# Patient Record
Sex: Female | Born: 1960 | Race: Black or African American | Hispanic: No | Marital: Married | Smoking: Former smoker
Health system: Southern US, Community
[De-identification: ages and names within clinical notes are randomized; demographics above are authoritative.]

## PROBLEM LIST (undated history)

## (undated) DIAGNOSIS — E8881 Metabolic syndrome: Secondary | ICD-10-CM

## (undated) DIAGNOSIS — Z923 Personal history of irradiation: Secondary | ICD-10-CM

## (undated) DIAGNOSIS — C50919 Malignant neoplasm of unspecified site of unspecified female breast: Secondary | ICD-10-CM

## (undated) DIAGNOSIS — I10 Essential (primary) hypertension: Secondary | ICD-10-CM

## (undated) DIAGNOSIS — Z9221 Personal history of antineoplastic chemotherapy: Secondary | ICD-10-CM

## (undated) DIAGNOSIS — F32A Depression, unspecified: Secondary | ICD-10-CM

## (undated) DIAGNOSIS — E669 Obesity, unspecified: Secondary | ICD-10-CM

## (undated) DIAGNOSIS — Z5189 Encounter for other specified aftercare: Secondary | ICD-10-CM

## (undated) DIAGNOSIS — F329 Major depressive disorder, single episode, unspecified: Secondary | ICD-10-CM

## (undated) DIAGNOSIS — E785 Hyperlipidemia, unspecified: Secondary | ICD-10-CM

## (undated) DIAGNOSIS — F419 Anxiety disorder, unspecified: Secondary | ICD-10-CM

## (undated) DIAGNOSIS — M1990 Unspecified osteoarthritis, unspecified site: Secondary | ICD-10-CM

## (undated) HISTORY — DX: Anxiety disorder, unspecified: F41.9

## (undated) HISTORY — DX: Major depressive disorder, single episode, unspecified: F32.9

## (undated) HISTORY — DX: Malignant neoplasm of unspecified site of unspecified female breast: C50.919

## (undated) HISTORY — DX: Depression, unspecified: F32.A

## (undated) HISTORY — PX: TONSILLECTOMY: SUR1361

## (undated) HISTORY — DX: Obesity, unspecified: E66.9

## (undated) HISTORY — DX: Metabolic syndrome: E88.810

## (undated) HISTORY — DX: Hyperlipidemia, unspecified: E78.5

## (undated) HISTORY — PX: CARPAL TUNNEL RELEASE: SHX101

## (undated) HISTORY — DX: Encounter for other specified aftercare: Z51.89

## (undated) HISTORY — DX: Metabolic syndrome: E88.81

## (undated) HISTORY — DX: Essential (primary) hypertension: I10

## (undated) HISTORY — DX: Unspecified osteoarthritis, unspecified site: M19.90

## (undated) HISTORY — PX: OTHER SURGICAL HISTORY: SHX169

---

## 1998-10-31 ENCOUNTER — Other Ambulatory Visit: Admission: RE | Admit: 1998-10-31 | Discharge: 1998-10-31 | Payer: Self-pay | Admitting: Obstetrics & Gynecology

## 2000-07-26 ENCOUNTER — Other Ambulatory Visit: Admission: RE | Admit: 2000-07-26 | Discharge: 2000-07-26 | Payer: Self-pay | Admitting: Obstetrics and Gynecology

## 2000-10-08 ENCOUNTER — Encounter (INDEPENDENT_AMBULATORY_CARE_PROVIDER_SITE_OTHER): Payer: Self-pay

## 2000-10-08 ENCOUNTER — Ambulatory Visit (HOSPITAL_COMMUNITY): Admission: RE | Admit: 2000-10-08 | Discharge: 2000-10-08 | Payer: Self-pay | Admitting: Obstetrics and Gynecology

## 2001-07-20 ENCOUNTER — Other Ambulatory Visit: Admission: RE | Admit: 2001-07-20 | Discharge: 2001-07-20 | Payer: Self-pay | Admitting: Obstetrics and Gynecology

## 2001-07-25 ENCOUNTER — Encounter: Payer: Self-pay | Admitting: Gastroenterology

## 2001-07-25 ENCOUNTER — Ambulatory Visit: Admission: RE | Admit: 2001-07-25 | Discharge: 2001-07-25 | Payer: Self-pay | Admitting: Gastroenterology

## 2001-09-08 ENCOUNTER — Encounter: Payer: Self-pay | Admitting: Obstetrics and Gynecology

## 2001-09-08 ENCOUNTER — Ambulatory Visit (HOSPITAL_COMMUNITY): Admission: RE | Admit: 2001-09-08 | Discharge: 2001-09-08 | Payer: Self-pay | Admitting: Obstetrics and Gynecology

## 2002-10-26 ENCOUNTER — Other Ambulatory Visit: Admission: RE | Admit: 2002-10-26 | Discharge: 2002-10-26 | Payer: Self-pay | Admitting: Obstetrics and Gynecology

## 2002-11-07 ENCOUNTER — Encounter (INDEPENDENT_AMBULATORY_CARE_PROVIDER_SITE_OTHER): Payer: Self-pay | Admitting: Specialist

## 2002-11-07 ENCOUNTER — Ambulatory Visit (HOSPITAL_COMMUNITY): Admission: RE | Admit: 2002-11-07 | Discharge: 2002-11-07 | Payer: Self-pay | Admitting: Gastroenterology

## 2002-12-28 DIAGNOSIS — IMO0001 Reserved for inherently not codable concepts without codable children: Secondary | ICD-10-CM

## 2002-12-28 DIAGNOSIS — Z5189 Encounter for other specified aftercare: Secondary | ICD-10-CM

## 2002-12-28 HISTORY — DX: Encounter for other specified aftercare: Z51.89

## 2002-12-28 HISTORY — DX: Reserved for inherently not codable concepts without codable children: IMO0001

## 2003-12-06 ENCOUNTER — Ambulatory Visit (HOSPITAL_COMMUNITY): Admission: RE | Admit: 2003-12-06 | Discharge: 2003-12-06 | Payer: Self-pay | Admitting: Obstetrics and Gynecology

## 2003-12-25 ENCOUNTER — Ambulatory Visit (HOSPITAL_COMMUNITY): Admission: RE | Admit: 2003-12-25 | Discharge: 2003-12-25 | Payer: Self-pay | Admitting: Obstetrics and Gynecology

## 2003-12-29 HISTORY — PX: TOTAL ABDOMINAL HYSTERECTOMY: SHX209

## 2004-01-14 ENCOUNTER — Observation Stay (HOSPITAL_COMMUNITY): Admission: AD | Admit: 2004-01-14 | Discharge: 2004-01-14 | Payer: Self-pay | Admitting: Obstetrics and Gynecology

## 2004-01-17 ENCOUNTER — Encounter (INDEPENDENT_AMBULATORY_CARE_PROVIDER_SITE_OTHER): Payer: Self-pay | Admitting: Specialist

## 2004-01-17 ENCOUNTER — Inpatient Hospital Stay (HOSPITAL_COMMUNITY): Admission: RE | Admit: 2004-01-17 | Discharge: 2004-01-20 | Payer: Self-pay | Admitting: Obstetrics and Gynecology

## 2004-03-13 ENCOUNTER — Ambulatory Visit (HOSPITAL_COMMUNITY): Admission: RE | Admit: 2004-03-13 | Discharge: 2004-03-13 | Payer: Self-pay | Admitting: Obstetrics and Gynecology

## 2004-03-28 ENCOUNTER — Ambulatory Visit (HOSPITAL_COMMUNITY): Admission: RE | Admit: 2004-03-28 | Discharge: 2004-03-28 | Payer: Self-pay | Admitting: Obstetrics and Gynecology

## 2004-05-13 ENCOUNTER — Ambulatory Visit (HOSPITAL_COMMUNITY): Admission: RE | Admit: 2004-05-13 | Discharge: 2004-05-13 | Payer: Self-pay | Admitting: Obstetrics and Gynecology

## 2005-02-20 ENCOUNTER — Ambulatory Visit (HOSPITAL_COMMUNITY): Admission: RE | Admit: 2005-02-20 | Discharge: 2005-02-20 | Payer: Self-pay | Admitting: Obstetrics and Gynecology

## 2005-02-20 ENCOUNTER — Ambulatory Visit: Payer: Self-pay | Admitting: Family Medicine

## 2005-08-05 ENCOUNTER — Ambulatory Visit: Payer: Self-pay | Admitting: Family Medicine

## 2006-01-29 ENCOUNTER — Ambulatory Visit: Payer: Self-pay | Admitting: Family Medicine

## 2006-03-05 ENCOUNTER — Ambulatory Visit (HOSPITAL_COMMUNITY): Admission: RE | Admit: 2006-03-05 | Discharge: 2006-03-05 | Payer: Self-pay | Admitting: Obstetrics and Gynecology

## 2006-04-21 ENCOUNTER — Ambulatory Visit: Payer: Self-pay | Admitting: Family Medicine

## 2006-04-27 ENCOUNTER — Ambulatory Visit: Payer: Self-pay | Admitting: Family Medicine

## 2006-05-25 ENCOUNTER — Ambulatory Visit: Payer: Self-pay | Admitting: Family Medicine

## 2006-12-15 ENCOUNTER — Ambulatory Visit: Payer: Self-pay | Admitting: Family Medicine

## 2007-03-21 ENCOUNTER — Ambulatory Visit (HOSPITAL_COMMUNITY): Admission: RE | Admit: 2007-03-21 | Discharge: 2007-03-21 | Payer: Self-pay | Admitting: Obstetrics and Gynecology

## 2007-05-18 DIAGNOSIS — R05 Cough: Secondary | ICD-10-CM

## 2007-05-18 DIAGNOSIS — R059 Cough, unspecified: Secondary | ICD-10-CM | POA: Insufficient documentation

## 2007-07-22 ENCOUNTER — Ambulatory Visit: Payer: Self-pay | Admitting: Family Medicine

## 2007-07-22 DIAGNOSIS — E8881 Metabolic syndrome: Secondary | ICD-10-CM | POA: Insufficient documentation

## 2007-07-22 DIAGNOSIS — I10 Essential (primary) hypertension: Secondary | ICD-10-CM | POA: Insufficient documentation

## 2007-11-23 ENCOUNTER — Ambulatory Visit: Payer: Self-pay | Admitting: Family Medicine

## 2007-11-23 ENCOUNTER — Other Ambulatory Visit: Admission: RE | Admit: 2007-11-23 | Discharge: 2007-11-23 | Payer: Self-pay | Admitting: Obstetrics and Gynecology

## 2007-11-23 LAB — CONVERTED CEMR LAB
ALT: 19 units/L (ref 0–35)
Alkaline Phosphatase: 69 units/L (ref 39–117)
BUN: 10 mg/dL (ref 6–23)
Basophils Relative: 0.7 % (ref 0.0–1.0)
CO2: 33 meq/L — ABNORMAL HIGH (ref 19–32)
Calcium: 10 mg/dL (ref 8.4–10.5)
Chloride: 102 meq/L (ref 96–112)
Cholesterol: 224 mg/dL (ref 0–200)
Creatinine, Ser: 0.8 mg/dL (ref 0.4–1.2)
HDL: 40 mg/dL (ref >39.0)
Hemoglobin: 13.2 g/dL (ref 12.0–15.0)
Monocytes Relative: 8.6 % (ref 3.0–11.0)
Platelets: 309 10*3/uL (ref 150–400)
RBC: 4.02 M/uL (ref 3.87–5.11)
RDW: 12.1 % (ref 11.5–14.6)
Total Bilirubin: 1.1 mg/dL (ref 0.3–1.2)
Total CHOL/HDL Ratio: 5.6
Total Protein: 7.1 g/dL (ref 6.0–8.3)
Urobilinogen, UA: 2
VLDL: 13 mg/dL (ref 0–40)

## 2007-12-02 ENCOUNTER — Ambulatory Visit: Payer: Self-pay | Admitting: Family Medicine

## 2008-05-08 ENCOUNTER — Ambulatory Visit (HOSPITAL_COMMUNITY): Admission: RE | Admit: 2008-05-08 | Discharge: 2008-05-08 | Payer: Self-pay | Admitting: Obstetrics and Gynecology

## 2008-07-24 ENCOUNTER — Ambulatory Visit: Payer: Self-pay | Admitting: Internal Medicine

## 2008-07-24 DIAGNOSIS — G4700 Insomnia, unspecified: Secondary | ICD-10-CM | POA: Insufficient documentation

## 2008-08-07 ENCOUNTER — Telehealth: Payer: Self-pay | Admitting: *Deleted

## 2008-08-23 ENCOUNTER — Ambulatory Visit: Payer: Self-pay | Admitting: Internal Medicine

## 2008-09-13 ENCOUNTER — Ambulatory Visit: Payer: Self-pay | Admitting: Family Medicine

## 2009-06-29 ENCOUNTER — Ambulatory Visit: Payer: Self-pay | Admitting: Family Medicine

## 2009-06-29 DIAGNOSIS — M25569 Pain in unspecified knee: Secondary | ICD-10-CM | POA: Insufficient documentation

## 2009-06-29 DIAGNOSIS — M25559 Pain in unspecified hip: Secondary | ICD-10-CM | POA: Insufficient documentation

## 2009-08-13 ENCOUNTER — Ambulatory Visit: Payer: Self-pay | Admitting: Family Medicine

## 2009-08-13 DIAGNOSIS — F419 Anxiety disorder, unspecified: Secondary | ICD-10-CM

## 2009-08-13 DIAGNOSIS — E785 Hyperlipidemia, unspecified: Secondary | ICD-10-CM | POA: Insufficient documentation

## 2009-08-13 DIAGNOSIS — F32A Depression, unspecified: Secondary | ICD-10-CM | POA: Insufficient documentation

## 2009-08-13 DIAGNOSIS — F329 Major depressive disorder, single episode, unspecified: Secondary | ICD-10-CM | POA: Insufficient documentation

## 2009-08-14 LAB — CONVERTED CEMR LAB
Albumin: 4 g/dL (ref 3.5–5.2)
Alkaline Phosphatase: 72 units/L (ref 39–117)
BUN: 9 mg/dL (ref 6–23)
Basophils Relative: 0.6 % (ref 0.0–3.0)
Bilirubin, Direct: 0.1 mg/dL (ref 0.0–0.3)
CO2: 33 meq/L — ABNORMAL HIGH (ref 19–32)
Chloride: 103 meq/L (ref 96–112)
Creatinine, Ser: 0.7 mg/dL (ref 0.4–1.2)
Direct LDL: 175 mg/dL
Eosinophils Absolute: 0.1 10*3/uL (ref 0.0–0.7)
Eosinophils Relative: 1.9 % (ref 0.0–5.0)
HCT: 39.1 % (ref 36.0–46.0)
HDL: 42.9 mg/dL (ref >39.00)
Lymphs Abs: 2.4 10*3/uL (ref 0.7–4.0)
MCHC: 34.4 g/dL (ref 30.0–36.0)
MCV: 92.4 fL (ref 78.0–100.0)
Monocytes Absolute: 0.4 10*3/uL (ref 0.1–1.0)
Neutro Abs: 2.9 10*3/uL (ref 1.4–7.7)
Neutrophils Relative %: 47.9 % (ref 43.0–77.0)
Potassium: 4.6 meq/L (ref 3.5–5.1)
RBC: 4.23 M/uL (ref 3.87–5.11)
Total CHOL/HDL Ratio: 5
VLDL: 22.2 mg/dL (ref 0.0–40.0)

## 2009-08-15 ENCOUNTER — Telehealth (INDEPENDENT_AMBULATORY_CARE_PROVIDER_SITE_OTHER): Payer: Self-pay | Admitting: *Deleted

## 2009-09-05 ENCOUNTER — Telehealth (INDEPENDENT_AMBULATORY_CARE_PROVIDER_SITE_OTHER): Payer: Self-pay | Admitting: *Deleted

## 2009-11-07 ENCOUNTER — Ambulatory Visit: Payer: Self-pay | Admitting: Family Medicine

## 2009-11-08 ENCOUNTER — Telehealth (INDEPENDENT_AMBULATORY_CARE_PROVIDER_SITE_OTHER): Payer: Self-pay | Admitting: *Deleted

## 2009-12-06 ENCOUNTER — Ambulatory Visit: Payer: Self-pay | Admitting: Family Medicine

## 2010-01-10 ENCOUNTER — Encounter: Payer: Self-pay | Admitting: Family Medicine

## 2010-01-11 ENCOUNTER — Ambulatory Visit: Payer: Self-pay | Admitting: Internal Medicine

## 2010-06-24 ENCOUNTER — Ambulatory Visit: Payer: Self-pay | Admitting: Family Medicine

## 2010-07-24 ENCOUNTER — Ambulatory Visit: Payer: Self-pay | Admitting: Family Medicine

## 2010-07-29 ENCOUNTER — Telehealth (INDEPENDENT_AMBULATORY_CARE_PROVIDER_SITE_OTHER): Payer: Self-pay | Admitting: *Deleted

## 2010-08-14 ENCOUNTER — Telehealth (INDEPENDENT_AMBULATORY_CARE_PROVIDER_SITE_OTHER): Payer: Self-pay | Admitting: *Deleted

## 2010-12-28 DIAGNOSIS — C50919 Malignant neoplasm of unspecified site of unspecified female breast: Secondary | ICD-10-CM

## 2010-12-28 HISTORY — DX: Malignant neoplasm of unspecified site of unspecified female breast: C50.919

## 2010-12-28 HISTORY — PX: BREAST LUMPECTOMY: SHX2

## 2010-12-30 ENCOUNTER — Ambulatory Visit
Admission: RE | Admit: 2010-12-30 | Discharge: 2010-12-30 | Payer: Self-pay | Source: Home / Self Care | Attending: Family Medicine | Admitting: Family Medicine

## 2010-12-30 ENCOUNTER — Ambulatory Visit (HOSPITAL_COMMUNITY)
Admission: RE | Admit: 2010-12-30 | Discharge: 2010-12-30 | Payer: Self-pay | Source: Home / Self Care | Attending: Obstetrics and Gynecology | Admitting: Obstetrics and Gynecology

## 2010-12-30 DIAGNOSIS — E559 Vitamin D deficiency, unspecified: Secondary | ICD-10-CM | POA: Insufficient documentation

## 2011-01-01 ENCOUNTER — Ambulatory Visit
Admission: RE | Admit: 2011-01-01 | Discharge: 2011-01-01 | Payer: Self-pay | Source: Home / Self Care | Attending: Family Medicine | Admitting: Family Medicine

## 2011-01-02 ENCOUNTER — Encounter (INDEPENDENT_AMBULATORY_CARE_PROVIDER_SITE_OTHER): Payer: Self-pay | Admitting: *Deleted

## 2011-01-02 ENCOUNTER — Encounter: Payer: Self-pay | Admitting: Family Medicine

## 2011-01-02 LAB — CONVERTED CEMR LAB
ALT: 12 units/L
AST: 12 units/L
Alkaline Phosphatase: 69 units/L
CO2, serum: 26 mmol/L
Chloride, Serum: 101 mmol/L
Globulin: 3.3 g/dL
Potassium, serum: 4.1 mmol/L
Total Bilirubin: 0.5 mg/dL
Total Protein: 7.6 g/dL

## 2011-01-06 ENCOUNTER — Encounter: Payer: Self-pay | Admitting: Family Medicine

## 2011-01-06 ENCOUNTER — Encounter
Admission: RE | Admit: 2011-01-06 | Discharge: 2011-01-06 | Payer: Self-pay | Source: Home / Self Care | Attending: Obstetrics and Gynecology | Admitting: Obstetrics and Gynecology

## 2011-01-06 ENCOUNTER — Encounter (INDEPENDENT_AMBULATORY_CARE_PROVIDER_SITE_OTHER): Payer: Self-pay | Admitting: *Deleted

## 2011-01-06 LAB — HM MAMMOGRAPHY: HM Mammogram: ABNORMAL

## 2011-01-09 ENCOUNTER — Encounter: Payer: Self-pay | Admitting: Family Medicine

## 2011-01-12 ENCOUNTER — Encounter
Admission: RE | Admit: 2011-01-12 | Discharge: 2011-01-12 | Payer: Self-pay | Source: Home / Self Care | Attending: Obstetrics and Gynecology | Admitting: Obstetrics and Gynecology

## 2011-01-14 ENCOUNTER — Ambulatory Visit
Admission: RE | Admit: 2011-01-14 | Discharge: 2011-01-14 | Payer: Self-pay | Source: Home / Self Care | Attending: Pulmonary Disease | Admitting: Pulmonary Disease

## 2011-01-14 DIAGNOSIS — C50512 Malignant neoplasm of lower-outer quadrant of left female breast: Secondary | ICD-10-CM | POA: Insufficient documentation

## 2011-01-16 ENCOUNTER — Ambulatory Visit: Payer: Self-pay | Admitting: Genetic Counselor

## 2011-01-18 ENCOUNTER — Encounter: Payer: Self-pay | Admitting: Obstetrics and Gynecology

## 2011-01-19 ENCOUNTER — Encounter: Payer: Self-pay | Admitting: Pulmonary Disease

## 2011-01-19 ENCOUNTER — Ambulatory Visit (HOSPITAL_BASED_OUTPATIENT_CLINIC_OR_DEPARTMENT_OTHER)
Admission: RE | Admit: 2011-01-19 | Discharge: 2011-01-19 | Payer: Self-pay | Source: Home / Self Care | Attending: Pulmonary Disease | Admitting: Pulmonary Disease

## 2011-01-19 LAB — POCT I-STAT, CHEM 8
BUN: 10 mg/dL (ref 6–23)
Calcium, Ion: 1.21 mmol/L (ref 1.12–1.32)
Creatinine, Ser: 0.8 mg/dL (ref 0.4–1.2)
Sodium: 140 mEq/L (ref 135–145)
TCO2: 32 mmol/L (ref 0–100)

## 2011-01-20 ENCOUNTER — Ambulatory Visit
Admission: RE | Admit: 2011-01-20 | Discharge: 2011-01-20 | Payer: Self-pay | Source: Home / Self Care | Attending: General Surgery | Admitting: General Surgery

## 2011-01-20 ENCOUNTER — Encounter: Payer: Self-pay | Admitting: Family Medicine

## 2011-01-20 ENCOUNTER — Encounter
Admission: RE | Admit: 2011-01-20 | Discharge: 2011-01-20 | Payer: Self-pay | Source: Home / Self Care | Attending: General Surgery | Admitting: General Surgery

## 2011-01-25 LAB — CONVERTED CEMR LAB
ALT: 14 units/L (ref 0–35)
AST: 14 units/L (ref 0–37)
Albumin: 3.8 g/dL (ref 3.5–5.2)
Alkaline Phosphatase: 63 units/L (ref 39–117)
BUN: 11 mg/dL (ref 6–23)
Basophils Absolute: 0 10*3/uL (ref 0.0–0.1)
Basophils Relative: 0.7 % (ref 0.0–3.0)
Bilirubin, Direct: 0.2 mg/dL (ref 0.0–0.3)
CO2: 28 meq/L (ref 19–32)
Calcium: 8.6 mg/dL (ref 8.4–10.5)
Chloride: 97 meq/L (ref 96–112)
Cholesterol: 193 mg/dL (ref 0–200)
Creatinine, Ser: 0.7 mg/dL (ref 0.4–1.2)
Eosinophils Absolute: 0.1 10*3/uL (ref 0.0–0.7)
Eosinophils Relative: 2.2 % (ref 0.0–5.0)
GFR calc non Af Amer: 118.71 mL/min (ref >60)
Glucose, Bld: 88 mg/dL (ref 70–99)
HCT: 36.6 % (ref 36.0–46.0)
HDL: 40.6 mg/dL (ref >39.00)
Hemoglobin: 12.7 g/dL (ref 12.0–15.0)
LDL Cholesterol: 139 mg/dL — ABNORMAL HIGH (ref 0–99)
Lymphocytes Relative: 40.4 % (ref 12.0–46.0)
Lymphs Abs: 2 10*3/uL (ref 0.7–4.0)
MCHC: 34.7 g/dL (ref 30.0–36.0)
MCV: 93.6 fL (ref 78.0–100.0)
Monocytes Absolute: 0.4 10*3/uL (ref 0.1–1.0)
Monocytes Relative: 7.5 % (ref 3.0–12.0)
Neutro Abs: 2.4 10*3/uL (ref 1.4–7.7)
Neutrophils Relative %: 49.2 % (ref 43.0–77.0)
Platelets: 276 10*3/uL (ref 150.0–400.0)
Potassium: 4 meq/L (ref 3.5–5.1)
RBC: 3.91 M/uL (ref 3.87–5.11)
RDW: 13.1 % (ref 11.5–14.6)
Sodium: 137 meq/L (ref 135–145)
TSH: 0.82 microintl units/mL (ref 0.35–5.50)
Total Bilirubin: 0.8 mg/dL (ref 0.3–1.2)
Total CHOL/HDL Ratio: 5
Total Protein: 7.3 g/dL (ref 6.0–8.3)
Triglycerides: 69 mg/dL (ref 0.0–149.0)
VLDL: 13.8 mg/dL (ref 0.0–40.0)
Vit D, 25-Hydroxy: 26 ng/mL — ABNORMAL LOW (ref 30–89)
WBC: 4.9 10*3/uL (ref 4.5–10.5)

## 2011-01-26 NOTE — Op Note (Signed)
[name], [name]                 ACCOUNT NO.:  [id number]  MEDICAL RECORD NO.:  [id number]          PATIENT TYPE:  AMB  LOCATION:  DSC                          FACILITY:  [facility]  PHYSICIAN:  Sharlet Salina T. Mccartney Chuba, M.D.DATE OF BIRTH:  05-Aug-1961  DATE OF PROCEDURE:  01/20/2011 DATE OF DISCHARGE:                              OPERATIVE REPORT   PREOPERATIVE DIAGNOSIS:  Cancer of the left breast.  POSTOPERATIVE DIAGNOSIS:  Cancer of the left breast.  SURGICAL PROCEDURES: 1. Blue dye injection, left breast. 2. Left axillary sentinel lymph node biopsy. 3. Needle-localized left breast lumpectomy.  SURGEON:  Lorne Skeens. Adrinne Sze, MD  ANESTHESIA:  General.  BRIEF HISTORY:  Ms. [name] is a 50 year old female who on a recent screening mammogram was found to have a new approximately 1-cm mass in the upper left breast.  Subsequent ultrasound and core-guided biopsy showed an approximately 1-cm well-differentiated invasive lobular carcinoma.  MRI has confirmed these findings with no other abnormalities.  After extensive discussion of initial surgical treatment options in the office, we have elected proceed with breast conservation with needle-localized lumpectomy and left axillary sentinel lymph node biopsy.  Ashby Dawes of the procedures, indications, risks of bleeding, infection, anesthetic complications, possible need for further surgery based on final pathology, findings have been discussed and understood. She is now brought to the operating room for these procedures.  DESCRIPTION OF OPERATION:  Preoperatively, the patient underwent needle localization, which included one wire placed through the lesion and another wire placed at the site of the marking clip, which had migrated and was about 2 cm anterior to the lesion.  About half an hour preoperatively, she underwent injection of 1 mCi of technetium sulfur colloid intradermally around the left nipple.  The patient was then taken to the  operating room and laryngeal mask general anesthesia was induced.  The left breast was sterilely prepped and after procedure time- out, 5 mL of dilute methylene blue was injected subcutaneously beneath the left nipple massaged.  Following this, the entire left chest, axilla, upper arm were widely sterilely prepped and draped.  The patient received preoperative IV antibiotics and PAS were in place.  The lumpectomy was approached initially.  I made a curvilinear incision just beneath the wire entrance site and dissection was carried down into the deep subcutaneous tissue.  The wire for the mass was then brought into the lumpectomy incision, and a generous core of tissue was excised around the shaft of this wire.  As I worked somewhat more inferiorly, the wire and marking clip that have placed more anterior and inferior was included in the very anterior portion of the specimen as well.  I then came across the tip of the wire posteriorly.  Grossly, the lesion was completely excised, although there was a palpable firm mass that was relatively close to the junction of the superior and posterior margin of the specimen.  The margin was inked and oriented, and specimen of mammogram showed both wires, the marking clip, and the lesion within the specimen.  I then excised a further margin of at least 5 mm completely re-excising the superior and  posterior margins as one intact piece.  The new margin was oriented, and this was sent for permanent section as well.  Hemostasis obtained of this wound with cautery, and the soft tissue was injected with Marcaine.  Gloves were changed.  The NeoProbe was used to localize a hot area in the left axilla, and a small transverse incision made.  Dissection was carried down through subcutaneous tissue and the clavipectoral fascia was incised.  Using NeoProbe for guidance, I dissected down onto a bright blue lymph node pretty slightly enlarged otherwise appearing  normal.  It was completely excised with cautery and ex vivo had counts of the order of 7000 with background in the axilla of around 50.  There were no other palpable nodes or abnormalities.  This was sent for permanent section as hot blue left axillary sentinel lymph node.  The soft tissue was also infiltrated with Marcaine and hemostasis was assured.  The breast tissue was then reapproximated with interrupted Vicryls, the subcu with interrupted 3-0 Vicryls in both incisions, and both incisions closed with subcuticular Monocryl and Dermabond.  Sponge and needle counts were correct.  The patient was taken to recovery in good condition.     Lorne Skeens. Kaelee Pfeffer, M.D.     Tory Emerald  D:  01/20/2011  T:  01/21/2011  Job:  [id number]  Electronically Signed by [name] M.D. on 01/26/2011 02:03:19 PM

## 2011-01-27 NOTE — Assessment & Plan Note (Signed)
Summary: COUGH/HX BRONCHITIS...AS.   Vital Signs:  Patient profile:   50 year old female Weight:      245 pounds O2 Sat:      97 % on Room air Temp:     99.1 degrees F oral Pulse rate:   84 / minute Resp:     18 per minute BP sitting:   110 / 70  (left arm)  Vitals Entered By: Doristine Devoid (January 11, 2010 10:04 AM)  O2 Flow:  Room air CC: still w/ cough    History of Present Illness: Had bronchitis last month Rx'd by Dr Laury Axon Then needed to be seen at urgent care around Christmas had x-ray without pneumonia (had ear pain and fever, cough) diagnosed with bronchitis and OM at that time was given levaquin then  Did feel better after about 3 days cough better but didn't completely resolve  Hasn't been febrile some tightness in chest but no sig SOB  ears are better no sore throat  Allergies: No Known Drug Allergies  Past History:  Past medical, surgical, family and social histories (including risk factors) reviewed for relevance to current acute and chronic problems.  Past Medical History: Reviewed history from 08/13/2009 and no changes required. total abdominal hysterectomy in 2005-not for cancer. metabolic syndrome with overweight, hypertension, and hyperlipidemia. childbirth x 2 BTL Hyperlipidemia  Past Surgical History: Reviewed history from 08/13/2009 and no changes required. TAH no cancer BTL  Family History: Reviewed history from 12/02/2007 and no changes required. Family History of Alcoholism/Addiction Family History of Asthma Family History of CAD Female 1st degree relative <60 Family History High cholesterol Family History Hypertension  Social History: Reviewed history from 08/13/2009 and no changes required. 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  NAD Head:  no sig sinus tenderness Ears:  R ear normal and L ear normal.   Nose:  mild congestion Mouth:  mild injection without exudate Neck:  supple, no masses, and no cervical lymphadenopathy.   Lungs:  normal respiratory effort, no intercostal retractions, no accessory muscle use, normal breath sounds, no crackles, and no wheezes.     Impression & Recommendations:  Problem # 1:  BRONCHITIS- ACUTE (ICD-466.0) Assessment Comment Only persistence suggests atypical infection like Mycoplasma or Chlamydia may get some further symptom relief from antibiotic but not clear cut  will Rx tramadol for cough another z-pak if worsening  Complete Medication List: 1)  Tenoretic 50 50-25 Mg Tabs (Atenolol-chlorthalidone) .... 1/2 tab once daily 2)  Lasix 40 Mg Tabs (Furosemide) .... Take 1 tablet by mouth once a day in the morning 3)  Zocor 80 Mg Tabs (Simvastatin) .... One tab at bedtime 4)  Celexa 20 Mg Tabs (Citalopram hydrobromide) .... Take 2 tabs in the morning 5)  Klonopin 1 Mg Tabs (Clonazepam) .Marland Kitchen.. 1 tab at bedtime 6)  Tramadol Hcl 50 Mg Tabs (Tramadol hcl) .Marland Kitchen.. 1 tab by mouth three times a day as needed for cough 7)  Azithromycin 250 Mg Tabs (Azithromycin) .... 2 on the first day and 1 on each of next four days for persistent bronchitis  Patient Instructions: 1)  Please schedule a follow-up appointment as needed .  2)  Use the antibiotic if you are worsening again  instead of continued slow improvement Prescriptions: AZITHROMYCIN 250 MG TABS (AZITHROMYCIN) 2 on the first day and 1 on each of next four days for persistent bronchitis  #6 x 0   Entered and Authorized by:   Cindee Salt MD   Signed by:   Cindee Salt MD on 01/11/2010   Method used:   Print then Give to Patient   RxID:   [id number] TRAMADOL HCL 50 MG TABS (TRAMADOL HCL) 1 tab by mouth three times a day as needed for cough  #30 x 0    Entered and Authorized by:   Cindee Salt MD   Signed by:   Cindee Salt MD on 01/11/2010   Method used:   [facility]  [address] [phone][facility]ail[facility]       [address]       [place], [place]  [address]       Ph: [phone] or [phone]       Fax: [phone]   RxID:   [id number]

## 2011-01-27 NOTE — Letter (Signed)
Summary: Call a Nurse  Call a Nurse   Imported By: Lanelle Bal 01/22/2010 14:31:01  _____________________________________________________________________  External Attachment:    Type:   Image     Comment:   External Document

## 2011-01-27 NOTE — Assessment & Plan Note (Signed)
Summary: DISCUSS SLEEPING MED/KN   Vital Signs:  Patient profile:   50 year old female Weight:      246 pounds Pulse rate:   74 / minute BP sitting:   132 / 84  (left arm)  Vitals Entered By: Doristine Devoid (June 24, 2010 11:14 AM) CC: problems w/ sleep getting about 2-3 hours    History of Present Illness: 50 yo woman here today to again discuss insomnia.  'Ambien worked for a week and then didn't work anymore'.  has also been on Klonopin (thinks this worked well) and Trazodone- left her w/ med hangover.  taking Celexa 20mg  daily.  no trouble falling asleep- goes to bed at 11.  wakes after 2-3 hrs.  reports once she wakes up her mind is 'constantly racing'.  she has asked husband about apnic episodes and he denies this.  has never had sleep study.  Current Medications (verified): 1)  Tenoretic 50 50-25 Mg  Tabs (Atenolol-Chlorthalidone) .... 1/2 Tab Once Daily 2)  Lasix 40 Mg  Tabs (Furosemide) .... Take 1 Tablet By Mouth Once A Day in The Morning 3)  Simvastatin 40 Mg Tabs (Simvastatin) .Marland Kitchen.. 1 Tab By Mouth Nightly. 4)  Celexa 20 Mg  Tabs (Citalopram Hydrobromide) .... Take 1 Tab in The Morning 5)  Klonopin 1 Mg Tabs (Clonazepam) .Marland Kitchen.. 1 Tab At Bedtime 6)  Tramadol Hcl 50 Mg Tabs (Tramadol Hcl) .Marland Kitchen.. 1 Tab By Mouth Three Times A Day As Needed For Cough 7)  Azithromycin 250 Mg Tabs (Azithromycin) .... 2 On The First Day and 1 On Each of Next Four Days For Persistent Bronchitis  Allergies (verified): No Known Drug Allergies  Review of Systems      See HPI  Physical Exam  General:  alert.  NAD Psych:  Cognition and judgment appear intact. Alert and cooperative with normal attention span and concentration. No apparent delusions, illusions, hallucinations   Impression & Recommendations:  Problem # 1:  INSOMNIA (ICD-780.52) Assessment Unchanged pt again struggling.  will restart klonopin as pt feels this worked well for her in the past but can't remember why she stopped taking it.   offered silenor but pt prefers to re-try klonopin since she knows she did not have adverse rxn.  pt to call and report on sxs and if no improvement will switch to silenor.  Pt expresses understanding and is in agreement w/ this plan.  Complete Medication List: 1)  Tenoretic 50 50-25 Mg Tabs (Atenolol-chlorthalidone) .... 1/2 tab once daily 2)  Lasix 40 Mg Tabs (Furosemide) .... Take 1 tablet by mouth once a day in the morning 3)  Simvastatin 40 Mg Tabs (Simvastatin) .Marland Kitchen.. 1 tab by mouth nightly. 4)  Celexa 20 Mg Tabs (Citalopram hydrobromide) .... Take 1 tab in the morning 5)  Klonopin 1 Mg Tabs (Clonazepam) .Marland Kitchen.. 1 tab at bedtime 6)  Tramadol Hcl 50 Mg Tabs (Tramadol hcl) .Marland Kitchen.. 1 tab by mouth three times a day as needed for cough 7)  Azithromycin 250 Mg Tabs (Azithromycin) .... 2 on the first day and 1 on each of next four days for persistent bronchitis  Patient Instructions: 1)  Take the Klonopin nightly 2)  Call me in 1-2 weeks if no improvement 3)  Call with any questions or concerns 4)  Hang in there! Prescriptions: SIMVASTATIN 40 MG TABS (SIMVASTATIN) 1 tab by mouth nightly.  #30 x 3   Entered and Authorized by:   [name] MD   Signed by:   Natalia Leatherwood  Beverely Low MD on 06/24/2010   Method used:   [facility]        [facility]  [address] [phone][facility] (retail)       [address]       [place], [place]  [id number]       Ph: [phone] or [phone]       Fax: [phone]   RxID:   [id number] KLONOPIN 1 MG TABS (CLONAZEPAM) 1 tab at bedtime  #30 x 0   Entered and Authorized by:   [name] MD   Signed by:   [name] MD on 06/24/2010   Method used:   Print then Give to Patient   RxID:   [id number]

## 2011-01-27 NOTE — Assessment & Plan Note (Signed)
Summary: CPX/FASTING//KN   Vital Signs:  Patient profile:   50 year old female Height:      62.75 inches Weight:      241 pounds BMI:     43.19 Pulse rate:   66 / minute BP sitting:   110 / 68  (left arm)  Vitals Entered By: Doristine Devoid CMA (July 24, 2010 8:45 AM) CC: CPX AND LABS   History of Present Illness: 50 yo woman here today for CPE.  GYN- Cousins.  Overdue for Mammogram- goes to [facility].  no concerns about health.  HTN- BP well controlled on current meds.  asymptomatic.  Hyperlipidemia- tolerating statin w/out difficulty.  not watching diet or getting regular exercise.  Preventive Screening-Counseling & Management  Alcohol-Tobacco     Alcohol drinks/day: 0     Smoking Status: never  Caffeine-Diet-Exercise     Does Patient Exercise: yes     Type of exercise: walking     Times/week: <3      Sexual History:  currently monogamous.        Drug Use:  never.    Current Medications (verified): 1)  Tenoretic 50 50-25 Mg  Tabs (Atenolol-Chlorthalidone) .... 1/2 Tab Once Daily 2)  Lasix 40 Mg  Tabs (Furosemide) .... Take 1 Tablet By Mouth Once A Day in The Morning 3)  Simvastatin 40 Mg Tabs (Simvastatin) .Marland Kitchen.. 1 Tab By Mouth Nightly. 4)  Celexa 20 Mg  Tabs (Citalopram Hydrobromide) .... Take 1 Tab in The Morning 5)  Klonopin 1 Mg Tabs (Clonazepam) .Marland Kitchen.. 1 Tab At Bedtime  Allergies (verified): No Known Drug Allergies  Past History:  Past Medical History: Last updated: 08/13/2009 total abdominal hysterectomy in 2005-not for cancer. metabolic syndrome with overweight, hypertension, and hyperlipidemia. 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    Physical Exam  General:  alert.  NAD Head:  Normocephalic and atraumatic without obvious abnormalities. No apparent alopecia or balding. Eyes:  PERRL, EOMI, fundi WNL Ears:  External ear exam shows no significant lesions or deformities.  Otoscopic examination reveals clear canals, tympanic membranes are intact bilaterally without bulging, retraction, inflammation or discharge. Hearing is grossly normal bilaterally. Nose:  External nasal examination shows no deformity or inflammation. Nasal mucosa are pink and moist without lesions or exudates. Mouth:  Oral mucosa and oropharynx without lesions or exudates.  Teeth in good repair. Neck:  No deformities, masses, or tenderness noted. Breasts:  deferred to GYN Lungs:  normal respiratory effort, no intercostal retractions, no accessory muscle use, normal breath sounds, no crackles, and no wheezes.   Heart:  normal rate and no murmur.   Abdomen:  Bowel sounds positive,abdomen soft and non-tender without masses, organomegaly or hernias noted. Genitalia:  deferred to GYN  Pulses:  +2 carotid, radial, DP Extremities:  No clubbing, cyanosis, edema, or deformity noted with normal full range of motion of all joints.   Neurologic:  No cranial nerve deficits noted. Station and gait are normal. Plantar reflexes are  down-going bilaterally. DTRs are symmetrical throughout. Sensory, motor and coordinative functions appear intact. Skin:  Intact without suspicious lesions or rashes Cervical Nodes:  No lymphadenopathy noted Axillary Nodes:  No palpable lymphadenopathy Psych:  Cognition and judgment appear intact. Alert and cooperative with normal attention span and concentration. No apparent delusions, illusions, hallucinations   Impression & Recommendations:  Problem # 1:  PHYSICAL EXAMINATION (ICD-V70.0) Assessment New pt's PE WNL w/ exception of obesity.  strongly encouraged healthy diet and regular exercise.  pt to schedule a mammogram.  anticipatory guidance provided. Orders: TLB-TSH (Thyroid Stimulating Hormone) (84443-TSH) T-Vitamin D (25-Hydroxy) (36644-03474)  Problem # 2:  HYPERTENSION (ICD-401.9) Assessment: Unchanged excellent control.  check labs. Her updated medication list for this problem includes:    Tenoretic 50 50-25 Mg Tabs (Atenolol-chlorthalidone) .Marland Kitchen... 1/2 tab once daily    Lasix 40 Mg Tabs (Furosemide) .Marland Kitchen... Take 1 tablet by mouth once a day in the morning  Orders: TLB-BMP (Basic Metabolic Panel-BMET) (80048-METABOL) TLB-CBC Platelet - w/Differential (85025-CBCD) EKG w/ Interpretation (93000)  Problem # 3:  HYPERLIPIDEMIA (ICD-272.4) Assessment: Unchanged due for labs today.  adjust meds as needed. Her updated medication list for this problem includes:    Simvastatin 40 Mg Tabs (Simvastatin) .Marland Kitchen... 1 tab by mouth nightly.  Orders: Venipuncture (25956) TLB-Lipid Panel (80061-LIPID) TLB-Hepatic/Liver Function Pnl (80076-HEPATIC)  Complete Medication List: 1)  Tenoretic 50 50-25 Mg Tabs (Atenolol-chlorthalidone) .... 1/2 tab once daily 2)  Lasix 40 Mg Tabs (Furosemide) .... Take 1 tablet by mouth once a day in the morning 3)  Simvastatin 40 Mg Tabs (Simvastatin) .Marland Kitchen.. 1 tab by mouth nightly. 4)  Celexa 20 Mg Tabs (Citalopram hydrobromide) .... Take 1 tab in the  morning 5)  Klonopin 1 Mg Tabs (Clonazepam) .Marland Kitchen.. 1 tab at bedtime  Patient Instructions: 1)  Follow up in 6 months to recheck your cholesterol and blood pressure 2)  Try and get regular exercise- goal is 4x/week for 30 minutes 3)  Try and make healthy food choices 4)  Schedule your mammogram 5)  Call with any questions or concerns 6)  Hang in there! Prescriptions: SIMVASTATIN 40 MG TABS (SIMVASTATIN) 1 tab by mouth nightly.  #90 x 3   Entered and Authorized by:   [name] MD   Signed by:   [name] MD on 07/24/2010   Method used:   Print then Give to Patient   RxID:   [id number] TENORETIC 50 50-25 MG  TABS (ATENOLOL-CHLORTHALIDONE) 1/2 tab once daily  #45 x 3   Entered and Authorized by:   [name] MD   Signed by:   [name] MD on 07/24/2010   Method used:   Print then Give to Patient   RxID:   [id number] LASIX 40 MG  TABS (FUROSEMIDE) Take 1 tablet by mouth once a day in the morning  #90 x 3   Entered and Authorized by:   [name] MD   Signed by:   [name] MD on 07/24/2010   Method used:   Print then Give to Patient   RxID:   [id number] KLONOPIN 1 MG TABS (CLONAZEPAM) 1 tab at bedtime  #30 x 3   Entered and Authorized by:   [name] MD   Signed by:  [name] MD on 07/24/2010   Method used:   Print then Give to Patient   RxID:   [id number]

## 2011-01-27 NOTE — Progress Notes (Signed)
Summary: tenoretic refill   Phone Note Refill Request Message from:  Fax from Pharmacy on August 14, 2010 9:46 AM  Refills Requested: Medication #1:  TENORETIC 50 50-25 MG  TABS 1/2 tab once daily [facility] [address] - fax [phone]  Initial call taken by: Okey Regal [place],  August 14, 2010 9:53 AM    Prescriptions: TENORETIC 50 50-25 MG  TABS (ATENOLOL-CHLORTHALIDONE) 1/2 tab once daily  #45 x 3   Entered by:   Doristine Devoid CMA   Authorized by:   [name] MD   Signed by:   Doristine Devoid CMA on 08/14/2010   Method used:   [facility]        [facility]  [address] [phone]* (retail)       [address]       [place], [place]  [address]       Ph: [phone] or [phone]       Fax: [phone]   RxID:   [id number]

## 2011-01-27 NOTE — Progress Notes (Signed)
Summary: labs  Phone Note Outgoing Call   Call placed by: Doristine Devoid CMA,  July 29, 2010 9:44 AM Call placed to: Patient Summary of Call: labs look great w/ exception of cholesterol- still mildly elevated.  should switch to lipitor 40mg  at bedtime.  pt's level low- start 50,000 units of Vit D weekly x8 weeks and then repeat level   Follow-up for Phone Call        left message on machine ............Marland KitchenDoristine Devoid CMA  July 29, 2010 9:44 AM   Additional Follow-up for Phone Call Additional follow up Details #1::        [facility] [address]  Additional Follow-up by: Harold Barban,  August 01, 2010 1:37 PM    New/Updated Medications: LIPITOR 40 MG TABS (ATORVASTATIN CALCIUM) take one tablet at bedtime VITAMIN D (ERGOCALCIFEROL) 50000 UNIT CAPS (ERGOCALCIFEROL) take one tablet weekly x8 weeks Prescriptions: VITAMIN D (ERGOCALCIFEROL) 50000 UNIT CAPS (ERGOCALCIFEROL) take one tablet weekly x8 weeks  #8 x 0   Entered by:   Doristine Devoid CMA   Authorized by:   [name] MD   Signed by:   Doristine Devoid CMA on 08/02/2010   Method used:   [facility]        [facility]  [address] [phone]* (retail)       [address]       [place], [place]  [address]       Ph: [phone] or [phone]       Fax: [phone]   RxID:   [id number] LIPITOR 40 MG TABS (ATORVASTATIN CALCIUM) take one tablet at bedtime  #30 x 6   Entered by:   Doristine Devoid CMA   Authorized by:   [name] MD   Signed by:   Doristine Devoid CMA on 08/02/2010   Method used:   [facility]        [facility]  [address] [phone]* (retail)       [address]       [place], [place]  [address]       Ph: [phone] or [phone]       Fax: [phone]   RxID:   [id number]

## 2011-01-29 NOTE — Miscellaneous (Signed)
Summary: labs  Clinical Lists Changes  Observations: Added new observation of VIT D 25-OH: 26 ng/mL (01/02/2011 13:51) Added new observation of TRIGLYC TOT: 71 mg/dL (81/19/1478 29:56) Added new observation of LDL: 116 mg/dL (21/30/8657 84:69) Added new observation of HDL: 49 mg/dL (62/95/2841 32:44) Added new observation of CHOLESTEROL: 179 mg/dL (12/30/7251 66:44) Added new observation of BILI TOTAL: 0.5 mg/dL (03/47/4259 56:38) Added new observation of ALK PHOS: 69 units/L (01/02/2011 13:51) Added new observation of SGPT (ALT): 12 units/L (01/02/2011 13:51) Added new observation of SGOT (AST): 12 units/L (01/02/2011 13:51) Added new observation of PROTEIN, TOT: 7.6 g/dL (75/64/3329 51:88) Added new observation of GLOBULIN TOT: 3.3 g/dL (41/66/0630 16:01) Added new observation of ALBUMIN: 4.3 g/dL (09/32/3557 32:20) Added new observation of CALCIUM: 9.3 mg/dL (25/42/7062 37:62) Added new observation of GLUCOSE SER: 79 mg/dL (83/15/1761 60:73) Added new observation of CREATININE: 0.68 mg/dL (71/05/2693 85:46) Added new observation of BUN: 7 mg/dL (27/02/5008 38:18) Added new observation of CO2 TOTAL: 26 mmol/L (01/02/2011 13:51) Added new observation of CHLORIDE: 101 mmol/L (01/02/2011 13:51) Added new observation of POTASSIUM: 4.1 mmol/L (01/02/2011 13:51)

## 2011-01-29 NOTE — Assessment & Plan Note (Signed)
Summary: FOLLOWUP FROM MONTHS AGO; ALSO TALK ABOUT SLEEP APNEA//SPH   Vital Signs:  Patient profile:   50 year old female Weight:      248 pounds BMI:     44.44 Pulse rate:   88 / minute BP sitting:   120 / 72  (left arm)  Vitals Entered By: Doristine Devoid CMA (December 30, 2010 1:42 PM) CC: discuss sleep apnea    History of Present Illness: 50 yo woman here today for  1) HTN- on tenoretic and lasix.  BP well controlled today.  no CP, SOB, HAs, visual changes, edema.  2) Hyperlipidemia- took Lipitor for 2 months and then returned to Simvastatin 40mg  due to cost.  no myalgias, N/V, abd pain.  3) sleep apnea- husband reports heavy breathing, loud snoring, periods of apnea.  doesn't wake herself.  some daytime sleepiness but not excessive.  sxs have been going on for 'quite awhile'.  sxs are now keeping husband on couch.  Current Medications (verified): 1)  Tenoretic 50 50-25 Mg  Tabs (Atenolol-Chlorthalidone) .... 1/2 Tab Once Daily 2)  Lasix 40 Mg  Tabs (Furosemide) .... Take 1 Tablet By Mouth Once A Day in The Morning 3)  Lipitor 40 Mg Tabs (Atorvastatin Calcium) .... Take One Tablet At Bedtime 4)  Celexa 20 Mg  Tabs (Citalopram Hydrobromide) .... Take 1 Tab in The Morning 5)  Klonopin 1 Mg Tabs (Clonazepam) .Marland Kitchen.. 1 Tab At Bedtime 6)  Vitamin D (Ergocalciferol) 50000 Unit Caps (Ergocalciferol) .... Take One Tablet Weekly X8 Weeks  Allergies (verified): No Known Drug Allergies  Past History:  Past medical, surgical, family and social histories (including risk factors) reviewed for relevance to current acute and chronic problems.  Past Medical History: Reviewed history from 08/13/2009 and no changes required. total abdominal hysterectomy in 2005-not for cancer. metabolic syndrome with overweight, hypertension, and hyperlipidemia. childbirth x 2 BTL Hyperlipidemia  Past Surgical History: Reviewed history from 08/13/2009 and no changes required. TAH no cancer BTL  Family  History: Reviewed history from 12/02/2007 and no changes required. Family History of Alcoholism/Addiction Family History of Asthma Family History of CAD Female 1st degree relative <60 Family History High cholesterol Family History Hypertension  Social History: Reviewed history from 08/13/2009 and no changes required. 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  NAD.  overwt Nose:  External nasal examination shows no deformity or inflammation. Nasal mucosa are pink and moist without lesions or exudates. Mouth:  Oral mucosa and oropharynx without lesions or exudates.  Teeth in good repair.  appears to be redundant tissue in posterior pharynx Neck:  No deformities, masses, or tenderness noted.  thick Lungs:  normal respiratory effort, no intercostal retractions, no accessory muscle use, normal breath sounds, no crackles, and no wheezes.   Heart:  normal rate and no murmur.   Pulses:  +2 carotid, radial, DP Extremities:  No clubbing, cyanosis, edema, or deformity noted with normal full range of motion of all joints.     Impression & Recommendations:  Problem # 1:  HYPERTENSION (ICD-401.9) Assessment Unchanged BP well controlled.  asymptomatic.  have pt return for labs. Her updated medication list for this problem includes:    Tenoretic 50 50-25 Mg Tabs (Atenolol-chlorthalidone) .Marland Kitchen... 1/2 tab once daily    Lasix  40 Mg Tabs (Furosemide) .Marland Kitchen... Take 1 tablet by mouth once a day in the morning  Problem # 2:  HYPERLIPIDEMIA (ICD-272.4) Assessment: Unchanged due for fasting labs.  pt no longer on lipitor- switched back to simvastatin due to cost. Her updated medication list for this problem includes:    Simvastatin 40 Mg Tabs (Simvastatin) .Marland Kitchen... Take one tablet at  bedtime  Problem # 3:  SLEEP APNEA (ICD-780.57) Assessment: New  pt's sxs and habitus suspicious for sleep apnea.  refer to pulm for evaluation  Orders: Pulmonary Referral (Pulmonary)  Complete Medication List: 1)  Tenoretic 50 50-25 Mg Tabs (Atenolol-chlorthalidone) .... 1/2 tab once daily 2)  Lasix 40 Mg Tabs (Furosemide) .... Take 1 tablet by mouth once a day in the morning 3)  Simvastatin 40 Mg Tabs (Simvastatin) .... Take one tablet at bedtime 4)  Celexa 20 Mg Tabs (Citalopram hydrobromide) .... Take 1 tab in the morning 5)  Klonopin 1 Mg Tabs (Clonazepam) .Marland Kitchen.. 1 tab at bedtime 6)  Vitamin D (ergocalciferol) 50000 Unit Caps (Ergocalciferol) .... Take one tablet weekly x8 weeks  Patient Instructions: 1)  Schedule a fasting lab visit at your convenience 2)  BMP prior to visit, ICD-9: 401.9 3)  Hepatic Panel prior to visit ICD-9: 272.4 4)  Lipid panel prior to visit ICD-9 : 272.4 5)  Vit D prior to visit ICD-9: 268.9 6)  Someone will call you with your pulmonary appt 7)  Keep up the good work on healthy food choices and regular exercise 8)  Call with any questions or concerns 9)  Hang in there!! Prescriptions: SIMVASTATIN 40 MG TABS (SIMVASTATIN) Take one tablet at bedtime  #90 x 3   Entered and Authorized by:   [name] MD   Signed by:   [name] MD on 12/30/2010   Method used:   Historical   RxID:   [id number]    Orders Added: 1)  Pulmonary Referral [Pulmonary] 2)  Est. Patient Level IV [27062]

## 2011-02-04 NOTE — Letter (Signed)
Summary: [facility]   Imported By: Lanelle Bal 01/26/2011 10:57:46  _____________________________________________________________________  External Attachment:    Type:   Image     Comment:   External Document

## 2011-02-04 NOTE — Assessment & Plan Note (Signed)
Summary: consult for possible osa   Copy to:  [name] Primary Provider/Referring Provider:  [name] MD  CC:  Sleep Consult.  History of Present Illness: the pt is a 50y/lo female who I have been asked to see for possible osa.  She has been noted to have loud snoring, and on some occasions an abnormal breathing pattern during sleep.  She denies any choking arousals.  She goes to bed around 11pm, and arises at 6am to start her day.  She feels rested most am's, but does awaken 1-2 times a night for unknown reasons.  She feels her alertness is adequated during the day, even when doing tedious paperwork.  She does not fall asleep during day or nap.  She can doze in the evenings with tv and movies.  She admits to some sleep pressure with driving longer distances.  Her epworth score today is only 4, and she tells me her weight is neutral over the last few years.  Current Medications (verified): 1)  Tenoretic 50 50-25 Mg  Tabs (Atenolol-Chlorthalidone) .... 1/2 Tab Once Daily 2)  Lasix 40 Mg  Tabs (Furosemide) .... Take 1 Tablet By Mouth Once A Day in The Morning As Needed 3)  Simvastatin 40 Mg Tabs (Simvastatin) .... Take One Tablet At Bedtime 4)  Celexa 20 Mg  Tabs (Citalopram Hydrobromide) .... Take 1 Tab in The Morning As Needed 5)  Klonopin 1 Mg Tabs (Clonazepam) .Marland Kitchen.. 1 Tab At Bedtime As Needed 6)  Vitamin D (Ergocalciferol) 50000 Unit Caps (Ergocalciferol) .... Take One Tablet Weekly X8 Weeks  Allergies (verified): No Known Drug Allergies  Past History:  Past Medical History: metabolic syndrome with overweight, hypertension, and hyperlipidemia. childbirth x 2 ADENOCARCINOMA, BREAST (ICD-174.9) VITAMIN D DEFICIENCY (ICD-268.9) PHYSICAL EXAMINATION (ICD-V70.0) DEPRESSIVE DISORDER (ICD-311) HYPERLIPIDEMIA (ICD-272.4) HYPERTENSION (ICD-401.9) OBESITY (ICD-278.00)     Past Surgical History: total abdominal hysterectomy in 2005-not for  cancer. BTL tonsillectomy  Family History: Reviewed history from 12/02/2007 and no changes required. Family History of Alcoholism/Addiction Family History of Asthma Family History of CAD Female 1st degree relative <60 Family History High cholesterol Family History Hypertension  Social History: Reviewed history from 08/13/2009 and no changes required. 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 former smoker.  started at age 4.  less than 1 ppd.  quit 1991. Alcohol use-1 glass of wine once a month.  Drug use-no Regular exercise-no  Review of Systems  The patient denies shortness of breath with activity, shortness of breath at rest, productive cough, non-productive cough, coughing up blood, chest pain, irregular heartbeats, acid heartburn, indigestion, loss of appetite, weight change, abdominal pain, difficulty swallowing, sore throat, tooth/dental problems, headaches, nasal congestion/difficulty breathing through nose, sneezing, itching, ear ache, anxiety, depression, hand/feet swelling, joint stiffness or pain, rash, change in color of mucus, and fever.    Vital Signs:  Patient profile:   50 year old female Height:      62 inches Weight:      244 pounds BMI:     44.79 O2 Sat:      99 % on Room air Temp:     97.9 degrees F oral Pulse rate:   65 / minute BP sitting:   120 / 62  (left arm) Cuff size:   large  Vitals Entered By: Arman Filter LPN (January 14, 2011 11:32 AM)  O2 Flow:  Room air CC: Sleep Consult Comments Medications reviewed  with patient Arman Filter LPN  January 14, 2011 11:42 AM    Physical Exam  General:  ow female in nad Eyes:  PERRLA and EOMI.   Nose:  mild mucosal edema, turbinate hypertrophy Mouth:  mild elongation of soft palate, normal uvula Neck:  no jvd, tmg,LN Lungs:  clear to auscultation Heart:  rrr, no mrg Abdomen:  soft and  nontender, bs+ Extremities:  minimal edema, no cyanosis  pulses intact distally Neurologic:  alert, does not appear sleepy, moves all 4.   Impression & Recommendations:  Problem # 1:  SNORING (ICD-786.09) the pt has been noted to have loud snoring, but unclear whether she has frank osa.  She has fairly restorative sleep, and denies significant EDS.  However, she is very busy in her job, and does not have a lot of time to get sleepy.  She does have some abnormal sleepiness in the evenings with movies/tv.  At this point, I do think she would benefit from having a sleep study, especially with her upcoming surgery.  Because her history is not clear cut, and she has no underlying cardiopulmonary disease, I think she would be a good candidate for in-home sleep testing.  The pt is agreeable.  I have had a discussion with her about osa, including its impact on her QOL and CV health.  I have encouraged her to work aggressively on weight loss, even if her sleep study is unremarkable.  Medications Added to Medication List This Visit: 1)  Lasix 40 Mg Tabs (Furosemide) .... Take 1 tablet by mouth once a day in the morning as needed 2)  Celexa 20 Mg Tabs (Citalopram hydrobromide) .... Take 1 tab in the morning as needed 3)  Klonopin 1 Mg Tabs (Clonazepam) .Marland Kitchen.. 1 tab at bedtime as needed  Other Orders: Consultation Level IV (62831) Misc. Referral (Misc. Ref)  Patient Instructions: 1)  will schedule for home sleep study.  Will call when results availble. 2)  work on weight loss

## 2011-02-05 DIAGNOSIS — G4733 Obstructive sleep apnea (adult) (pediatric): Secondary | ICD-10-CM

## 2011-02-06 ENCOUNTER — Encounter: Payer: Self-pay | Admitting: Family Medicine

## 2011-02-09 ENCOUNTER — Ambulatory Visit (INDEPENDENT_AMBULATORY_CARE_PROVIDER_SITE_OTHER): Payer: Self-pay | Admitting: Pulmonary Disease

## 2011-02-09 ENCOUNTER — Encounter: Payer: Self-pay | Admitting: Pulmonary Disease

## 2011-02-09 ENCOUNTER — Telehealth (INDEPENDENT_AMBULATORY_CARE_PROVIDER_SITE_OTHER): Payer: Self-pay | Admitting: *Deleted

## 2011-02-09 DIAGNOSIS — G4733 Obstructive sleep apnea (adult) (pediatric): Secondary | ICD-10-CM | POA: Insufficient documentation

## 2011-02-11 ENCOUNTER — Encounter: Payer: Self-pay | Admitting: Family Medicine

## 2011-02-11 ENCOUNTER — Other Ambulatory Visit: Payer: Self-pay | Admitting: Oncology

## 2011-02-11 ENCOUNTER — Encounter (HOSPITAL_BASED_OUTPATIENT_CLINIC_OR_DEPARTMENT_OTHER): Payer: BC Managed Care – PPO | Admitting: Oncology

## 2011-02-11 DIAGNOSIS — C50119 Malignant neoplasm of central portion of unspecified female breast: Secondary | ICD-10-CM

## 2011-02-11 LAB — CBC WITH DIFFERENTIAL/PLATELET
Basophils Absolute: 0 10*3/uL (ref 0.0–0.1)
Eosinophils Absolute: 0.1 10*3/uL (ref 0.0–0.5)
HCT: 37.6 % (ref 34.8–46.6)
HGB: 13.1 g/dL (ref 11.6–15.9)
LYMPH%: 43.4 % (ref 14.0–49.7)
MONO#: 0.4 10*3/uL (ref 0.1–0.9)
NEUT#: 2.7 10*3/uL (ref 1.5–6.5)
NEUT%: 46.5 % (ref 38.4–76.8)
Platelets: 296 10*3/uL (ref 145–400)
RBC: 4.19 10*6/uL (ref 3.70–5.45)
WBC: 5.9 10*3/uL (ref 3.9–10.3)

## 2011-02-11 LAB — COMPREHENSIVE METABOLIC PANEL
Albumin: 4.3 g/dL (ref 3.5–5.2)
BUN: 16 mg/dL (ref 6–23)
CO2: 28 mEq/L (ref 19–32)
Glucose, Bld: 110 mg/dL — ABNORMAL HIGH (ref 70–99)
Sodium: 137 mEq/L (ref 135–145)
Total Bilirubin: 0.4 mg/dL (ref 0.3–1.2)
Total Protein: 7.2 g/dL (ref 6.0–8.3)

## 2011-02-13 ENCOUNTER — Encounter: Payer: Self-pay | Admitting: Family Medicine

## 2011-02-13 ENCOUNTER — Ambulatory Visit: Payer: BC Managed Care – PPO | Attending: Radiation Oncology | Admitting: Radiation Oncology

## 2011-02-13 DIAGNOSIS — Z170 Estrogen receptor positive status [ER+]: Secondary | ICD-10-CM | POA: Insufficient documentation

## 2011-02-13 DIAGNOSIS — E7800 Pure hypercholesterolemia, unspecified: Secondary | ICD-10-CM | POA: Insufficient documentation

## 2011-02-13 DIAGNOSIS — Z79899 Other long term (current) drug therapy: Secondary | ICD-10-CM | POA: Insufficient documentation

## 2011-02-13 DIAGNOSIS — Z8043 Family history of malignant neoplasm of testis: Secondary | ICD-10-CM | POA: Insufficient documentation

## 2011-02-13 DIAGNOSIS — C50119 Malignant neoplasm of central portion of unspecified female breast: Secondary | ICD-10-CM | POA: Insufficient documentation

## 2011-02-13 DIAGNOSIS — C773 Secondary and unspecified malignant neoplasm of axilla and upper limb lymph nodes: Secondary | ICD-10-CM | POA: Insufficient documentation

## 2011-02-18 NOTE — Progress Notes (Signed)
Summary: has questions about her Rx's and leaving them at the pharm  Phone Note Call from Patient Call back at cell - [phone]   Caller: Patient Summary of Call: patient called because she is "having trouble getting her prescriptions filled"----she says she takes the paper prescription to her pharmacy, but doesnt have it filled immediatley--when she finally calls, they dont have any record of the prescriptions---she says she had her physical in July-August and now the pharmacy has no record of these prescriptions ---I suggested that the time frame from Auguest to February was over 6 months and that her pharmacy may be deactivating it since it had been one-half of the year---she says this "happens all the time"---I asked if she had a large amount of the medication when she dropped of the paper prescriptions, but she just said --"they do it all the time"  --[facility] [address] Initial call taken by: Jerolyn Shin,  February 09, 2011 12:27 PM  Follow-up for Phone Call        spoke w/ patient aware prescription sent to pharmacy.......Marland KitchenDoristine Devoid CMA  February 09, 2011 4:51 PM     Prescriptions: KLONOPIN 1 MG TABS (CLONAZEPAM) 1 tab at bedtime as needed  #30 x 2   Entered by:   Doristine Devoid CMA   Authorized by:   [name] MD   Signed by:   Doristine Devoid CMA on 02/09/2011   Method used:   Printed then faxed to ...       [facility]  [address] [phone]* (retail)       [address]       [place], [place]  [id number]       Ph: [phone] or [phone]       Fax: [phone]   RxID:   [id number] CELEXA 20 MG  TABS (CITALOPRAM HYDROBROMIDE) Take 1 tab in the morning as needed  #90 x 3   Entered by:   Doristine Devoid CMA   Authorized by:   [name] MD   Signed by:   Doristine Devoid CMA on 02/09/2011   Method used:   Printed then faxed to ...       [facility]  [address] [phone]* (retail)       [address]       [place], [place]  [id number]       Ph: [phone] or [phone]       Fax: [phone]   RxID:   [id number] TENORETIC 50 50-25 MG  TABS (ATENOLOL-CHLORTHALIDONE) 1/2 tab once daily  #45 x 3   Entered by:   Doristine Devoid CMA   Authorized by:   [name] MD   Signed by:   Doristine Devoid CMA on 02/09/2011   Method used:   Printed then faxed to ...       [facility]  [address] [phone]* (retail)       [address]       [place], [place]  [address]       Ph: [phone] or [phone]       Fax: [phone]   RxID:   [id number]

## 2011-02-24 NOTE — Assessment & Plan Note (Signed)
Summary: rov to review npsg results   Copy to:  [name] Primary Provider/Referring Provider:  [name] MD  CC:  OV to discuss sleep study results. .  History of Present Illness: the pt comes in today for f/u of her recent sleep study.  She was found to have mild osa, with AHI 9/hr, with desats to 82%.  I have reviewed the study in detail with her, and answered all of her questions.  Current Medications (verified): 1)  Tenoretic 50 50-25 Mg  Tabs (Atenolol-Chlorthalidone) .... 1/2 Tab Once Daily 2)  Lasix 40 Mg  Tabs (Furosemide) .... Take 1 Tablet By Mouth Once A Day in The Morning As Needed 3)  Simvastatin 40 Mg Tabs (Simvastatin) .... Take One Tablet At Bedtime 4)  Celexa 20 Mg  Tabs (Citalopram Hydrobromide) .... Take 1 Tab in The Morning As Needed 5)  Klonopin 1 Mg Tabs (Clonazepam) .Marland Kitchen.. 1 Tab At Bedtime As Needed  Allergies (verified): No Known Drug Allergies  Past History:  Past medical, surgical, family and social histories (including risk factors) reviewed, and no changes noted (except as noted below).  Past Medical History: Reviewed history from 01/14/2011 and no changes required. metabolic syndrome with overweight, hypertension, and hyperlipidemia. childbirth x 2 ADENOCARCINOMA, BREAST (ICD-174.9) VITAMIN D DEFICIENCY (ICD-268.9) PHYSICAL EXAMINATION (ICD-V70.0) DEPRESSIVE DISORDER (ICD-311) HYPERLIPIDEMIA (ICD-272.4) HYPERTENSION (ICD-401.9) OBESITY (ICD-278.00)     Past Surgical History: Reviewed history from 01/14/2011 and no changes required. total abdominal hysterectomy in 2005-not for cancer. BTL tonsillectomy  Family History: Reviewed history from 12/02/2007 and no changes required. Family History of Alcoholism/Addiction Family History of Asthma Family History of CAD Female 1st degree relative <60 Family History High cholesterol Family History Hypertension  Social History: Reviewed history from 01/14/2011 and no changes  required. 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 former smoker.  started at age 32.  less than 1 ppd.  quit 1991. Alcohol use-1 glass of wine once a month.  Drug use-no Regular exercise-no  Review of Systems       The patient complains of anxiety.  The patient denies shortness of breath with activity, shortness of breath at rest, productive cough, non-productive cough, coughing up blood, chest pain, irregular heartbeats, acid heartburn, indigestion, loss of appetite, weight change, abdominal pain, difficulty swallowing, sore throat, tooth/dental problems, headaches, nasal congestion/difficulty breathing through nose, sneezing, itching, ear ache, depression, hand/feet swelling, joint stiffness or pain, rash, change in color of mucus, and fever.    Vital Signs:  Patient profile:   50 year old female Height:      62 inches Weight:      241.25 pounds BMI:     44.28 O2 Sat:      97 % on Room air Temp:     98.1 degrees F oral Pulse rate:   61 / minute BP sitting:   144 / 78  (right arm) Cuff size:   large  Vitals Entered By: Arman Filter LPN (February 09, 2011 9:41 AM)  O2 Flow:  Room air CC: OV to discuss sleep study results.  Comments Medications reviewed with patient Arman Filter LPN  February 09, 2011 9:41 AM    Physical Exam  General:  ow female in nad Extremities:  minimal edema, no cyanosis  Neurologic:  alert, does not appear sleepy, moves all 4.    Impression & Recommendations:  Problem # 1:  OBSTRUCTIVE SLEEP APNEA (ICD-327.23)  the pt has mild osa by her recent sleep study, and at least this is not a health/CV risk for her.  I have outlined conservative treatments (weight loss and positional therapy), as well as more aggressive treatments (dental appliance and cpap....do not think a good surgery candidate).  At this point, she does not feel that it is affecting  her overall QOL, but her snoring and apnea are really bothering her husband.  She will take some time to decide about treatment, and in the interim will work aggressively on weight loss.  Other Orders: Est. Patient Level III (91478)  Patient Instructions: 1)  work on weight loss over the next 6mos, but call if you wish to consider a dental appliance or cpap.

## 2011-02-24 NOTE — Letter (Signed)
Summary: [facility]  [facility]   Imported By: Maryln Gottron 02/19/2011 09:32:45  _____________________________________________________________________  External Attachment:    Type:   Image     Comment:   External Document

## 2011-03-03 ENCOUNTER — Other Ambulatory Visit: Payer: Self-pay | Admitting: Oncology

## 2011-03-03 ENCOUNTER — Encounter (HOSPITAL_BASED_OUTPATIENT_CLINIC_OR_DEPARTMENT_OTHER): Payer: BC Managed Care – PPO | Admitting: Oncology

## 2011-03-03 DIAGNOSIS — C50119 Malignant neoplasm of central portion of unspecified female breast: Secondary | ICD-10-CM

## 2011-03-03 LAB — COMPREHENSIVE METABOLIC PANEL
Albumin: 4.5 g/dL (ref 3.5–5.2)
CO2: 27 mEq/L (ref 19–32)
Glucose, Bld: 93 mg/dL (ref 70–99)
Potassium: 4.1 mEq/L (ref 3.5–5.3)
Sodium: 137 mEq/L (ref 135–145)
Total Protein: 7.5 g/dL (ref 6.0–8.3)

## 2011-03-03 LAB — CBC WITH DIFFERENTIAL/PLATELET
Eosinophils Absolute: 0.1 10*3/uL (ref 0.0–0.5)
MONO#: 0.5 10*3/uL (ref 0.1–0.9)
NEUT#: 2.8 10*3/uL (ref 1.5–6.5)
Platelets: 284 10*3/uL (ref 145–400)
RBC: 4.01 10*6/uL (ref 3.70–5.45)
RDW: 12.2 % (ref 11.2–14.5)
WBC: 5.7 10*3/uL (ref 3.9–10.3)

## 2011-03-05 NOTE — Letter (Signed)
Summary: [facility]  [facility]   Imported By: Maryln Gottron 02/26/2011 10:23:35  _____________________________________________________________________  External Attachment:    Type:   Image     Comment:   External Document

## 2011-03-06 ENCOUNTER — Other Ambulatory Visit (HOSPITAL_COMMUNITY): Payer: BC Managed Care – PPO

## 2011-03-06 ENCOUNTER — Encounter (HOSPITAL_BASED_OUTPATIENT_CLINIC_OR_DEPARTMENT_OTHER)
Admission: RE | Admit: 2011-03-06 | Discharge: 2011-03-06 | Disposition: A | Discharge status: Home / Self Care | Payer: BC Managed Care – PPO | Source: Ambulatory Visit | Attending: General Surgery | Admitting: General Surgery

## 2011-03-10 ENCOUNTER — Ambulatory Visit (HOSPITAL_COMMUNITY)
Admission: RE | Admit: 2011-03-10 | Discharge: 2011-03-10 | Disposition: A | Discharge status: Home / Self Care | Payer: BC Managed Care – PPO | Source: Ambulatory Visit | Attending: Oncology | Admitting: Oncology

## 2011-03-10 DIAGNOSIS — Z853 Personal history of malignant neoplasm of breast: Secondary | ICD-10-CM | POA: Insufficient documentation

## 2011-03-10 DIAGNOSIS — Z01818 Encounter for other preprocedural examination: Secondary | ICD-10-CM | POA: Insufficient documentation

## 2011-03-10 DIAGNOSIS — Z09 Encounter for follow-up examination after completed treatment for conditions other than malignant neoplasm: Secondary | ICD-10-CM | POA: Insufficient documentation

## 2011-03-11 ENCOUNTER — Ambulatory Visit (HOSPITAL_BASED_OUTPATIENT_CLINIC_OR_DEPARTMENT_OTHER)
Admission: RE | Admit: 2011-03-11 | Discharge: 2011-03-11 | Disposition: A | Discharge status: Home / Self Care | Payer: BC Managed Care – PPO | Source: Ambulatory Visit | Attending: General Surgery | Admitting: General Surgery

## 2011-03-11 ENCOUNTER — Ambulatory Visit (HOSPITAL_COMMUNITY): Payer: BC Managed Care – PPO

## 2011-03-11 ENCOUNTER — Ambulatory Visit (HOSPITAL_COMMUNITY): Admission: RE | Admit: 2011-03-11 | Payer: BC Managed Care – PPO | Source: Ambulatory Visit | Admitting: General Surgery

## 2011-03-11 DIAGNOSIS — Z01812 Encounter for preprocedural laboratory examination: Secondary | ICD-10-CM | POA: Insufficient documentation

## 2011-03-11 DIAGNOSIS — I059 Rheumatic mitral valve disease, unspecified: Secondary | ICD-10-CM | POA: Insufficient documentation

## 2011-03-11 DIAGNOSIS — Z01818 Encounter for other preprocedural examination: Secondary | ICD-10-CM | POA: Insufficient documentation

## 2011-03-11 DIAGNOSIS — C50919 Malignant neoplasm of unspecified site of unspecified female breast: Secondary | ICD-10-CM | POA: Insufficient documentation

## 2011-03-11 LAB — POCT I-STAT, CHEM 8
HCT: 39 % (ref 36.0–46.0)
Hemoglobin: 13.3 g/dL (ref 12.0–15.0)
Potassium: 3.6 mEq/L (ref 3.5–5.1)
Sodium: 139 mEq/L (ref 135–145)

## 2011-03-16 NOTE — Op Note (Signed)
  NAMEJESSA, [name]                 ACCOUNT NO.:  [id number]  MEDICAL RECORD NO.:  [id number]           PATIENT TYPE:  O  LOCATION:  SDS                          FACILITY:  [facility]  PHYSICIAN:  Sharlet Salina T. Tiwan Schnitker, M.D.DATE OF BIRTH:  07-16-1961  DATE OF PROCEDURE:  03/11/2011 DATE OF DISCHARGE:                              OPERATIVE REPORT   PREOPERATIVE DIAGNOSIS:  Cancer of the breast and poor venous access.  POSTOPERATIVE DIAGNOSES:  Cancer of the breast and poor venous access.  SURGICAL PROCEDURES:  Placement of subcutaneous venous port via right internal jugular vein.  SURGEON:  Lorne Skeens. Syre Knerr, MD  ANESTHESIA:  Local with IV sedation.  BRIEF HISTORY:  [name] is a 50 year old female with a recent diagnosis of breast cancer who will require long-term venous access for chemotherapy.  Placement of subcutaneous venous port has been recommended and accepted.  We discussed the indications for the procedure, its nature, risks of anesthetic complications, bleeding, infection, catheter displacement or occlusion, and pneumothorax.  She is now brought to the operating room for this procedure.  DESCRIPTION OF OPERATION:  The patient was brought to operating room and placed in supine position on the operating table and IV sedation was administered.  She was positioned with a roll between the shoulder blades and arms to her side and the entire neck and upper chest widely sterilely prepped and draped.  She received preoperative IV antibiotics. Correct patient and procedure were verified.  Initially, the right infraclavicular area was anesthetized, but I was unable to cannulate the right subclavian vein.  I then anesthetized the right anterior neck and the right internal jugular vein was cannulated with a needle and guidewire without difficulty, confirmed by fluoroscopy.  Following this, the introducer was placed over the guidewire and the flushed catheter placed via the  introducer which was slipped away and the tip of the catheter positioned at the cavoatrial junction.  The site for the port on the right anterior chest wall was anesthetized and a small transverse incision made and subcutaneous pocket created.  The catheter was then tunneled subcutaneously to the pocket, trimmed to length, and attached to the port which was sutured to the anterior chest wall with interrupted Prolene.  This incision was closed with subcutaneous interrupted 4-0 Monocryl and skin was closed in both locations with subcuticular 4-0 Monocryl and Dermabond.  The catheter flushed and aspirated easily and was left flushed with concentrated heparin solution.  Sponge and needle counts were correct.  The patient was taken to the recovery in good condition.     Lorne Skeens. Nyquan Selbe, M.D.     Tory Emerald  D:  03/11/2011  T:  03/12/2011  Job:  [id number]  Electronically Signed by [name] M.D. on 03/16/2011 10:25:13 AM

## 2011-03-17 ENCOUNTER — Other Ambulatory Visit: Payer: Self-pay | Admitting: Oncology

## 2011-03-17 ENCOUNTER — Encounter (HOSPITAL_BASED_OUTPATIENT_CLINIC_OR_DEPARTMENT_OTHER): Payer: BC Managed Care – PPO | Admitting: Oncology

## 2011-03-17 ENCOUNTER — Ambulatory Visit (HOSPITAL_COMMUNITY)
Admission: RE | Admit: 2011-03-17 | Discharge: 2011-03-17 | Disposition: A | Discharge status: Home / Self Care | Payer: BC Managed Care – PPO | Source: Ambulatory Visit | Attending: Oncology | Admitting: Oncology

## 2011-03-17 DIAGNOSIS — Z95828 Presence of other vascular implants and grafts: Secondary | ICD-10-CM

## 2011-03-17 DIAGNOSIS — C50119 Malignant neoplasm of central portion of unspecified female breast: Secondary | ICD-10-CM

## 2011-03-17 DIAGNOSIS — C50919 Malignant neoplasm of unspecified site of unspecified female breast: Secondary | ICD-10-CM

## 2011-03-17 DIAGNOSIS — Z5111 Encounter for antineoplastic chemotherapy: Secondary | ICD-10-CM

## 2011-03-17 LAB — CBC WITH DIFFERENTIAL/PLATELET
Basophils Absolute: 0.1 10*3/uL (ref 0.0–0.1)
EOS%: 1.7 % (ref 0.0–7.0)
Eosinophils Absolute: 0.1 10*3/uL (ref 0.0–0.5)
HGB: 12.6 g/dL (ref 11.6–15.9)
MCH: 31.9 pg (ref 25.1–34.0)
NEUT#: 4.1 10*3/uL (ref 1.5–6.5)
RDW: 12.2 % (ref 11.2–14.5)
lymph#: 2.6 10*3/uL (ref 0.9–3.3)

## 2011-03-17 LAB — COMPREHENSIVE METABOLIC PANEL
ALT: 14 U/L (ref 0–35)
AST: 14 U/L (ref 0–37)
Albumin: 4.3 g/dL (ref 3.5–5.2)
BUN: 8 mg/dL (ref 6–23)
Calcium: 9.6 mg/dL (ref 8.4–10.5)
Chloride: 99 mEq/L (ref 96–112)
Potassium: 4.3 mEq/L (ref 3.5–5.3)
Sodium: 136 mEq/L (ref 135–145)
Total Protein: 7.4 g/dL (ref 6.0–8.3)

## 2011-03-17 NOTE — Letter (Signed)
Summary: Dr [name] Note  Dr [name] Note   Imported By: Kassie Mends 03/12/2011 09:34:43  _____________________________________________________________________  External Attachment:    Type:   Image     Comment:   External Document

## 2011-03-18 ENCOUNTER — Encounter (HOSPITAL_BASED_OUTPATIENT_CLINIC_OR_DEPARTMENT_OTHER): Payer: BC Managed Care – PPO | Admitting: Oncology

## 2011-03-18 DIAGNOSIS — C50119 Malignant neoplasm of central portion of unspecified female breast: Secondary | ICD-10-CM

## 2011-03-18 DIAGNOSIS — Z5189 Encounter for other specified aftercare: Secondary | ICD-10-CM

## 2011-03-22 ENCOUNTER — Emergency Department (HOSPITAL_COMMUNITY)
Admission: EM | Admit: 2011-03-22 | Discharge: 2011-03-22 | Disposition: A | Discharge status: Home / Self Care | Payer: BC Managed Care – PPO | Attending: Emergency Medicine | Admitting: Emergency Medicine

## 2011-03-22 DIAGNOSIS — Z79899 Other long term (current) drug therapy: Secondary | ICD-10-CM | POA: Insufficient documentation

## 2011-03-22 DIAGNOSIS — R509 Fever, unspecified: Secondary | ICD-10-CM | POA: Insufficient documentation

## 2011-03-22 DIAGNOSIS — C50919 Malignant neoplasm of unspecified site of unspecified female breast: Secondary | ICD-10-CM | POA: Insufficient documentation

## 2011-03-22 LAB — URINALYSIS, ROUTINE W REFLEX MICROSCOPIC
Bilirubin Urine: NEGATIVE
Ketones, ur: NEGATIVE mg/dL
Nitrite: NEGATIVE
Protein, ur: NEGATIVE mg/dL
Specific Gravity, Urine: 1.018 (ref 1.005–1.030)
Urobilinogen, UA: 1 mg/dL (ref 0.0–1.0)

## 2011-03-22 LAB — COMPREHENSIVE METABOLIC PANEL
AST: 16 U/L (ref 0–37)
Albumin: 3.6 g/dL (ref 3.5–5.2)
BUN: 13 mg/dL (ref 6–23)
Calcium: 8.8 mg/dL (ref 8.4–10.5)
Creatinine, Ser: 0.71 mg/dL (ref 0.4–1.2)
GFR calc Af Amer: >60 mL/min (ref >60)
GFR calc non Af Amer: >60 mL/min (ref >60)
Total Bilirubin: 1.1 mg/dL (ref 0.3–1.2)

## 2011-03-22 LAB — CBC
MCH: 31.2 pg (ref 26.0–34.0)
MCHC: 33.6 g/dL (ref 30.0–36.0)
MCV: 92.8 fL (ref 78.0–100.0)
Platelets: 171 10*3/uL (ref 150–400)

## 2011-03-22 LAB — DIFFERENTIAL
Basophils Relative: 1 % (ref 0–1)
Eosinophils Absolute: 0 10*3/uL (ref 0.0–0.7)
Eosinophils Relative: 0 % (ref 0–5)
Lymphs Abs: 0.8 10*3/uL (ref 0.7–4.0)
Monocytes Absolute: 0 10*3/uL — ABNORMAL LOW (ref 0.1–1.0)
Neutro Abs: 3.4 10*3/uL (ref 1.7–7.7)

## 2011-03-24 ENCOUNTER — Encounter (HOSPITAL_BASED_OUTPATIENT_CLINIC_OR_DEPARTMENT_OTHER): Payer: BC Managed Care – PPO | Admitting: Oncology

## 2011-03-24 ENCOUNTER — Other Ambulatory Visit: Payer: Self-pay | Admitting: Oncology

## 2011-03-24 DIAGNOSIS — C50119 Malignant neoplasm of central portion of unspecified female breast: Secondary | ICD-10-CM

## 2011-03-24 LAB — CBC WITH DIFFERENTIAL/PLATELET
Basophils Absolute: 0 10*3/uL (ref 0.0–0.1)
EOS%: 2.8 % (ref 0.0–7.0)
Eosinophils Absolute: 0.1 10*3/uL (ref 0.0–0.5)
HCT: 37.7 % (ref 34.8–46.6)
HGB: 12.9 g/dL (ref 11.6–15.9)
MCH: 30.9 pg (ref 25.1–34.0)
MCV: 90.2 fL (ref 79.5–101.0)
NEUT#: 2.1 10*3/uL (ref 1.5–6.5)
NEUT%: 58.4 % (ref 38.4–76.8)
RDW: 11.8 % (ref 11.2–14.5)
lymph#: 1.2 10*3/uL (ref 0.9–3.3)

## 2011-03-24 LAB — BASIC METABOLIC PANEL
BUN: 14 mg/dL (ref 6–23)
Chloride: 97 mEq/L (ref 96–112)
Creatinine, Ser: 0.76 mg/dL (ref 0.40–1.20)
Glucose, Bld: 96 mg/dL (ref 70–99)
Potassium: 3.7 mEq/L (ref 3.5–5.3)

## 2011-03-31 ENCOUNTER — Other Ambulatory Visit: Payer: Self-pay | Admitting: Oncology

## 2011-03-31 ENCOUNTER — Encounter (HOSPITAL_BASED_OUTPATIENT_CLINIC_OR_DEPARTMENT_OTHER): Payer: BC Managed Care – PPO | Admitting: Oncology

## 2011-03-31 DIAGNOSIS — Z5111 Encounter for antineoplastic chemotherapy: Secondary | ICD-10-CM

## 2011-03-31 DIAGNOSIS — C50119 Malignant neoplasm of central portion of unspecified female breast: Secondary | ICD-10-CM

## 2011-03-31 LAB — CBC WITH DIFFERENTIAL/PLATELET
BASO%: 0.4 % (ref 0.0–2.0)
EOS%: 0.2 % (ref 0.0–7.0)
Eosinophils Absolute: 0 10*3/uL (ref 0.0–0.5)
LYMPH%: 20.1 % (ref 14.0–49.7)
MCH: 30.6 pg (ref 25.1–34.0)
MCHC: 33.2 g/dL (ref 31.5–36.0)
MCV: 92.1 fL (ref 79.5–101.0)
MONO%: 5.8 % (ref 0.0–14.0)
NEUT#: 9.4 10*3/uL — ABNORMAL HIGH (ref 1.5–6.5)
Platelets: 212 10*3/uL (ref 145–400)
RBC: 3.92 10*6/uL (ref 3.70–5.45)
RDW: 12.1 % (ref 11.2–14.5)
nRBC: 0 % (ref 0–0)

## 2011-03-31 LAB — COMPREHENSIVE METABOLIC PANEL
ALT: 24 U/L (ref 0–35)
Alkaline Phosphatase: 86 U/L (ref 39–117)
CO2: 31 mEq/L (ref 19–32)
Creatinine, Ser: 1 mg/dL (ref 0.40–1.20)
Sodium: 138 mEq/L (ref 135–145)
Total Bilirubin: 0.2 mg/dL — ABNORMAL LOW (ref 0.3–1.2)
Total Protein: 7 g/dL (ref 6.0–8.3)

## 2011-04-01 ENCOUNTER — Encounter (HOSPITAL_BASED_OUTPATIENT_CLINIC_OR_DEPARTMENT_OTHER): Payer: BC Managed Care – PPO | Admitting: Oncology

## 2011-04-01 DIAGNOSIS — Z5189 Encounter for other specified aftercare: Secondary | ICD-10-CM

## 2011-04-01 DIAGNOSIS — C50119 Malignant neoplasm of central portion of unspecified female breast: Secondary | ICD-10-CM

## 2011-04-03 ENCOUNTER — Other Ambulatory Visit: Payer: Self-pay | Admitting: Physician Assistant

## 2011-04-03 ENCOUNTER — Other Ambulatory Visit: Payer: Self-pay | Admitting: Oncology

## 2011-04-03 ENCOUNTER — Observation Stay (HOSPITAL_COMMUNITY): Payer: BC Managed Care – PPO

## 2011-04-03 ENCOUNTER — Encounter (HOSPITAL_BASED_OUTPATIENT_CLINIC_OR_DEPARTMENT_OTHER): Payer: BC Managed Care – PPO | Admitting: Oncology

## 2011-04-03 ENCOUNTER — Observation Stay (HOSPITAL_COMMUNITY)
Admission: AD | Admit: 2011-04-03 | Discharge: 2011-04-06 | Disposition: A | Discharge status: Home / Self Care | Payer: BC Managed Care – PPO | Source: Ambulatory Visit | Attending: Oncology | Admitting: Oncology

## 2011-04-03 ENCOUNTER — Ambulatory Visit (HOSPITAL_COMMUNITY)
Admission: RE | Admit: 2011-04-03 | Discharge: 2011-04-03 | Disposition: A | Discharge status: Home / Self Care | Payer: BC Managed Care – PPO | Source: Ambulatory Visit | Attending: Oncology | Admitting: Oncology

## 2011-04-03 DIAGNOSIS — D72829 Elevated white blood cell count, unspecified: Secondary | ICD-10-CM | POA: Insufficient documentation

## 2011-04-03 DIAGNOSIS — R109 Unspecified abdominal pain: Secondary | ICD-10-CM | POA: Insufficient documentation

## 2011-04-03 DIAGNOSIS — R112 Nausea with vomiting, unspecified: Secondary | ICD-10-CM | POA: Insufficient documentation

## 2011-04-03 DIAGNOSIS — E7800 Pure hypercholesterolemia, unspecified: Secondary | ICD-10-CM | POA: Insufficient documentation

## 2011-04-03 DIAGNOSIS — C50919 Malignant neoplasm of unspecified site of unspecified female breast: Secondary | ICD-10-CM | POA: Insufficient documentation

## 2011-04-03 DIAGNOSIS — C50119 Malignant neoplasm of central portion of unspecified female breast: Secondary | ICD-10-CM

## 2011-04-03 DIAGNOSIS — E876 Hypokalemia: Secondary | ICD-10-CM | POA: Insufficient documentation

## 2011-04-03 DIAGNOSIS — K56609 Unspecified intestinal obstruction, unspecified as to partial versus complete obstruction: Principal | ICD-10-CM | POA: Insufficient documentation

## 2011-04-03 DIAGNOSIS — I10 Essential (primary) hypertension: Secondary | ICD-10-CM | POA: Insufficient documentation

## 2011-04-03 DIAGNOSIS — E860 Dehydration: Secondary | ICD-10-CM | POA: Insufficient documentation

## 2011-04-03 DIAGNOSIS — C801 Malignant (primary) neoplasm, unspecified: Secondary | ICD-10-CM | POA: Insufficient documentation

## 2011-04-03 LAB — CBC WITH DIFFERENTIAL/PLATELET
Basophils Absolute: 0.1 10*3/uL (ref 0.0–0.1)
EOS%: 0 % (ref 0.0–7.0)
MCH: 31.7 pg (ref 25.1–34.0)
MCHC: 33.7 g/dL (ref 31.5–36.0)
MCV: 94.2 fL (ref 79.5–101.0)
MONO%: 0.6 % (ref 0.0–14.0)
RBC: 4.05 10*6/uL (ref 3.70–5.45)
RDW: 12.4 % (ref 11.2–14.5)

## 2011-04-03 LAB — BASIC METABOLIC PANEL
BUN: 15 mg/dL (ref 6–23)
Potassium: 3.4 mEq/L — ABNORMAL LOW (ref 3.5–5.3)
Sodium: 139 mEq/L (ref 135–145)

## 2011-04-03 MED ORDER — IOHEXOL 300 MG/ML  SOLN
100.0000 mL | Freq: Once | INTRAMUSCULAR | Status: AC | PRN
  Administered 2011-04-03: 100 mL via INTRAVENOUS

## 2011-04-04 DIAGNOSIS — C50119 Malignant neoplasm of central portion of unspecified female breast: Secondary | ICD-10-CM

## 2011-04-05 ENCOUNTER — Observation Stay (HOSPITAL_COMMUNITY): Payer: BC Managed Care – PPO

## 2011-04-05 DIAGNOSIS — C50919 Malignant neoplasm of unspecified site of unspecified female breast: Secondary | ICD-10-CM

## 2011-04-05 DIAGNOSIS — K56609 Unspecified intestinal obstruction, unspecified as to partial versus complete obstruction: Secondary | ICD-10-CM

## 2011-04-05 DIAGNOSIS — D72829 Elevated white blood cell count, unspecified: Secondary | ICD-10-CM

## 2011-04-05 LAB — BASIC METABOLIC PANEL
Calcium: 8.4 mg/dL (ref 8.4–10.5)
GFR calc Af Amer: >60 mL/min (ref >60)
GFR calc non Af Amer: >60 mL/min (ref >60)
Sodium: 136 mEq/L (ref 135–145)

## 2011-04-05 LAB — CBC
Hemoglobin: 10.4 g/dL — ABNORMAL LOW (ref 12.0–15.0)
RBC: 3.36 MIL/uL — ABNORMAL LOW (ref 3.87–5.11)

## 2011-04-06 DIAGNOSIS — R509 Fever, unspecified: Secondary | ICD-10-CM

## 2011-04-06 DIAGNOSIS — C50919 Malignant neoplasm of unspecified site of unspecified female breast: Secondary | ICD-10-CM

## 2011-04-06 DIAGNOSIS — R0602 Shortness of breath: Secondary | ICD-10-CM

## 2011-04-06 LAB — CBC
HCT: 30.5 % — ABNORMAL LOW (ref 36.0–46.0)
MCV: 92.4 fL (ref 78.0–100.0)
Platelets: 150 10*3/uL (ref 150–400)
RBC: 3.3 MIL/uL — ABNORMAL LOW (ref 3.87–5.11)
WBC: 1.8 10*3/uL — ABNORMAL LOW (ref 4.0–10.5)

## 2011-04-06 LAB — BASIC METABOLIC PANEL
BUN: 6 mg/dL (ref 6–23)
Chloride: 104 mEq/L (ref 96–112)
Glucose, Bld: 92 mg/dL (ref 70–99)
Potassium: 3.8 mEq/L (ref 3.5–5.1)

## 2011-04-07 ENCOUNTER — Other Ambulatory Visit: Payer: Self-pay | Admitting: Oncology

## 2011-04-07 ENCOUNTER — Encounter (HOSPITAL_BASED_OUTPATIENT_CLINIC_OR_DEPARTMENT_OTHER): Payer: BC Managed Care – PPO | Admitting: Oncology

## 2011-04-07 DIAGNOSIS — Z5111 Encounter for antineoplastic chemotherapy: Secondary | ICD-10-CM

## 2011-04-07 DIAGNOSIS — C50119 Malignant neoplasm of central portion of unspecified female breast: Secondary | ICD-10-CM

## 2011-04-07 LAB — CBC WITH DIFFERENTIAL/PLATELET
BASO%: 0.2 % (ref 0.0–2.0)
Basophils Absolute: 0 10*3/uL (ref 0.0–0.1)
HCT: 31.4 % — ABNORMAL LOW (ref 34.8–46.6)
HGB: 11.2 g/dL — ABNORMAL LOW (ref 11.6–15.9)
LYMPH%: 24.2 % (ref 14.0–49.7)
MCHC: 35.6 g/dL (ref 31.5–36.0)
MONO#: 0.1 10*3/uL (ref 0.1–0.9)
NEUT%: 72.9 % (ref 38.4–76.8)
Platelets: 214 10*3/uL (ref 145–400)
WBC: 2.3 10*3/uL — ABNORMAL LOW (ref 3.9–10.3)

## 2011-04-07 LAB — BASIC METABOLIC PANEL
BUN: 9 mg/dL (ref 6–23)
CO2: 26 mEq/L (ref 19–32)
Calcium: 9 mg/dL (ref 8.4–10.5)
Creatinine, Ser: 0.63 mg/dL (ref 0.40–1.20)
Glucose, Bld: 117 mg/dL — ABNORMAL HIGH (ref 70–99)

## 2011-04-09 ENCOUNTER — Encounter: Payer: Self-pay | Admitting: Pulmonary Disease

## 2011-04-14 ENCOUNTER — Other Ambulatory Visit: Payer: Self-pay | Admitting: Oncology

## 2011-04-14 ENCOUNTER — Encounter (HOSPITAL_BASED_OUTPATIENT_CLINIC_OR_DEPARTMENT_OTHER): Payer: BC Managed Care – PPO | Admitting: Oncology

## 2011-04-14 DIAGNOSIS — Z5111 Encounter for antineoplastic chemotherapy: Secondary | ICD-10-CM

## 2011-04-14 DIAGNOSIS — C50119 Malignant neoplasm of central portion of unspecified female breast: Secondary | ICD-10-CM

## 2011-04-14 LAB — CBC WITH DIFFERENTIAL/PLATELET
BASO%: 0.4 % (ref 0.0–2.0)
EOS%: 0.1 % (ref 0.0–7.0)
HGB: 10.9 g/dL — ABNORMAL LOW (ref 11.6–15.9)
MCH: 30.4 pg (ref 25.1–34.0)
MCHC: 33.7 g/dL (ref 31.5–36.0)
MCV: 90.2 fL (ref 79.5–101.0)
MONO%: 7.7 % (ref 0.0–14.0)
RBC: 3.58 10*6/uL — ABNORMAL LOW (ref 3.70–5.45)
RDW: 12.1 % (ref 11.2–14.5)
lymph#: 1.8 10*3/uL (ref 0.9–3.3)

## 2011-04-14 LAB — COMPREHENSIVE METABOLIC PANEL
ALT: 17 U/L (ref 0–35)
AST: 14 U/L (ref 0–37)
Albumin: 4 g/dL (ref 3.5–5.2)
Alkaline Phosphatase: 79 U/L (ref 39–117)
Calcium: 9.4 mg/dL (ref 8.4–10.5)
Chloride: 103 mEq/L (ref 96–112)
Potassium: 3.7 mEq/L (ref 3.5–5.3)
Sodium: 140 mEq/L (ref 135–145)

## 2011-04-15 ENCOUNTER — Encounter (HOSPITAL_BASED_OUTPATIENT_CLINIC_OR_DEPARTMENT_OTHER): Payer: BC Managed Care – PPO | Admitting: Oncology

## 2011-04-15 DIAGNOSIS — C50119 Malignant neoplasm of central portion of unspecified female breast: Secondary | ICD-10-CM

## 2011-04-15 DIAGNOSIS — Z5189 Encounter for other specified aftercare: Secondary | ICD-10-CM

## 2011-04-15 NOTE — Discharge Summary (Signed)
[name], [name]                 ACCOUNT NO.:  [id number]  MEDICAL RECORD NO.:  [id number]           PATIENT TYPE:  O  LOCATION:  1317                         FACILITY:  [facility]  PHYSICIAN:  [name], M.D.  DATE OF BIRTH:  11/03/1961  DATE OF ADMISSION:  04/03/2011 DATE OF DISCHARGE:  04/06/2011                              DISCHARGE SUMMARY   ADMISSION DIAGNOSES: 1. Abdominal pain with partial small bowel obstruction. 2. Nausea, vomiting, dehydration. 3. Breast cancer status post systemic chemotherapy with Adriamycin and     Cytoxan.  DISCHARGE DIAGNOSES: 1. Breast cancer, still undergoing chemotherapy. 2. Small bowel obstruction, resolved. 3. Nausea and vomiting, resolved. 4. Leukocytosis due to Neulasta, resolved; now more leukopenia.  HISTORY OF PRESENT ILLNESS:  [name] is a 50 year old woman with history of breast cancer diagnosed with stage 2 T1c, N1 disease.  The patient had a lumpectomy followed by sentinel node dissection.  Her tumor was ER and PR positive, HER-2 negative.  The patient treated with adjuvant systemic chemotherapy.  She has received 2 cycles of Adriamycin and Cytoxan and presented to [facility] on April 03, 2011, with symptoms of nausea, vomiting, and abdominal distention.  Abdominal x- rays showed possible small bowel obstruction.  HOSPITAL COURSE:  From a GI standpoint, the patient was kept n.p.o. initially, started on normal saline hydration and antiemetics was done vigorously.  She did have a CT scan of the abdomen and pelvis done on April 03, 2011, which showed a mildly distended proximal small bowel with a possible partial small obstruction due to the fact that the oral contrast was seen into the nondistended distal small bowel on the right colon.  The patient had followup abdominal films on April 05, 2011, showed the resolving small bowel obstruction at that time.  She again had her diet advanced on April 04, 2011, and subsequently on  the day of April 06, 2011, she has tolerated regular foods.  She is no longer complaining of any nausea, vomiting, or abdominal pain and seems to be clinically very stable.  From a GI standpoint, she had not reported any vomiting, had not reported any diarrhea, had not reported any changes in her bowel habits and she tolerated soft food very well and the plan is to advance her to a regular diet today on April 06, 2011, and if she tolerates it, she will be discharged after that. 1. Leukocytosis.  On admission, her white cell count was noted to be     elevated at 41,500 probably due to growth factor support.  Her CBC     on April 05, 2011, was 6.1 and on April 06, 2011, white cell count     was 1.8, hemoglobin was 10.1, platelet count of 150.  Anticipation     of a nadir of her white cell count should be coming soon, however,     again her ANC is clearly above 500.  She was afebrile.  She felt     well.  So, we anticipate to monitor that as an outpatient basis. 2. Low-grade temperature.  Her T max was 100.2  on April 05, 2011.  So,     again, she is asymptomatic.  Her chest x-ray was clear and again on     day of discharge, her temperature was 98.7 and had no localizing     signs or symptoms to suggest any infectious process.  DISCHARGE INSTRUCTIONS:  She will be discharged in good condition.  DISCHARGE MEDICATIONS:  She will be on: 1. Klonopin at 1 mg at bedtime. 2. She will take dexamethasone 4 mg before next chemotherapy. 3. Compazine 10 mg q.6 h as needed. 4. Zofran 8 mg 1 tablet q.12 h. 5. Simvastatin 40 mg daily. 6. Celexa 20 mg daily. 7. Atenolol 25 mg half a tablet daily.  FOLLOWUP:  She will follow up at the cancer center on April 07, 2011, at 1:45 for her regular scheduled appointments.  CONDITION ON DISCHARGE:  Good.  DISCHARGE PHYSICAL EXAMINATION:  GENERAL:  On examination, she was awake, alert, not in any distress. VITAL SIGNS:  Blood pressure is 111/69, pulse 67,  respirations 18.  She is satting 97% on room air. HEENT:  Head is normocephalic, atraumatic.  Pupils equal, round, and reactive to light.  Oral mucosa moist and pink. NECK:  Supple without lymphadenopathy. HEART:  Regular rate and rhythm, S1 and S2. LUNGS:  Clear to auscultation. ABDOMEN:  Soft, nontender.  No hepatosplenomegaly. EXTREMITIES:  No edema.  LABORATORY DATA:  Reviewed on the day of discharge.  Her potassium was 3.8.  Her creatinine was 0.58.  Sodium of 138.  Her CBC showed white cell count of 1.8, hemoglobin of 10.1, platelet count of 150.          ______________________________ [name], M.D.     FNS/MEDQ  D:  04/06/2011  T:  04/06/2011  Job:  [id number]  Electronically Signed by Eli Hose  on 04/15/2011 01:44:32 PM

## 2011-04-21 ENCOUNTER — Other Ambulatory Visit: Payer: Self-pay | Admitting: Oncology

## 2011-04-21 ENCOUNTER — Encounter (HOSPITAL_BASED_OUTPATIENT_CLINIC_OR_DEPARTMENT_OTHER): Payer: BC Managed Care – PPO | Admitting: Oncology

## 2011-04-21 DIAGNOSIS — C50119 Malignant neoplasm of central portion of unspecified female breast: Secondary | ICD-10-CM

## 2011-04-21 LAB — CBC WITH DIFFERENTIAL/PLATELET
BASO%: 0.1 % (ref 0.0–2.0)
Basophils Absolute: 0 10*3/uL (ref 0.0–0.1)
EOS%: 0.2 % (ref 0.0–7.0)
HCT: 33.2 % — ABNORMAL LOW (ref 34.8–46.6)
HGB: 11.6 g/dL (ref 11.6–15.9)
MCH: 32.2 pg (ref 25.1–34.0)
MCHC: 34.7 g/dL (ref 31.5–36.0)
MCV: 92.7 fL (ref 79.5–101.0)
MONO%: 0.7 % (ref 0.0–14.0)
NEUT%: 83.4 % — ABNORMAL HIGH (ref 38.4–76.8)
RDW: 12.5 % (ref 11.2–14.5)
lymph#: 0.6 10*3/uL — ABNORMAL LOW (ref 0.9–3.3)

## 2011-04-21 LAB — BASIC METABOLIC PANEL
BUN: 20 mg/dL (ref 6–23)
Chloride: 97 mEq/L (ref 96–112)
Creatinine, Ser: 1.15 mg/dL (ref 0.40–1.20)

## 2011-04-28 ENCOUNTER — Other Ambulatory Visit: Payer: Self-pay | Admitting: Oncology

## 2011-04-28 ENCOUNTER — Encounter (HOSPITAL_BASED_OUTPATIENT_CLINIC_OR_DEPARTMENT_OTHER): Payer: BC Managed Care – PPO | Admitting: Oncology

## 2011-04-28 DIAGNOSIS — Z5111 Encounter for antineoplastic chemotherapy: Secondary | ICD-10-CM

## 2011-04-28 DIAGNOSIS — C50119 Malignant neoplasm of central portion of unspecified female breast: Secondary | ICD-10-CM

## 2011-04-28 LAB — CBC WITH DIFFERENTIAL/PLATELET
Basophils Absolute: 0.1 10*3/uL (ref 0.0–0.1)
Eosinophils Absolute: 0 10*3/uL (ref 0.0–0.5)
HGB: 10.3 g/dL — ABNORMAL LOW (ref 11.6–15.9)
MCV: 92.6 fL (ref 79.5–101.0)
MONO#: 0.9 10*3/uL (ref 0.1–0.9)
NEUT#: 14.8 10*3/uL — ABNORMAL HIGH (ref 1.5–6.5)
RBC: 3.36 10*6/uL — ABNORMAL LOW (ref 3.70–5.45)
RDW: 13.4 % (ref 11.2–14.5)
WBC: 17.1 10*3/uL — ABNORMAL HIGH (ref 3.9–10.3)
lymph#: 1.3 10*3/uL (ref 0.9–3.3)
nRBC: 0 % (ref 0–0)

## 2011-04-28 LAB — COMPREHENSIVE METABOLIC PANEL
ALT: 14 U/L (ref 0–35)
Albumin: 3.7 g/dL (ref 3.5–5.2)
CO2: 26 mEq/L (ref 19–32)
Calcium: 9.1 mg/dL (ref 8.4–10.5)
Chloride: 103 mEq/L (ref 96–112)
Glucose, Bld: 93 mg/dL (ref 70–99)
Potassium: 4 mEq/L (ref 3.5–5.3)
Sodium: 139 mEq/L (ref 135–145)
Total Bilirubin: 0.2 mg/dL — ABNORMAL LOW (ref 0.3–1.2)
Total Protein: 6.2 g/dL (ref 6.0–8.3)

## 2011-04-29 ENCOUNTER — Encounter (HOSPITAL_BASED_OUTPATIENT_CLINIC_OR_DEPARTMENT_OTHER): Payer: BC Managed Care – PPO | Admitting: Oncology

## 2011-04-29 DIAGNOSIS — C50119 Malignant neoplasm of central portion of unspecified female breast: Secondary | ICD-10-CM

## 2011-04-29 DIAGNOSIS — Z5189 Encounter for other specified aftercare: Secondary | ICD-10-CM

## 2011-05-05 ENCOUNTER — Other Ambulatory Visit: Payer: Self-pay | Admitting: Oncology

## 2011-05-05 ENCOUNTER — Encounter (HOSPITAL_BASED_OUTPATIENT_CLINIC_OR_DEPARTMENT_OTHER): Payer: BC Managed Care – PPO | Admitting: Oncology

## 2011-05-05 DIAGNOSIS — C50119 Malignant neoplasm of central portion of unspecified female breast: Secondary | ICD-10-CM

## 2011-05-05 LAB — CBC WITH DIFFERENTIAL/PLATELET
BASO%: 0.3 % (ref 0.0–2.0)
LYMPH%: 18.6 % (ref 14.0–49.7)
MCHC: 34.7 g/dL (ref 31.5–36.0)
MONO#: 0.1 10*3/uL (ref 0.1–0.9)
NEUT#: 2.3 10*3/uL (ref 1.5–6.5)
Platelets: 222 10*3/uL (ref 145–400)
RBC: 3.18 10*6/uL — ABNORMAL LOW (ref 3.70–5.45)
RDW: 13.6 % (ref 11.2–14.5)
WBC: 3 10*3/uL — ABNORMAL LOW (ref 3.9–10.3)

## 2011-05-05 LAB — BASIC METABOLIC PANEL
CO2: 26 mEq/L (ref 19–32)
Calcium: 9.2 mg/dL (ref 8.4–10.5)
Potassium: 3.7 mEq/L (ref 3.5–5.3)
Sodium: 135 mEq/L (ref 135–145)

## 2011-05-12 ENCOUNTER — Other Ambulatory Visit: Payer: Self-pay | Admitting: Oncology

## 2011-05-12 ENCOUNTER — Encounter (HOSPITAL_BASED_OUTPATIENT_CLINIC_OR_DEPARTMENT_OTHER): Payer: BC Managed Care – PPO | Admitting: Oncology

## 2011-05-12 DIAGNOSIS — Z5111 Encounter for antineoplastic chemotherapy: Secondary | ICD-10-CM

## 2011-05-12 DIAGNOSIS — C50119 Malignant neoplasm of central portion of unspecified female breast: Secondary | ICD-10-CM

## 2011-05-12 DIAGNOSIS — R112 Nausea with vomiting, unspecified: Secondary | ICD-10-CM

## 2011-05-12 LAB — COMPREHENSIVE METABOLIC PANEL
Albumin: 3.6 g/dL (ref 3.5–5.2)
BUN: 9 mg/dL (ref 6–23)
CO2: 28 mEq/L (ref 19–32)
Calcium: 8.9 mg/dL (ref 8.4–10.5)
Chloride: 103 mEq/L (ref 96–112)
Glucose, Bld: 95 mg/dL (ref 70–99)
Potassium: 3.6 mEq/L (ref 3.5–5.3)

## 2011-05-12 LAB — CBC WITH DIFFERENTIAL/PLATELET
Basophils Absolute: 0.1 10*3/uL (ref 0.0–0.1)
HCT: 27.8 % — ABNORMAL LOW (ref 34.8–46.6)
HGB: 9.2 g/dL — ABNORMAL LOW (ref 11.6–15.9)
MONO#: 0.9 10*3/uL (ref 0.1–0.9)
NEUT%: 82.5 % — ABNORMAL HIGH (ref 38.4–76.8)
Platelets: 195 10*3/uL (ref 145–400)
WBC: 11.4 10*3/uL — ABNORMAL HIGH (ref 3.9–10.3)
lymph#: 1 10*3/uL (ref 0.9–3.3)

## 2011-05-13 ENCOUNTER — Encounter: Payer: Self-pay | Admitting: Family Medicine

## 2011-05-15 NOTE — Discharge Summary (Signed)
NAME:  [name], [name]                           ACCOUNT NO.:  [id number]   MEDICAL RECORD NO.:  [id number]                   PATIENT TYPE:  INP   LOCATION:  NA                                   FACILITY:  [facility]   PHYSICIAN:  Maxie Better, M.D.            DATE OF BIRTH:  01/21/1961   DATE OF ADMISSION:  01/17/2004  DATE OF DISCHARGE:                                 DISCHARGE SUMMARY   CHIEF COMPLAINT:  Heavy prolonged menses, dysmenorrhea.   HISTORY OF PRESENT ILLNESS:  This is a 50 year old gravida 2, para 2,  married black female with a history of tubal ligation, last menstrual period  in November of 2004, with menometrorrhagia and dysmenorrhea who is now  admitted for a total abdominal hysterectomy.  The patient's history is  notable for heavy and prolonged bleeding with resultant anemia. She  underwent two-unit packed red blood cells transfusion on December 30, 2003,  secondary to hemoglobin of 8.5.  The patient has also been using Darvocet  for pain management.  The patient was tried on birth control pills without  abatement of her bleeding.  The patient had prior to the transfusion  lightheadedness and fatigue.  She had to use Provera as well as Motrin for  management of bleeding and that was also unsuccessful.  The patient had a  D&C hysteroscopy in October of 2001 secondary to heavy bleeding.  At that  time no submucosal fibroid or endometrial polyps were seen.  Evaluation for  her bleeding included a pelvic MRI for evaluation of possible adenomyosis.  Pelvic MRI done on December 25, 2003, showed diffuse adenomyosis and  multiple small fibroids.  The endometrial stripe was widened, however, it  was thought secondary to the ongoing bleeding.  Ovaries were normal.  Endometrial biopsy on December 12, 2003, showed proliferative type  endometrium thought to be secondary to bleeding.  CBC on January 02, 2004,  showed a hemoglobin of 8.3 and hematocrit of 25.3.  The patient's cycles  prior to the November menstrual was q.month with change of pad every hour  for the first three to four days, increased cramps for two to three days, no  family history of thyroid disease and a normal TSH was noted in February of  2003.  The patient presents for definitive surgical management of her  symptoms.   ALLERGIES:  No known drug allergies.   MEDICATIONS:  1. Lipitor.  2. Darvocet p.r.n.  3. Atenolol 25 mg p.o. daily.  4. Lipitor.  5. Iron supplementation.   PAST MEDICAL HISTORY:  Iron deficiency anemia, gastroesophageal reflux  disease, hypercholesterolemia, chronic hypertension, status post blood  transfusion on January 14, 2004.   PAST SURGICAL HISTORY:  Carpal tunnel surgery, laparoscopic tubal ligation  with lysis of adhesions, diagnostic hysteroscopy and D&C.   PAST OBSTETRICAL HISTORY:  Vaginal delivery x2.   FAMILY HISTORY:  Mother with hypertension.  No genitalia, colon, or  breast  cancer.   SOCIAL HISTORY:  Married.  Two children.  Nonsmoker.   REVIEW OF SYSTEMS:  Positive for the vaginal bleeding, prior history of  blood in stool for which the patient underwent a colonoscopy with finding of  rectal polyps in October of 2003.  She also had a positive H.pylori antibody  in the past.  Positive history of fatigue and lightheadedness prior to the  blood transfusion.  All other systems are negative.   PHYSICAL EXAMINATION:  GENERAL:  Well-developed, well-nourished, obese,  black female in no acute distress.  VITAL SIGNS:  Blood pressure 148/88, weight 253 pounds, height 5 foot 3-1/2  inches.  SKIN:  No lesions.  HEENT:  Anicteric sclerae.  Pale, pink conjunctivae.  Oropharynx negative.  HEART:  Regular rate and rhythm without murmur.  LUNGS:  Clear to auscultation.  BREASTS:  Soft and nontender, no palpable mass.  Upward nipples without  discharge.  NECK:  Supple, no palpable thyroid.  NODES:  No palpable supraclavicular, inguinal, or axillary nodes.   ABDOMEN:  Obese, soft, nontender, and no organomegaly.  BACK:  No CVA tenderness.  PELVIC:  External genitalia with no lesions.  Vagina with positive blood in  the vault.  Cervix was closed, high in the vault.  Uterus was bulky,  anteverted, about 10 to 12 weeks size.  Adnexa with no palpable mass, but  limited by the patient's body habitus.  Bladder palpably normal and  nontender.  RECTAL:  Deferred.  EXTREMITIES:  No edema.   IMPRESSION:  Menometrorrhagia, dysmenorrhea, chronic hypertension,  hypercholesterolemia, exogenous obesity.   PLAN:  Admission, total abdominal hysterectomy, antibiotic prophylaxis,  antiembolic stockings, routine admission labs and orders.  Risks of the  procedure were reviewed including no further childbearing, no menses,  infection, bleeding which may still require additional blood transfusion,  injury to surrounding organ structures such as bladder, bowel, or ureter,  bowel obstruction in the future, adhesive disease in the future, possible  need for surgery in the future due to ovarian or tubal disease, fistula  formation, pros and cons of ovarian preservation was discussed, pros and  cons of supracervical hysterectomy was also discussed.  Postoperative care  and criteria for discharge were reviewed.  All questions were answered.  Will continue antihypertensive medications and cholesterol-reducing agents  postoperatively.                                               Maxie Better, M.D.    [place]/MEDQ  D:  01/16/2004  T:  01/16/2004  Job:  [id number]

## 2011-05-15 NOTE — Op Note (Signed)
[facility]  Patient:    [name], [name]                        MRN: [id number] Proc. Date: 10/08/00 Adm. Date:  [id number] Disc. Date: [id number] Attending:  Maxie Better                           Operative Report  PREOPERATIVE DIAGNOSES:       1. Menorrhagia.                               2. Desires sterilization.  POSTOPERATIVE DIAGNOSES:      1. Menorrhagia.                               2. Uterine fibroids.                               3. Desires sterilization.                               4. Pelvic adhesions, question atypical                                  endometriosis.  PROCEDURES:                   1. Diagnostic hysteroscopy.                               2. Dilatation and curettage.                               3. Laparoscopic tubal ligation with Filshie                                  clips.                               4. Anterior cul-de-sac peritoneal biopsy.  SURGEON:                      Sheronette A. Cherly Hensen, M.D.  ANESTHESIA:                   General with fiberoptic light wand (difficult intubation).  ESTIMATED BLOOD LOSS:  INDICATIONS:                  This is a 50 year old, gravida 2, para 2, female with a last menstrual period of September 30, 2000, who desires permanent sterilization, as well as evaluation for a complaint of menorrhagia.  The patient had had an ultrasound in the office which showed multiple small fibroids with an endometrium that was trilayered and normal ovaries bilaterally.  The sonohysterogram only showed a thickened endometrium.  The risks and benefits of the procedures planned were reviewed with the patient. These procedures are diagnostic hysteroscopy, dilatation and curettage, and laparoscopic tubal ligation.  The failure  rate for the laparoscopic tubal ligation was quoted as 1:500-1:600.  The patient understands that this is nonreversible and the risks of the procedure also include  infection, bleeding, and injury to surrounding organs and structures.  Consent was signed and the patient was transferred to the operating room.  DESCRIPTION OF PROCEDURE:     The procedure was started after general induction of anesthesia was accomplished after the use of a fiberoptic light wand for endotracheal intubation of the patient.  The patient was then placed in the dorsolithotomy position.  Examination under anesthesia revealed an anteverted uterus with no adnexal masses appreciated.  The uterus was felt to be bulky.  The abdomen, perineum, and vagina were thoroughly prepped and draped in the usual fashion.  The bladder was catheterized for a large amount of urine.  The patient was draped sterilely.  A bivalve speculum was placed in the vagina.  A single-tooth tenaculum was placed in the anterior lip of the cervix.  The cervix was then serially dilated up to a #25 Pratt dilator.  A sorbitol-primed diagnostic hysteroscope was introduced into the uterine cavity without difficulty.  Both tubal ostia could be seen.  The posterior wall of the uterus was thickened.  No definite endometrial polyp mass was noted.  The endocervical canal was inspected.  No polyps were noted.  The hysteroscope was then removed.  The cavity was then curetted for a large amount of endometrial tissue.  The hysteroscope was then reinserted.  The cavity was again inspected.  No evidence of submucosal fibroid was seen.  The anterior wall has a small amount of thickening and that was addressed by removing the hysteroscope and recurreting the uterine cavity.  It was felt to be adequate. There was no evidence of submucosal fibroids or endometrial polyps.  The Kahn cannula was introduced into the cervical os and attached to the tenaculum. The speculum was removed.  Attention was then turned to the abdomen where a small infraumbilical incision was then made.  The Veress needle was introduced into the abdominal  cavity.  An opening pressure of 8 was noted.  CO2 2.7 L was insufflated.  The Veress needle was removed.  A 10 mm trocar was introduced in the cavity.  Through this port the diagnostic laparoscope was introduced into the abdomen.  No evidence of injury to the underlying structures were noted. The patient was placed on Trendelenburg.  A panoramic view was accomplished. A normal liver edge was noted.  Under the diaphragm there was evidence of adhesions.  A suprapubic incision was then made and under direct visualization a 5 mm port was introduced through that port.  A probe was then placed and the pelvis was inspected.  The uterus was bulky and irregular.  Both tubes and ovaries were identified.  They were normal.  There was an anterior cul-de-sac adhesion.  The anterior cul-de-sac peritoneum was notable for fluid-filled appearing blebs to the right that was clear, questionable possible atypical endometriosis.  Using the infraumbilical port, a Filshie clip applicator was then used and the Filshie clips were applied to both fallopian tubes in the appropriate area.  The applicator was removed.  A long probe was then utilized to further evaluate the pelvis.  The posterior cul-de-sac was inspected. Filmy adhesions were noted posteriorly as were the same fluid-filled blebs in the posterior cul-de-sac.  A dark endometriotic-like implant was noted in the right posterior wall.  The appendix was inspected and was normal.  A grasper was then placed  through the suprapubic port in order to take a peritoneal biopsy, but was not able to accomplish that in the anterior cul-de-sac.  It was then used to hold and tent the peritoneum through the infraumbilical port. Scissors were then used to resect a portion of the peritoneum that covered the fluid-filled blebs.  The peritoneal biopsy was the removed.  The area had no bleeding and was sent to pathology.  The subcutaneous port was then removed under direct  visualization.  The abdomen was deflated.  The infraumbilical port was removed, taking care not to bring up any underlying structure.  The incisions were injected with 0.25% Marcaine.  The infraumbilical port was  closed with a deep layer of 0 Vicryl through the ring suture.  The skin was approximated using a new glue product.  Attention was then turned to the vagina where instruments were removed from the vagina.  Specimens were the endometrial curetting and the peritoneal biopsy.  The sorbitol deficit was 20. The sponge and instrument counts x 2 were correct.  There were no complications.  The patient tolerated the procedure well and was transferred to the recovery room in stable condition. DD:  10/08/00 TD:  10/10/00 Job: [id number]

## 2011-05-15 NOTE — Discharge Summary (Signed)
NAME:  [name], [name]                           ACCOUNT NO.:  [id number]   MEDICAL RECORD NO.:  [id number]                   PATIENT TYPE:  INP   LOCATION:  9312                                 FACILITY:  [facility]   PHYSICIAN:  Maxie Better, M.D.            DATE OF BIRTH:  March 06, 1961   DATE OF ADMISSION:  01/17/2004  DATE OF DISCHARGE:  01/20/2004                                 DISCHARGE SUMMARY   ADMISSION DIAGNOSES:  1. Menometrorrhagia.  2. Dysmenorrhea.  3. Chronic hypertension.  4. Hypercholesterolemia.  5. Exogenous obesity.   DISCHARGE DIAGNOSES:  1. Menometrorrhagia.  2. Leiomyomata.  3. Dysmenorrhea.  4. Chronic hypertension.  5. Hypercholesterolemia.  6. Exogenous obesity.   PROCEDURE:  Total abdominal hysterectomy with bilateral salpingo-  oophorectomy.   HISTORY OF PRESENT ILLNESS:  A 50 year old gravida 2 para 2 female with a  history of laparoscopic tubal ligation now admitted for total abdominal  hysterectomy secondary to metromenorrhagia and dysmenorrhea.  The patient  has had resultant iron deficiency anemia secondary to ongoing bleeding.  She  received 2 units of packed red cells for hemoglobin of 8.5.  Please see the  dictated H&P for specific details.   HOSPITAL COURSE:  The patient was admitted to [facility].  She was  taken to the operating room where she underwent total abdominal hysterectomy  and bilateral salpingo-oophorectomy.  Please see the operative note for the  details of the surgery.  Her postoperative course was notable for low-grade  temperature secondary to atelectasis.  She had trouble with gas pain.  A CBC  on postoperative day #1 showed a hemoglobin of 7.7, hematocrit 23.3, white  count of 8.4.  She had a temperature maximum of 100.7 postoperative day #2  and was subsequently discharged home after she had been afebrile 24 hours.  Urine culture was sent which was negative.  By postoperative day #3 the  patient was doing better,  she had passed flatus, the gas pain had decreased.  Her incision showed no erythema, induration, or exudates.  The staples were  in place but were subsequently removed.  She was deemed well to be  discharged home.  The patient was otherwise tolerating a regular diet.   DISPOSITION:  Home.   CONDITION:  Stable.   DISCHARGE MEDICATIONS:  1. Iron supplementation one p.o. t.i.d.  2. Motrin 800 mg p.o. q.6-8h. p.r.n. pain.  3. Tylox #30 one to two tablets q.3-4h. p.r.n. pain.   FOLLOW-UP APPOINTMENT:  At [facility] in 4-6 weeks.   DISCHARGE INSTRUCTIONS:  Call for temperature greater than or equal to  100.4.  Nothing per vagina for 4-6 weeks.  No heavy lifting or driving for 2  weeks.  Call if severe abdominal pain, nausea/vomiting, redness from the  incision site, increased pain at the incision site, or drainage from the  incision site, severe abdominal pain, no straining with bowel movement,  soaking a  regular pad every hour or more frequently.                                               Maxie Better, M.D.    [place]/MEDQ  D:  04/01/2004  T:  04/01/2004  Job:  [id number]

## 2011-05-15 NOTE — Op Note (Signed)
   NAME:  [name], [name]                             ACCOUNT NO.:  [id number]   MEDICAL RECORD NO.:  [id number]                    PATIENT TYPE:   LOCATION:                                       FACILITY:   PHYSICIAN:  John C. Madilyn Fireman, M.D.                 DATE OF BIRTH:   DATE OF PROCEDURE:  11/07/2002  DATE OF DISCHARGE:                                 OPERATIVE REPORT   PROCEDURE:  Colonoscopy with polypectomy.   INDICATIONS FOR PROCEDURE:  Rectal bleeding.   DESCRIPTION OF PROCEDURE:  The patient was placed in the left lateral  decubitus position then placed on the pulse monitor with continuous low flow  oxygen delivered by nasal cannula. She was sedated with 80 mg IV Demerol and  8 mg IV Versed. The Olympus video colonoscope was inserted into the rectum  and advanced to the cecum, confirmed by transillumination at McBurney's  point and visualization of the ileocecal valve and appendiceal orifice. The  prep was excellent. The cecum, ascending, transverse, descending and sigmoid  colon all appeared normal with no masses, polyps, diverticula or other  mucosal abnormalities. The rectum revealed a 5 mm polyp at approximately 15  cm from the anal verge and this was fulgurated by hot biopsy. A larger 8 mm  friable appearing polyp with adherent blood attached to it was seen at 10 cm  and removed by snare. The remainder of the rectum appeared normal with only  minimal internal hemorrhoids. The colonoscope was then withdrawn and the  patient returned to the recovery room in stable condition. The patient  tolerated the procedure well and there were no immediate complications.   IMPRESSION:  Rectal polyps.   PLAN:  Await histology.                                               John C. Madilyn Fireman, M.D.    [facility]/MEDQ  D:  11/07/2002  T:  11/07/2002  Job:  [id number]   cc:   Maxie Better, M.D.  [address]  [address]  [place]  [place] [address]  Fax: [phone]

## 2011-05-15 NOTE — Op Note (Signed)
NAME:  [name], [name]                           ACCOUNT NO.:  [id number]   MEDICAL RECORD NO.:  [id number]                   PATIENT TYPE:  INP   LOCATION:  9312                                 FACILITY:  [facility]   PHYSICIAN:  Maxie Better, M.D.            DATE OF BIRTH:  November 16, 1961   DATE OF PROCEDURE:  01/17/2004  DATE OF DISCHARGE:                                 OPERATIVE REPORT   PREOPERATIVE DIAGNOSES:  1. Menometrorrhagia.  2. Dysmenorrhea.  3. Severe iron deficiency anemia.   PROCEDURE:  1. Exploratory laparotomy.  2. Total abdominal hysterectomy.  3. Bilateral salpingectomy.   POSTOPERATIVE DIAGNOSES:  1. Menometrorrhagia.  2. Dysmenorrhea.  3. Severe iron deficiency anemia.   ANESTHESIA:  General.   SURGEON:  Maxie Better, M.D.   ASSISTANT:  Genia Del, M.D.   INDICATIONS:  This is a 50 year old gravida 2, para 2, married black female  with a history of tubal ligation with menometrorrhagia and severe  dysmenorrhea refractory to hormonal therapy, who now presents for definitive  surgical management.  The patient has bled to a hemoglobin of 8.5.  She was  given 2 units of packed red cells on January 14, 2004, and on the day of her  surgery, her hemoglobin had only remained at 8.5.  Risks and benefits of the  procedure have been explained to the patient.  Consent was signed.  The  patient was transferred to the operating room.   DESCRIPTION OF PROCEDURE:  Under adequate general anesthesia, the patient  was placed in the supine position.  She was sterilely prepped and draped in  the usual fashion.  Marcaine 0.25% 10 mL was injected along the planned  Pfannenstiel skin incision.  A Pfannenstiel skin incision was then made,  carried down to the rectus fascia.  The rectus fascia was incised in the  midline and extended bilaterally.  The rectus fascia was then bluntly and  sharply dissected off the rectus muscle in superior and inferior fashion.  The  rectus muscle was split in the midline; the parietal peritoneum was  opened.  A self-retaining retractor Balfour retractor was then placed.  The  bowels were packed upwardly.  Inspection of the pelvis was noted for a  bulky, about 12 weeks size uterus, normal ovaries bilaterally; bilateral,  elongated fallopian tube with Filshie clip noted bilaterally.  Small  adhesions in the right anterior cul-de-sac were noted.  The uterus was then  placed on traction.  The anterior peritoneum in the cul-de-sac was opened  and extended transversely.  The round ligaments were bilaterally suture  ligated and severed using cautery.  This then resulted in the vesicouterine  peritoneum being fully opened.  The bladder was then bluntly and sharply  dissected off the lower uterine segment and displaced inferiorly.  The  posterior leaf of the broad ligament was opened bilaterally.  The  uteroovarian ligaments were bilaterally clamped, cut, and suture  ligated  with 0 Vicryl and free tied with 0 Vicryl.  The uterine vessels were  bilaterally skeletonized.  Uterine vessels were then bilaterally clamped,  cut, and suture ligated with 0 Vicryl x 2.  At that point, the fundus of the  uterus was truncated at the junction of the cervix which allowed for better  mobilization in the surgery.  The cervix was then grasped.  The procedure  was then continued with the cardinal ligaments being bilaterally clamped,  cut, and suture ligated with 0 Vicryl until the cervicovaginal junction was  then reached.  On the left, with the angle of the vaginal cuff being clamped  and cut, the vagina was entered.  The uterosacral ligament was thin on the  right which was clamped, cut, and suture ligated.  The left was not as well-  defined and therefore was incorporated in the cardinal ligament and angled  sutures.  The Jorgenson scissors were then used to circumferentially cut the  cervix from its vaginal attachment with the cervix  subsequently being  removed.  It was noted that there was a small portion of the cervix still  remaining on the anterior portion of the vaginal cuff.  This was then  removed.  The right angle of the vaginal cuff was then secured with modified  Richardson suture.  The vaginal cuff was then Reesed with 0 Vicryl suture,  and then the vagina was closed in a vertical fashion using interrupted  figure-of-eight 0 Vicryl sutures.  There was bleeding noted underneath the  bladder flap area.  This was cauterized.  Small bleeding to the right  posterior aspect of the vaginal cuff was then noted to be as a result of the  peritoneum denudement, and this was reinforced to the vaginal cuff using 0  Vicryl suture.  Good hemostasis was then at that point noted.  Both ovaries  were inspected, good hemostasis noted.  The fallopian tubes were noted to be  flapping into the cul-de-sac and at this point, the decision was made to  remove both fallopian tubes which was done by clamping across the base of  the mesosalpinx bilaterally and securing the pedicle with 0 Vicryl suture.  The left ovary pedicle was attached to the left round ligament.  The right  ovary was not suspended with the right round ligament due to it was still  remaining pretty much at the same level as the left side without any need  for suspension.  The pelvis was then copiously irrigated and suctioned.  Good hemostasis was noted.  The self-retaining retractor was removed.  The  packings were then removed.  The appendix was noted to be normal.  The upper  abdomen was then palpated, and normal palpable liver edge, normal kidneys  palpable.  The omentum was inspected.  Decision was then made to close the  abdomen.  The parietal peritoneum was not closed.  Beyond the surface of the  rectus fascia was inspected, small bleeders cauterized.  The rectus fascia was closed with 0 Vicryl x 2.  The subcutaneous areas irrigated, suctioned,  small bleeders  cauterized, and the skin approximated using Ethicon staples.  Specimens were the uterus and cervix and fallopian tubes.  Estimated blood  loss was 300 mL.  Intraoperative fluid was 2500 mL crystalloid.  Urine  output was 600 mL clear-yellow urine.  Sponge and instrument counts x 2 were  correct.  Complication was none.  The patient tolerated the procedure well,  was transferred  to recovery in stable condition.                                               Maxie Better, M.D.    [place]/MEDQ  D:  01/17/2004  T:  01/18/2004  Job:  [id number]

## 2011-05-19 ENCOUNTER — Other Ambulatory Visit: Payer: Self-pay | Admitting: Oncology

## 2011-05-19 ENCOUNTER — Encounter (HOSPITAL_BASED_OUTPATIENT_CLINIC_OR_DEPARTMENT_OTHER): Payer: BC Managed Care – PPO | Admitting: Oncology

## 2011-05-19 DIAGNOSIS — C50119 Malignant neoplasm of central portion of unspecified female breast: Secondary | ICD-10-CM

## 2011-05-19 DIAGNOSIS — Z5111 Encounter for antineoplastic chemotherapy: Secondary | ICD-10-CM

## 2011-05-19 DIAGNOSIS — Z170 Estrogen receptor positive status [ER+]: Secondary | ICD-10-CM

## 2011-05-19 LAB — CBC WITH DIFFERENTIAL/PLATELET
Eosinophils Absolute: 0 10*3/uL (ref 0.0–0.5)
MCV: 93.1 fL (ref 79.5–101.0)
MONO#: 0.8 10*3/uL (ref 0.1–0.9)
MONO%: 12.4 % (ref 0.0–14.0)
NEUT#: 4.4 10*3/uL (ref 1.5–6.5)
RBC: 3.19 10*6/uL — ABNORMAL LOW (ref 3.70–5.45)
RDW: 15.5 % — ABNORMAL HIGH (ref 11.2–14.5)
WBC: 6.2 10*3/uL (ref 3.9–10.3)
nRBC: 1 % — ABNORMAL HIGH (ref 0–0)

## 2011-05-19 LAB — COMPREHENSIVE METABOLIC PANEL
ALT: 14 U/L (ref 0–35)
CO2: 25 mEq/L (ref 19–32)
Sodium: 139 mEq/L (ref 135–145)
Total Bilirubin: 0.3 mg/dL (ref 0.3–1.2)
Total Protein: 6.7 g/dL (ref 6.0–8.3)

## 2011-05-22 ENCOUNTER — Ambulatory Visit: Payer: BC Managed Care – PPO | Admitting: Gastroenterology

## 2011-05-26 ENCOUNTER — Encounter (HOSPITAL_BASED_OUTPATIENT_CLINIC_OR_DEPARTMENT_OTHER): Payer: BC Managed Care – PPO | Admitting: Oncology

## 2011-05-26 ENCOUNTER — Other Ambulatory Visit: Payer: Self-pay | Admitting: Oncology

## 2011-05-26 DIAGNOSIS — Z5111 Encounter for antineoplastic chemotherapy: Secondary | ICD-10-CM

## 2011-05-26 DIAGNOSIS — C50119 Malignant neoplasm of central portion of unspecified female breast: Secondary | ICD-10-CM

## 2011-05-26 DIAGNOSIS — Z170 Estrogen receptor positive status [ER+]: Secondary | ICD-10-CM

## 2011-05-26 LAB — CBC WITH DIFFERENTIAL/PLATELET
BASO%: 0.8 % (ref 0.0–2.0)
EOS%: 0.5 % (ref 0.0–7.0)
LYMPH%: 15.6 % (ref 14.0–49.7)
MCH: 31 pg (ref 25.1–34.0)
MCHC: 33.3 g/dL (ref 31.5–36.0)
MCV: 92.9 fL (ref 79.5–101.0)
MONO%: 7.7 % (ref 0.0–14.0)
Platelets: 341 10*3/uL (ref 145–400)
RBC: 3.23 10*6/uL — ABNORMAL LOW (ref 3.70–5.45)
WBC: 6 10*3/uL (ref 3.9–10.3)
nRBC: 0 % (ref 0–0)

## 2011-05-26 LAB — COMPREHENSIVE METABOLIC PANEL
ALT: 10 U/L (ref 0–35)
AST: 8 U/L (ref 0–37)
Creatinine, Ser: 0.67 mg/dL (ref 0.40–1.20)
Total Bilirubin: 0.2 mg/dL — ABNORMAL LOW (ref 0.3–1.2)

## 2011-06-02 ENCOUNTER — Encounter (HOSPITAL_BASED_OUTPATIENT_CLINIC_OR_DEPARTMENT_OTHER): Payer: BC Managed Care – PPO | Admitting: Oncology

## 2011-06-02 ENCOUNTER — Other Ambulatory Visit: Payer: Self-pay | Admitting: Oncology

## 2011-06-02 DIAGNOSIS — Z170 Estrogen receptor positive status [ER+]: Secondary | ICD-10-CM

## 2011-06-02 DIAGNOSIS — C50119 Malignant neoplasm of central portion of unspecified female breast: Secondary | ICD-10-CM

## 2011-06-02 DIAGNOSIS — Z5111 Encounter for antineoplastic chemotherapy: Secondary | ICD-10-CM

## 2011-06-02 LAB — COMPREHENSIVE METABOLIC PANEL
ALT: 11 U/L (ref 0–35)
AST: 12 U/L (ref 0–37)
Albumin: 4 g/dL (ref 3.5–5.2)
Calcium: 9 mg/dL (ref 8.4–10.5)
Chloride: 104 mEq/L (ref 96–112)
Potassium: 4 mEq/L (ref 3.5–5.3)

## 2011-06-02 LAB — CBC WITH DIFFERENTIAL/PLATELET
BASO%: 0.6 % (ref 0.0–2.0)
HCT: 28.4 % — ABNORMAL LOW (ref 34.8–46.6)
MCHC: 33.1 g/dL (ref 31.5–36.0)
MONO#: 0.6 10*3/uL (ref 0.1–0.9)
RBC: 3.01 10*6/uL — ABNORMAL LOW (ref 3.70–5.45)
RDW: 16.3 % — ABNORMAL HIGH (ref 11.2–14.5)
WBC: 6.4 10*3/uL (ref 3.9–10.3)
lymph#: 1 10*3/uL (ref 0.9–3.3)
nRBC: 0 % (ref 0–0)

## 2011-06-09 ENCOUNTER — Encounter (HOSPITAL_BASED_OUTPATIENT_CLINIC_OR_DEPARTMENT_OTHER): Payer: BC Managed Care – PPO | Admitting: Oncology

## 2011-06-09 ENCOUNTER — Other Ambulatory Visit: Payer: Self-pay | Admitting: Oncology

## 2011-06-09 DIAGNOSIS — C50119 Malignant neoplasm of central portion of unspecified female breast: Secondary | ICD-10-CM

## 2011-06-09 DIAGNOSIS — Z170 Estrogen receptor positive status [ER+]: Secondary | ICD-10-CM

## 2011-06-09 DIAGNOSIS — Z5111 Encounter for antineoplastic chemotherapy: Secondary | ICD-10-CM

## 2011-06-09 LAB — CBC WITH DIFFERENTIAL/PLATELET
BASO%: 0.6 % (ref 0.0–2.0)
EOS%: 1.3 % (ref 0.0–7.0)
HCT: 28.1 % — ABNORMAL LOW (ref 34.8–46.6)
MCH: 32.1 pg (ref 25.1–34.0)
MCHC: 33.8 g/dL (ref 31.5–36.0)
MCV: 94.9 fL (ref 79.5–101.0)
MONO%: 8.9 % (ref 0.0–14.0)
NEUT%: 75.6 % (ref 38.4–76.8)
RDW: 16.2 % — ABNORMAL HIGH (ref 11.2–14.5)
lymph#: 1.1 10*3/uL (ref 0.9–3.3)

## 2011-06-09 LAB — COMPREHENSIVE METABOLIC PANEL
AST: 12 U/L (ref 0–37)
Albumin: 4 g/dL (ref 3.5–5.2)
Alkaline Phosphatase: 72 U/L (ref 39–117)
BUN: 12 mg/dL (ref 6–23)
Potassium: 3.6 mEq/L (ref 3.5–5.3)
Total Bilirubin: 0.3 mg/dL (ref 0.3–1.2)

## 2011-06-16 ENCOUNTER — Encounter (HOSPITAL_BASED_OUTPATIENT_CLINIC_OR_DEPARTMENT_OTHER): Payer: BC Managed Care – PPO | Admitting: Oncology

## 2011-06-16 ENCOUNTER — Other Ambulatory Visit: Payer: Self-pay | Admitting: Oncology

## 2011-06-16 DIAGNOSIS — Z170 Estrogen receptor positive status [ER+]: Secondary | ICD-10-CM

## 2011-06-16 DIAGNOSIS — C50119 Malignant neoplasm of central portion of unspecified female breast: Secondary | ICD-10-CM

## 2011-06-16 DIAGNOSIS — Z5111 Encounter for antineoplastic chemotherapy: Secondary | ICD-10-CM

## 2011-06-16 LAB — COMPREHENSIVE METABOLIC PANEL
Alkaline Phosphatase: 75 U/L (ref 39–117)
Glucose, Bld: 84 mg/dL (ref 70–99)
Sodium: 137 mEq/L (ref 135–145)
Total Bilirubin: 0.3 mg/dL (ref 0.3–1.2)
Total Protein: 6.9 g/dL (ref 6.0–8.3)

## 2011-06-16 LAB — CBC WITH DIFFERENTIAL/PLATELET
Basophils Absolute: 0 10*3/uL (ref 0.0–0.1)
EOS%: 1.4 % (ref 0.0–7.0)
Eosinophils Absolute: 0.1 10*3/uL (ref 0.0–0.5)
HCT: 28.8 % — ABNORMAL LOW (ref 34.8–46.6)
HGB: 9.6 g/dL — ABNORMAL LOW (ref 11.6–15.9)
LYMPH%: 16.5 % (ref 14.0–49.7)
MCH: 31.6 pg (ref 25.1–34.0)
MCV: 94.7 fL (ref 79.5–101.0)
MONO%: 10 % (ref 0.0–14.0)
NEUT#: 4.2 10*3/uL (ref 1.5–6.5)
NEUT%: 71.4 % (ref 38.4–76.8)
Platelets: 338 10*3/uL (ref 145–400)
RDW: 15.6 % — ABNORMAL HIGH (ref 11.2–14.5)

## 2011-06-23 ENCOUNTER — Other Ambulatory Visit: Payer: Self-pay | Admitting: Oncology

## 2011-06-23 ENCOUNTER — Encounter (HOSPITAL_BASED_OUTPATIENT_CLINIC_OR_DEPARTMENT_OTHER): Payer: BC Managed Care – PPO | Admitting: Oncology

## 2011-06-23 DIAGNOSIS — Z170 Estrogen receptor positive status [ER+]: Secondary | ICD-10-CM

## 2011-06-23 DIAGNOSIS — Z5111 Encounter for antineoplastic chemotherapy: Secondary | ICD-10-CM

## 2011-06-23 DIAGNOSIS — C50119 Malignant neoplasm of central portion of unspecified female breast: Secondary | ICD-10-CM

## 2011-06-23 LAB — CBC WITH DIFFERENTIAL/PLATELET
Basophils Absolute: 0 10*3/uL (ref 0.0–0.1)
Eosinophils Absolute: 0.1 10*3/uL (ref 0.0–0.5)
HGB: 9.1 g/dL — ABNORMAL LOW (ref 11.6–15.9)
LYMPH%: 28.2 % (ref 14.0–49.7)
MCV: 95.5 fL (ref 79.5–101.0)
MONO%: 6.5 % (ref 0.0–14.0)
NEUT#: 2.2 10*3/uL (ref 1.5–6.5)
NEUT%: 62.9 % (ref 38.4–76.8)
Platelets: 340 10*3/uL (ref 145–400)
RBC: 2.86 10*6/uL — ABNORMAL LOW (ref 3.70–5.45)

## 2011-06-23 LAB — COMPREHENSIVE METABOLIC PANEL
Alkaline Phosphatase: 68 U/L (ref 39–117)
BUN: 10 mg/dL (ref 6–23)
Creatinine, Ser: 0.6 mg/dL (ref 0.50–1.10)
Glucose, Bld: 94 mg/dL (ref 70–99)
Total Bilirubin: 0.3 mg/dL (ref 0.3–1.2)

## 2011-06-25 NOTE — H&P (Signed)
Atkinson Atkinson                 ACCOUNT NO.:  [id number]  MEDICAL RECORD NO.:  [id number]           PATIENT TYPE:  O  LOCATION:  1317                         FACILITY:  [facility]  PHYSICIAN:  Drue Second, M.D.     DATE OF BIRTH:  November 28, 1961  DATE OF ADMISSION:  04/03/2011 DATE OF DISCHARGE:                             HISTORY & PHYSICAL   SUBJECTIVE/HISTORY OF PRESENT ILLNESS:  Atkinson Atkinson is a very pleasant 50- year-old [place], [place], woman who is being followed in the [facility] for a history of stage II, T1c, N1 with micrometastatic involvement, invasive lobular carcinoma of the left breast for which she underwent a left lumpectomy with sentinel node dissection revealing a strongly ER, PR positive 100%, HER-2 negative lesion with a Ki-67 of 12%.  She presented, she has been treated in the adjuvant setting and actually presented to our office today specifically day 4 cycle 2 of 4 planned adjuvant dose-dense Adriamycin/Cytoxan with Neulasta granulocytes support on day 2, after complaining of fairly severe abdominal spasms which are "unbelievable."  She does that they started pretty quickly about 2 days ago following her Neulasta injection.  She states she has been having normal bowel movements, that they are not really discolored in any way shape or fashion but last evening she did develop nausea with emesis and admits she has not been able to keep much of anything down.  She really is not taking anything for her abdominal spasm excluding Maalox which has helped just a bit. She denies any fevers, chills, night sweats, shortness of breath or chest pain otherwise.  She denies any abdominal bloating.  She is passing her urine without difficulty.  At the time of her assessment, a CBC was obtained which showed her hemoglobin at 12.9 g, platelet count 253000, WBC at 41500 and [place] of 098[address].  Chemistry panel really within normal limits.  BMET did show an  evidence of potassium at 3.4, calcium of 10.1.  KUB flat and upright though unfortunately showed evidence to suggest an early or partial small bowel obstruction.  Given this finding, it was felt prudent for at least observation admission to possibly convert to an inpatient stay for bowel rest and further assessment.  PAST MEDICAL HISTORY:  As per HPI to include history of anxiety, hypertension, hypercholesterolemia, and vitamin D deficiency.  PAST SURGICAL HISTORY:  S/P left lumpectomy with sentinel node dissection, hysterectomy in the past, carpal tunnel surgery, and tonsillectomy.  ALLERGIES:  No known drug allergies.  MEDICATIONS ON ADMISSION:  Celexa 20 mg p.o. daily, Klonopin 1 mg p.o. daily specifically q.h.s., Lasix 40 mg p.o. daily p.r.n., Zocor 40 mg p.o. daily, Tenoretic 50 one-half tablet p.o. daily, and antiemetics.  FAMILY MEDICAL HISTORY:  Noncontributory.  SOCIAL HISTORY:  Atkinson Atkinson resides in [place], [place], with her husband of 26 years.  They have 2 children, specifically 2 boys, age is 64 and 39, two grandsons age is 5 and 7.  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  She does not drink alcohol, she does not smoke or use illicit drugs.  REVIEW OF SYSTEMS:  As per HPI, otherwise noncontributory.  OBJECTIVE/PHYSICAL EXAM:  VITAL SIGNS:  Blood pressure 140/83, pulse 79, respirations 20, temperature 98.4, weight 229 pounds. HEENT:  Conjunctivae pink.  Sclerae anicteric.  Oropharynx is benign without oral mucositis or candidiasis. LUNGS:  Clear to auscultation. HEART:  Regular rate and rhythm. ABDOMEN:  Normal bowel sounds are present.  With gentle palpation, there is no frank illicit tenderness though the patient does grimace when spasm occurs. EXTREMITIES:  Benign. NEUROLOGIC EXAM:  Nonfocal.  LABORATORY DATA:  Again to repeat, hemoglobin 12.9 g, platelet count 252000, WBC 41500 with an ANC of 91478.  Chemistry panel normal limits except potassium  slightly low at 3.4.  IMPRESSION/RECOMMENDATIONS: 1. Early to partial small bowel obstruction.  The patient to be     admitted for bowel rest to include IV fluid hydration and p.o.     except for medications to be advanced as tolerated.  The patient     will also have a CT of the abdomen obtained for further evaluation. 2. History of a T1c, N1 left breast carcinoma, status post left     lumpectomy with sentinel node dissection for strongly ER and PR     positive and HER-2 negative lesion, currently day 4, cycle 2, for     planned adjuvant dose dense Adriamycin/Cytoxan to be followed and     managed as an outpatient. 3. Leukocytosis, flet to be secondary to her day 3 Neulasta status     though this will also be monitored.  Case has been reviewed with     Dr. Welton Flakes who also agrees with this hospitalization and will be     following the patient during her stay.     Sharyl Nimrod, PA   ______________________________ Drue Second, M.D.    CS/MEDQ  D:  04/03/2011  T:  04/03/2011  Job:  [id number]  Electronically Signed by Sharyl Nimrod P.A. on 05/13/2011 11:54:37 AM Electronically Signed by Drue Second MD on 06/25/2011 07:00:28 PM

## 2011-06-26 ENCOUNTER — Ambulatory Visit: Payer: BC Managed Care – PPO | Admitting: Gastroenterology

## 2011-06-30 ENCOUNTER — Other Ambulatory Visit: Payer: Self-pay | Admitting: Oncology

## 2011-06-30 ENCOUNTER — Encounter (HOSPITAL_BASED_OUTPATIENT_CLINIC_OR_DEPARTMENT_OTHER): Payer: BC Managed Care – PPO | Admitting: Oncology

## 2011-06-30 DIAGNOSIS — C50119 Malignant neoplasm of central portion of unspecified female breast: Secondary | ICD-10-CM

## 2011-06-30 DIAGNOSIS — Z5111 Encounter for antineoplastic chemotherapy: Secondary | ICD-10-CM

## 2011-06-30 DIAGNOSIS — Z170 Estrogen receptor positive status [ER+]: Secondary | ICD-10-CM

## 2011-06-30 LAB — COMPREHENSIVE METABOLIC PANEL
ALT: 17 U/L (ref 0–35)
CO2: 28 mEq/L (ref 19–32)
Calcium: 9.4 mg/dL (ref 8.4–10.5)
Chloride: 101 mEq/L (ref 96–112)
Glucose, Bld: 92 mg/dL (ref 70–99)
Sodium: 139 mEq/L (ref 135–145)
Total Protein: 6.9 g/dL (ref 6.0–8.3)

## 2011-06-30 LAB — CBC WITH DIFFERENTIAL/PLATELET
Basophils Absolute: 0 10*3/uL (ref 0.0–0.1)
Eosinophils Absolute: 0 10*3/uL (ref 0.0–0.5)
HGB: 11.1 g/dL — ABNORMAL LOW (ref 11.6–15.9)
LYMPH%: 35.4 % (ref 14.0–49.7)
MCV: 96 fL (ref 79.5–101.0)
MONO#: 0.2 10*3/uL (ref 0.1–0.9)
MONO%: 7.3 % (ref 0.0–14.0)
NEUT#: 1.8 10*3/uL (ref 1.5–6.5)
Platelets: 383 10*3/uL (ref 145–400)

## 2011-07-07 ENCOUNTER — Other Ambulatory Visit: Payer: Self-pay | Admitting: Oncology

## 2011-07-07 ENCOUNTER — Encounter (HOSPITAL_BASED_OUTPATIENT_CLINIC_OR_DEPARTMENT_OTHER): Payer: BC Managed Care – PPO | Admitting: Oncology

## 2011-07-07 DIAGNOSIS — C50119 Malignant neoplasm of central portion of unspecified female breast: Secondary | ICD-10-CM

## 2011-07-07 DIAGNOSIS — Z170 Estrogen receptor positive status [ER+]: Secondary | ICD-10-CM

## 2011-07-07 LAB — CBC WITH DIFFERENTIAL/PLATELET
BASO%: 0.5 % (ref 0.0–2.0)
Eosinophils Absolute: 0.1 10*3/uL (ref 0.0–0.5)
MCHC: 33.9 g/dL (ref 31.5–36.0)
MONO#: 0.3 10*3/uL (ref 0.1–0.9)
NEUT#: 2.4 10*3/uL (ref 1.5–6.5)
RBC: 3.23 10*6/uL — ABNORMAL LOW (ref 3.70–5.45)
RDW: 14.3 % (ref 11.2–14.5)
WBC: 4 10*3/uL (ref 3.9–10.3)
lymph#: 1.2 10*3/uL (ref 0.9–3.3)
nRBC: 0 % (ref 0–0)

## 2011-07-14 ENCOUNTER — Other Ambulatory Visit: Payer: Self-pay | Admitting: Oncology

## 2011-07-14 ENCOUNTER — Encounter (HOSPITAL_BASED_OUTPATIENT_CLINIC_OR_DEPARTMENT_OTHER): Payer: BC Managed Care – PPO | Admitting: Oncology

## 2011-07-14 DIAGNOSIS — L608 Other nail disorders: Secondary | ICD-10-CM

## 2011-07-14 DIAGNOSIS — C50119 Malignant neoplasm of central portion of unspecified female breast: Secondary | ICD-10-CM

## 2011-07-14 DIAGNOSIS — Z170 Estrogen receptor positive status [ER+]: Secondary | ICD-10-CM

## 2011-07-14 LAB — CBC WITH DIFFERENTIAL/PLATELET
Basophils Absolute: 0 10*3/uL (ref 0.0–0.1)
EOS%: 1.3 % (ref 0.0–7.0)
Eosinophils Absolute: 0.1 10*3/uL (ref 0.0–0.5)
HGB: 11.6 g/dL (ref 11.6–15.9)
MCH: 32.3 pg (ref 25.1–34.0)
MONO#: 0.4 10*3/uL (ref 0.1–0.9)
NEUT#: 2.1 10*3/uL (ref 1.5–6.5)
RDW: 14 % (ref 11.2–14.5)
WBC: 4 10*3/uL (ref 3.9–10.3)
lymph#: 1.4 10*3/uL (ref 0.9–3.3)

## 2011-07-21 ENCOUNTER — Other Ambulatory Visit: Payer: Self-pay | Admitting: Oncology

## 2011-07-21 ENCOUNTER — Encounter (HOSPITAL_BASED_OUTPATIENT_CLINIC_OR_DEPARTMENT_OTHER): Payer: BC Managed Care – PPO | Admitting: Oncology

## 2011-07-21 DIAGNOSIS — C50119 Malignant neoplasm of central portion of unspecified female breast: Secondary | ICD-10-CM

## 2011-07-21 LAB — CBC WITH DIFFERENTIAL/PLATELET
Basophils Absolute: 0 10*3/uL (ref 0.0–0.1)
Eosinophils Absolute: 0.1 10*3/uL (ref 0.0–0.5)
HCT: 38.3 % (ref 34.8–46.6)
HGB: 12.8 g/dL (ref 11.6–15.9)
MCV: 95.3 fL (ref 79.5–101.0)
MONO%: 8.5 % (ref 0.0–14.0)
NEUT#: 2.5 10*3/uL (ref 1.5–6.5)
RDW: 13 % (ref 11.2–14.5)
lymph#: 1.4 10*3/uL (ref 0.9–3.3)

## 2011-07-21 LAB — COMPREHENSIVE METABOLIC PANEL
Albumin: 4.4 g/dL (ref 3.5–5.2)
BUN: 11 mg/dL (ref 6–23)
Calcium: 10.8 mg/dL — ABNORMAL HIGH (ref 8.4–10.5)
Chloride: 101 mEq/L (ref 96–112)
Glucose, Bld: 99 mg/dL (ref 70–99)
Potassium: 4 mEq/L (ref 3.5–5.3)

## 2011-08-05 ENCOUNTER — Ambulatory Visit
Admission: RE | Admit: 2011-08-05 | Discharge: 2011-08-05 | Disposition: A | Discharge status: Home / Self Care | Payer: BC Managed Care – PPO | Source: Ambulatory Visit | Attending: Radiation Oncology | Admitting: Radiation Oncology

## 2011-08-05 DIAGNOSIS — Y842 Radiological procedure and radiotherapy as the cause of abnormal reaction of the patient, or of later complication, without mention of misadventure at the time of the procedure: Secondary | ICD-10-CM | POA: Insufficient documentation

## 2011-08-05 DIAGNOSIS — C773 Secondary and unspecified malignant neoplasm of axilla and upper limb lymph nodes: Secondary | ICD-10-CM | POA: Insufficient documentation

## 2011-08-05 DIAGNOSIS — Z510 Encounter for antineoplastic radiation therapy: Secondary | ICD-10-CM | POA: Insufficient documentation

## 2011-08-05 DIAGNOSIS — Z170 Estrogen receptor positive status [ER+]: Secondary | ICD-10-CM | POA: Insufficient documentation

## 2011-08-05 DIAGNOSIS — L988 Other specified disorders of the skin and subcutaneous tissue: Secondary | ICD-10-CM | POA: Insufficient documentation

## 2011-08-05 DIAGNOSIS — C50919 Malignant neoplasm of unspecified site of unspecified female breast: Secondary | ICD-10-CM | POA: Insufficient documentation

## 2011-08-18 ENCOUNTER — Encounter: Payer: BC Managed Care – PPO | Admitting: Oncology

## 2011-08-18 ENCOUNTER — Other Ambulatory Visit: Payer: Self-pay | Admitting: Oncology

## 2011-08-28 ENCOUNTER — Ambulatory Visit (INDEPENDENT_AMBULATORY_CARE_PROVIDER_SITE_OTHER): Payer: BC Managed Care – PPO | Admitting: Family Medicine

## 2011-08-28 ENCOUNTER — Encounter: Payer: Self-pay | Admitting: Family Medicine

## 2011-08-28 DIAGNOSIS — G4700 Insomnia, unspecified: Secondary | ICD-10-CM

## 2011-08-28 DIAGNOSIS — R609 Edema, unspecified: Secondary | ICD-10-CM

## 2011-08-28 DIAGNOSIS — E785 Hyperlipidemia, unspecified: Secondary | ICD-10-CM

## 2011-08-28 DIAGNOSIS — I10 Essential (primary) hypertension: Secondary | ICD-10-CM

## 2011-08-28 DIAGNOSIS — B351 Tinea unguium: Secondary | ICD-10-CM

## 2011-08-28 MED ORDER — ATENOLOL-CHLORTHALIDONE 50-25 MG PO TABS: 0.5000 | ORAL_TABLET | Freq: Every day | ORAL | Status: DC

## 2011-08-28 MED ORDER — SIMVASTATIN 40 MG PO TABS: 40.0000 mg | ORAL_TABLET | Freq: Every day | ORAL | Status: DC

## 2011-08-28 MED ORDER — CICLOPIROX 8 % EX SOLN: Freq: Every day | CUTANEOUS | Status: AC

## 2011-08-28 MED ORDER — CLONAZEPAM 1 MG PO TABS: 1.0000 mg | ORAL_TABLET | Freq: Every day | ORAL | Status: DC

## 2011-08-28 MED ORDER — FUROSEMIDE 40 MG PO TABS: 40.0000 mg | ORAL_TABLET | Freq: Every day | ORAL | Status: DC

## 2011-08-28 NOTE — Progress Notes (Signed)
  Subjective:    Patient ID: [name], female    DOB: 01-Oct-1961, 50 y.o.   MRN: [id number]  HPI Pt here to discuss fungus on R big toe.  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  It is yellow and peeling back from toe.  Pt has nail polish on her toes.    Review of Systems As directed    Objective:   Physical Exam  Constitutional: She appears well-developed and well-nourished.  Skin:       Pt has nail polish on toes so unable to evaluate toe--- It is peeling back from toe however.           Assessment & Plan:  Toenail fungus---penlac ,  If onc ok with sporonox ( pt just finished chemo but is still going through radiation)---we can send it in for her

## 2011-09-08 ENCOUNTER — Telehealth: Payer: Self-pay

## 2011-09-08 MED ORDER — ATORVASTATIN CALCIUM 20 MG PO TABS: 20.0000 mg | ORAL_TABLET | Freq: Every day | ORAL | Status: DC

## 2011-09-08 NOTE — Telephone Encounter (Signed)
Total cholesterol was 217 and LDL was 147 per Dr.Lowne Zocor is not strong enough. She wants to switch it too Lipitor 20 mg # 30 1 by mouth every evening. Recheck labs in 3 mos NMR,Hep,272.4 . Mssg let to call back      KP

## 2011-09-08 NOTE — Telephone Encounter (Signed)
Discussed with patient and she voiced understanding--Rx faxed and she will call back to schedule 3 mo follow up    KP

## 2011-09-15 ENCOUNTER — Encounter: Payer: Self-pay | Admitting: Family Medicine

## 2011-09-22 ENCOUNTER — Encounter (HOSPITAL_BASED_OUTPATIENT_CLINIC_OR_DEPARTMENT_OTHER): Payer: BC Managed Care – PPO | Admitting: Oncology

## 2011-09-22 ENCOUNTER — Other Ambulatory Visit: Payer: Self-pay | Admitting: Oncology

## 2011-09-22 DIAGNOSIS — Z170 Estrogen receptor positive status [ER+]: Secondary | ICD-10-CM

## 2011-09-22 DIAGNOSIS — C50119 Malignant neoplasm of central portion of unspecified female breast: Secondary | ICD-10-CM

## 2011-09-22 DIAGNOSIS — L608 Other nail disorders: Secondary | ICD-10-CM

## 2011-09-22 LAB — CBC WITH DIFFERENTIAL/PLATELET
BASO%: 0.8 % (ref 0.0–2.0)
Eosinophils Absolute: 0.1 10*3/uL (ref 0.0–0.5)
MONO#: 0.3 10*3/uL (ref 0.1–0.9)
NEUT#: 2 10*3/uL (ref 1.5–6.5)
Platelets: 272 10*3/uL (ref 145–400)
RBC: 4.41 10*6/uL (ref 3.70–5.45)
RDW: 12.7 % (ref 11.2–14.5)
WBC: 3.3 10*3/uL — ABNORMAL LOW (ref 3.9–10.3)
lymph#: 0.7 10*3/uL — ABNORMAL LOW (ref 0.9–3.3)

## 2011-09-23 LAB — VITAMIN D 25 HYDROXY (VIT D DEFICIENCY, FRACTURES): Vit D, 25-Hydroxy: 24 ng/mL — ABNORMAL LOW (ref 30–89)

## 2011-09-23 LAB — COMPREHENSIVE METABOLIC PANEL
ALT: 15 U/L (ref 0–35)
Albumin: 4.4 g/dL (ref 3.5–5.2)
CO2: 27 mEq/L (ref 19–32)
Chloride: 100 mEq/L (ref 96–112)
Glucose, Bld: 98 mg/dL (ref 70–99)
Potassium: 3.4 mEq/L — ABNORMAL LOW (ref 3.5–5.3)
Sodium: 139 mEq/L (ref 135–145)
Total Protein: 7.9 g/dL (ref 6.0–8.3)

## 2011-10-13 ENCOUNTER — Other Ambulatory Visit: Payer: Self-pay | Admitting: Radiation Oncology

## 2011-10-13 ENCOUNTER — Encounter: Payer: BC Managed Care – PPO | Admitting: Oncology

## 2011-10-13 LAB — RESEARCH LABS

## 2011-10-18 ENCOUNTER — Encounter: Payer: Self-pay | Admitting: *Deleted

## 2011-10-28 ENCOUNTER — Ambulatory Visit (HOSPITAL_BASED_OUTPATIENT_CLINIC_OR_DEPARTMENT_OTHER): Payer: BC Managed Care – PPO | Admitting: Oncology

## 2011-10-28 ENCOUNTER — Other Ambulatory Visit: Payer: Self-pay | Admitting: Oncology

## 2011-10-28 DIAGNOSIS — C50919 Malignant neoplasm of unspecified site of unspecified female breast: Secondary | ICD-10-CM

## 2011-10-28 DIAGNOSIS — Z170 Estrogen receptor positive status [ER+]: Secondary | ICD-10-CM

## 2011-10-28 DIAGNOSIS — C50119 Malignant neoplasm of central portion of unspecified female breast: Secondary | ICD-10-CM

## 2011-10-28 DIAGNOSIS — R42 Dizziness and giddiness: Secondary | ICD-10-CM

## 2011-10-28 LAB — COMPREHENSIVE METABOLIC PANEL
ALT: 17 U/L (ref 0–35)
CO2: 29 mEq/L (ref 19–32)
Chloride: 100 mEq/L (ref 96–112)
Sodium: 136 mEq/L (ref 135–145)
Total Bilirubin: 0.2 mg/dL — ABNORMAL LOW (ref 0.3–1.2)
Total Protein: 7.6 g/dL (ref 6.0–8.3)

## 2011-10-28 LAB — CBC WITH DIFFERENTIAL/PLATELET
BASO%: 0.3 % (ref 0.0–2.0)
Basophils Absolute: 0 10*3/uL (ref 0.0–0.1)
EOS%: 2.4 % (ref 0.0–7.0)
MCH: 30.4 pg (ref 25.1–34.0)
MCHC: 33.9 g/dL (ref 31.5–36.0)
MCV: 89.8 fL (ref 79.5–101.0)
MONO%: 9.3 % (ref 0.0–14.0)
NEUT%: 59 % (ref 38.4–76.8)
RDW: 13.2 % (ref 11.2–14.5)
lymph#: 1.1 10*3/uL (ref 0.9–3.3)

## 2011-10-29 ENCOUNTER — Other Ambulatory Visit: Payer: Self-pay | Admitting: Oncology

## 2011-10-29 ENCOUNTER — Ambulatory Visit (INDEPENDENT_AMBULATORY_CARE_PROVIDER_SITE_OTHER): Payer: BC Managed Care – PPO | Admitting: Family Medicine

## 2011-10-29 ENCOUNTER — Encounter: Payer: Self-pay | Admitting: Family Medicine

## 2011-10-29 ENCOUNTER — Telehealth: Payer: Self-pay | Admitting: *Deleted

## 2011-10-29 VITALS — BP 135/80 | HR 65 | Temp 98.3°F | Ht 62.0 in | Wt 218.4 lb

## 2011-10-29 DIAGNOSIS — R42 Dizziness and giddiness: Secondary | ICD-10-CM

## 2011-10-29 MED ORDER — MECLIZINE HCL 25 MG PO TABS: 25.0000 mg | ORAL_TABLET | Freq: Three times a day (TID) | ORAL | Status: DC | PRN

## 2011-10-29 NOTE — Patient Instructions (Signed)
This all seems to be positional Make sure you drink plenty of fluids Change positions SLOWLY Take the Meclizine as needed for dizziness We'll notify you of your lab results If no improvement in the next 1-2 weeks- CALL ME! Hang in there!!!!

## 2011-10-29 NOTE — Progress Notes (Signed)
  Subjective:    Patient ID: [name], female    DOB: [birth year], 50 y.o.   MRN: [id number]  HPI Dizziness- sxs started 2 weeks ago.  Was initially infrequent but has become more frequent.  Will occur w/out position changes.  No vertigo while awake but did have episode of dizziness when changing position in bed.  No nausea.  No med changes.  Eating regularly.  Drinking fluids regularly but less than when in treatment.  No CP, SOB.   Breast cancer- finished radiation 2 weeks ago.   Review of Systems For ROS see HPI     Objective:   Physical Exam  Vitals reviewed. Constitutional: She is oriented to person, place, and time. She appears well-developed and well-nourished. No distress.  HENT:  Head: Normocephalic and atraumatic.  Nose: Nose normal.  Mouth/Throat: Oropharynx is clear and moist. No oropharyngeal exudate.       TMs normal bilaterally   Eyes: Conjunctivae and EOM are normal. Pupils are equal, round, and reactive to light.  Neck: Normal range of motion. Neck supple. No thyromegaly present.  Cardiovascular: Normal rate, regular rhythm, normal heart sounds and intact distal pulses.   Pulmonary/Chest: Effort normal and breath sounds normal. No respiratory distress. She has no wheezes. She has no rales.  Lymphadenopathy:    She has no cervical adenopathy.  Neurological: She is alert and oriented to person, place, and time. She has normal reflexes. No cranial nerve deficit. Coordination normal.       + Dix-Hallpike          Assessment & Plan:

## 2011-10-30 LAB — CBC WITH DIFFERENTIAL/PLATELET
Basophils Absolute: 0 10*3/uL (ref 0.0–0.1)
Eosinophils Absolute: 0.1 10*3/uL (ref 0.0–0.7)
Eosinophils Relative: 4.1 % (ref 0.0–5.0)
MCHC: 34.1 g/dL (ref 30.0–36.0)
MCV: 90.3 fl (ref 78.0–100.0)
Monocytes Absolute: 0.2 10*3/uL (ref 0.1–1.0)
Neutrophils Relative %: 7.5 % — ABNORMAL LOW (ref 43.0–77.0)
Platelets: 255 10*3/uL (ref 150.0–400.0)
WBC: 1.5 10*3/uL — CL (ref 4.5–10.5)

## 2011-10-30 LAB — BASIC METABOLIC PANEL
GFR: 70.75 mL/min (ref >60.00)
Potassium: 3.9 mEq/L (ref 3.5–5.1)
Sodium: 139 mEq/L (ref 135–145)

## 2011-10-30 LAB — TSH: TSH: 1.17 u[IU]/mL (ref 0.35–5.50)

## 2011-11-02 ENCOUNTER — Other Ambulatory Visit (INDEPENDENT_AMBULATORY_CARE_PROVIDER_SITE_OTHER): Payer: BC Managed Care – PPO

## 2011-11-02 DIAGNOSIS — D7289 Other specified disorders of white blood cells: Secondary | ICD-10-CM

## 2011-11-02 LAB — CBC WITH DIFFERENTIAL/PLATELET
Basophils Relative: 0.6 % (ref 0.0–3.0)
Hemoglobin: 13.7 g/dL (ref 12.0–15.0)
Lymphocytes Relative: 33.4 % (ref 12.0–46.0)
Monocytes Relative: 12.2 % — ABNORMAL HIGH (ref 3.0–12.0)
Neutro Abs: 1.7 10*3/uL (ref 1.4–7.7)
RBC: 4.43 Mil/uL (ref 3.87–5.11)
WBC: 3.3 10*3/uL — ABNORMAL LOW (ref 4.5–10.5)

## 2011-11-02 NOTE — Progress Notes (Signed)
12  

## 2011-11-02 NOTE — Progress Notes (Signed)
Addended by: Legrand Como on: 11/02/2011 11:02 AM   Modules accepted: Orders

## 2011-11-03 ENCOUNTER — Telehealth: Payer: Self-pay | Admitting: *Deleted

## 2011-11-03 NOTE — Assessment & Plan Note (Signed)
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  Discussed dx and tx w/ pt- will start meclizine prn.  Encouraged increased fluids.  Will check labs to r/o anemia, abnormal thyroid, or electrolyte abnormalities.  Pt expressed understanding and is in agreement w/ plan.

## 2011-11-04 ENCOUNTER — Other Ambulatory Visit (HOSPITAL_COMMUNITY): Payer: BC Managed Care – PPO

## 2011-11-04 ENCOUNTER — Other Ambulatory Visit: Payer: BC Managed Care – PPO

## 2011-11-09 ENCOUNTER — Ambulatory Visit (INDEPENDENT_AMBULATORY_CARE_PROVIDER_SITE_OTHER): Payer: BC Managed Care – PPO | Admitting: Family Medicine

## 2011-11-09 ENCOUNTER — Encounter: Payer: Self-pay | Admitting: Family Medicine

## 2011-11-09 VITALS — BP 122/80 | HR 79 | Temp 98.6°F | Ht 62.0 in | Wt 216.0 lb

## 2011-11-09 DIAGNOSIS — R42 Dizziness and giddiness: Secondary | ICD-10-CM

## 2011-11-09 MED ORDER — MECLIZINE HCL 25 MG PO TABS: 25.0000 mg | ORAL_TABLET | Freq: Three times a day (TID) | ORAL | Status: DC | PRN

## 2011-11-09 NOTE — Patient Instructions (Signed)
Call me if your symptoms change or worsen- we can always get a head CT at any time Continue the Meclizine as needed Drink your fluids Change positions slowly Try the exercises and see if the dizziness improves Hang in there!!!

## 2011-11-09 NOTE — Progress Notes (Signed)
  Subjective:    Patient ID: [name], female    DOB: [birth year], 50 y.o.   MRN: [id number]  HPI Dizziness- sxs improved temporarily but now feels that sxs 'are more consistent'.  Will have to 'catch my balance when standing'.  No nausea, visual changes.  Describes the sxs as 'longer waves of lightheadedness'.   Review of Systems For ROS see HPI     Objective:   Physical Exam  Vitals reviewed. Constitutional: She is oriented to person, place, and time. She appears well-developed and well-nourished. No distress.  HENT:  Head: Normocephalic and atraumatic.  Nose: Nose normal.  Mouth/Throat: Oropharynx is clear and moist.       TMs normal bilaterally  Eyes: Conjunctivae and EOM are normal. Pupils are equal, round, and reactive to light.       + horizontal nystagmus  Neck: Normal range of motion. Neck supple.  Cardiovascular: Normal rate, regular rhythm, normal heart sounds and intact distal pulses.   Pulmonary/Chest: Effort normal and breath sounds normal. No respiratory distress. She has no wheezes. She has no rales.  Lymphadenopathy:    She has no cervical adenopathy.  Neurological: She is alert and oriented to person, place, and time. She has normal reflexes. No cranial nerve deficit. Coordination normal.          Assessment & Plan:

## 2011-11-12 ENCOUNTER — Ambulatory Visit
Admission: RE | Admit: 2011-11-12 | Discharge: 2011-11-12 | Disposition: A | Discharge status: Home / Self Care | Payer: BC Managed Care – PPO | Source: Ambulatory Visit | Attending: Radiation Oncology | Admitting: Radiation Oncology

## 2011-11-12 ENCOUNTER — Encounter: Payer: Self-pay | Admitting: *Deleted

## 2011-11-12 VITALS — BP 116/81 | HR 74 | Temp 98.2°F | Ht 62.0 in | Wt 218.8 lb

## 2011-11-12 DIAGNOSIS — C50119 Malignant neoplasm of central portion of unspecified female breast: Secondary | ICD-10-CM

## 2011-11-12 NOTE — Progress Notes (Signed)
[id number] Month 1 visit Patient in to clinic today for routine radiation oncology follow-up visit. Month 2 questionnaires completed by patient in clinic today. Photographs obtained per protocol. Patient has minimal skin changes at present (grade 1). She reports that she is no longer using Biafine but is using only Aveeno on her skin at present. She reports that she has not yet started tamoxifen, but is scheduled for a follow-up visit with Dr. Welton Flakes next week to discuss further. Confirmed Month 2 visit date with patient for December 10th at 7:20am, per her request. Patient will also be scheduled for a 6 month post-RT visit with Dr. Michell Heinrich.

## 2011-11-12 NOTE — Progress Notes (Signed)
Here for 1 mth follow up left breast ca. Protocol:pictures taken and survey completed. To see dr. Park Breed 11/17/11 to discuss start of tamoxifen.will also ask about removal of porta-cath.

## 2011-11-15 NOTE — Assessment & Plan Note (Signed)
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  Continue meclizine as needed.  Reviewed supportive care and red flags that should prompt return.  Pt expressed understanding and is in agreement w/ plan.

## 2011-11-16 NOTE — Progress Notes (Signed)
Encounter addended by: Tessa Lerner, RN on: 11/16/2011  3:59 PM<BR>     Documentation filed: Visit Diagnoses, Chief Complaint Section

## 2011-11-17 ENCOUNTER — Encounter: Payer: Self-pay | Admitting: Oncology

## 2011-11-17 ENCOUNTER — Telehealth: Payer: Self-pay | Admitting: Oncology

## 2011-11-17 ENCOUNTER — Other Ambulatory Visit: Payer: Self-pay | Admitting: Oncology

## 2011-11-17 ENCOUNTER — Telehealth (INDEPENDENT_AMBULATORY_CARE_PROVIDER_SITE_OTHER): Payer: Self-pay | Admitting: General Surgery

## 2011-11-17 ENCOUNTER — Other Ambulatory Visit (HOSPITAL_BASED_OUTPATIENT_CLINIC_OR_DEPARTMENT_OTHER): Payer: BC Managed Care – PPO | Admitting: Lab

## 2011-11-17 ENCOUNTER — Ambulatory Visit (HOSPITAL_BASED_OUTPATIENT_CLINIC_OR_DEPARTMENT_OTHER): Payer: BC Managed Care – PPO | Admitting: Oncology

## 2011-11-17 VITALS — BP 146/80 | HR 91 | Temp 98.4°F | Ht 62.0 in | Wt 217.0 lb

## 2011-11-17 DIAGNOSIS — L608 Other nail disorders: Secondary | ICD-10-CM

## 2011-11-17 DIAGNOSIS — C50119 Malignant neoplasm of central portion of unspecified female breast: Secondary | ICD-10-CM

## 2011-11-17 DIAGNOSIS — R42 Dizziness and giddiness: Secondary | ICD-10-CM

## 2011-11-17 DIAGNOSIS — G620 Drug-induced polyneuropathy: Secondary | ICD-10-CM

## 2011-11-17 DIAGNOSIS — Z170 Estrogen receptor positive status [ER+]: Secondary | ICD-10-CM

## 2011-11-17 DIAGNOSIS — I10 Essential (primary) hypertension: Secondary | ICD-10-CM

## 2011-11-17 DIAGNOSIS — Z5111 Encounter for antineoplastic chemotherapy: Secondary | ICD-10-CM

## 2011-11-17 DIAGNOSIS — C50919 Malignant neoplasm of unspecified site of unspecified female breast: Secondary | ICD-10-CM

## 2011-11-17 LAB — CBC WITH DIFFERENTIAL/PLATELET
BASO%: 0.5 % (ref 0.0–2.0)
Basophils Absolute: 0 10*3/uL (ref 0.0–0.1)
EOS%: 2 % (ref 0.0–7.0)
HGB: 12.8 g/dL (ref 11.6–15.9)
MCH: 30.3 pg (ref 25.1–34.0)
MONO#: 0.4 10*3/uL (ref 0.1–0.9)
RDW: 13.8 % (ref 11.2–14.5)
WBC: 3.3 10*3/uL — ABNORMAL LOW (ref 3.9–10.3)
lymph#: 1.1 10*3/uL (ref 0.9–3.3)

## 2011-11-17 LAB — COMPREHENSIVE METABOLIC PANEL
ALT: 16 U/L (ref 0–35)
AST: 15 U/L (ref 0–37)
Albumin: 4.1 g/dL (ref 3.5–5.2)
BUN: 13 mg/dL (ref 6–23)
CO2: 28 mEq/L (ref 19–32)
Calcium: 9.6 mg/dL (ref 8.4–10.5)
Chloride: 103 mEq/L (ref 96–112)
Potassium: 4.2 mEq/L (ref 3.5–5.3)

## 2011-11-17 MED ORDER — TAMOXIFEN CITRATE 20 MG PO TABS: 20.0000 mg | ORAL_TABLET | Freq: Every day | ORAL | Status: DC

## 2011-11-17 NOTE — Progress Notes (Signed)
OFFICE PROGRESS NOTE  CC [name] M.D. [name] M.D.  Alicia Rhymes, MD, MD [phone] 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  PRIOR THERAPY:  #1 patient is status post lumpectomy with sentinel node biopsy. The final pathology revealed a 1.2 cm invasive lobular carcinoma. One sentinel node was positive for micrometastatic disease. The tumor was grade 3 there was estrogen receptor +100% progesterone receptor +100% proliferation marker 12% and HER-2/neu negative.  #2 the patient underwent Oncotype DX studies on her tissue that showed a recurrence score of 22 with an average risk of 14% with 5 years of tamoxifen.  #3 she then received adjuvant chemotherapy initially consisting of Adriamycin Cytoxan administered from 03/17/2011 to 04/28/2011. She then went on to receive single agent Taxol from 05/12/2011 2 06/30/2011 for a total of 8 weeks.  #4 the patient received radiation therapy from 08/28/2011 and completed on 10/13/2011.  CURRENT THERAPY: Patient to begin tamoxifen 20 mg daily starting today.  INTERVAL HISTORY: [name] 50 y.o. female returns for followup visit. She was last seen by me on 10/28/2011. At that time she complained of having extreme dizziness. She was not sure why was coming on. We had ordered a CT scan of the brain however she counseled that. My index of suspicion was that she most likely had an earache in her ear infection or a process going on. She has been seen by Dr. Beverely Atkinson who apparently is felt the same thing. Her dizziness has no improved significantly. She is eating well she is feeling well.  MEDICAL HISTORY: Past Medical History  Diagnosis Date  . Metabolic syndrome   . Hypertension   . Hyperlipidemia   . Depression   . Obesity   . Adenocarcinoma, breast   . Vitamin D deficiency   . Anxiety      ALLERGIES:   has no known allergies.  MEDICATIONS:  Current Outpatient Prescriptions  Medication Sig Dispense Refill  . atenolol-chlorthalidone (TENORETIC) 50-25 MG per tablet Take 0.5 tablets by mouth daily.  30 tablet  5  . atorvastatin (LIPITOR) 20 MG tablet Take 1 tablet (20 mg total) by mouth daily.  30 tablet  2  . Cholecalciferol (VITAMIN D) 2000 UNITS tablet Take 2,000 Units by mouth daily.        . ciclopirox (PENLAC) 8 % solution Apply topically at bedtime. Apply over nail and surrounding skin. Apply daily over previous coat. After seven (7) days, may remove with alcohol and continue cycle.  6.6 mL  0  . clonazePAM (KLONOPIN) 1 MG tablet Take 1 tablet (1 mg total) by mouth at bedtime.  30 tablet  2  . furosemide (LASIX) 40 MG tablet Take 1 tablet (40 mg total) by mouth daily.  30 tablet  11  . meclizine (ANTIVERT) 25 MG tablet Take 1 tablet (25 mg total) by mouth 3 (three) times daily as needed for dizziness or nausea.  45 tablet  1  . simvastatin (ZOCOR) 40 MG tablet       . tamoxifen (NOLVADEX) 20 MG tablet Take 1 tablet (20 mg total) by mouth daily.  30 tablet  12    SURGICAL HISTORY:  Past Surgical History  Procedure Date  . Total abdominal hysterectomy 2005  . Btl   . Tonsillectomy   . Carpal tunnel release     REVIEW OF SYSTEMS:  Eyes: negative Ears, nose, mouth,  throat, and face: negative Respiratory: negative Cardiovascular: negative Gastrointestinal: negative Genitourinary:negative Integument/breast: negative except for breast tenderness, skin color change and And surgical changes in the left breast. The right breast no masses or nipple discharge. Hematologic/lymphatic: negative Musculoskeletal:negative Neurological: negative   PHYSICAL EXAMINATION: General appearance: alert, cooperative, appears stated age, no distress and mildly obese Neck: no adenopathy, no carotid bruit, no JVD, supple, symmetrical, trachea midline and thyroid not enlarged, symmetric,  no tenderness/mass/nodules Lymph nodes: Cervical, supraclavicular, and axillary nodes normal. Resp: clear to auscultation bilaterally and normal percussion bilaterally Back: symmetric, no curvature. ROM normal. No CVA tenderness. Cardio: regular rate and rhythm, S1, S2 normal, no murmur, click, rub or gallop and normal apical impulse GI: soft, non-tender; bowel sounds normal; no masses,  no organomegaly Extremities: extremities normal, atraumatic, no cyanosis or edema Neurologic: Alert and oriented X 3, normal strength and tone. Normal symmetric reflexes. Normal coordination and gait Mental status: Alert, oriented, thought content appropriate Sensory: normal Motor: grossly normal Reflexes: 2+ and symmetric Bilateral breasts are examined. Left breast reveals a well-healed surgical scar in the upper outer quadrant. There is no nipple retraction no masses no nipple notes other skin changes other than darkening of the skin secondary to radiation therapy. Right breast no masses nipple discharge or retraction  ECOG PERFORMANCE STATUS: 0 - Asymptomatic  Blood pressure 146/80, pulse 91, temperature 98.4 F (36.9 C), temperature source Oral, height 5\' 2"  (1.575 m), weight 217 lb (98.431 kg).  LABORATORY DATA: Lab Results  Component Value Date   WBC 3.3* 11/17/2011   HGB 12.8 11/17/2011   HCT 37.8 11/17/2011   MCV 89.2 11/17/2011   PLT 276 11/17/2011      Chemistry      Component Value Date/Time   NA 139 10/29/2011 1649   K 3.9 10/29/2011 1649   CL 104 10/29/2011 1649   CO2 28 10/29/2011 1649   BUN 16 10/29/2011 1649   CREATININE 1.1 10/29/2011 1649      Component Value Date/Time   CALCIUM 9.5 10/29/2011 1649   ALKPHOS 101 10/28/2011 1539   AST 17 10/28/2011 1539   ALT 17 10/28/2011 1539   BILITOT 0.2* 10/28/2011 1539       RADIOGRAPHIC STUDIES:  No results found.  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  #2 she is status post lumpectomy followed by adjuvant chemotherapy. Chemotherapy consisted of 4 cycles of dose dense Adriamycin and Cytoxan followed by a weeks of single agent Taxol. 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 Because of this HER-2 treatment was stopped at 8 weeks.  #3 patient is status post radiation therapy. And we will been now begin tamoxifen adjuvantly and 20 mg daily.  #4 dizziness has resolved.  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   PLAN:   #1 patient was given a prescription for tamoxifen 20 mg daily. This was called into her pharmacy through a prescription.  #2 she will continue to manage her neuropathy symptomatically.  #3 patient's dizziness is improving and she knows to continue to monitor this closely to monitor her blood pressure and to followup with Dr. Beverely Atkinson.   All questions were answered. The patient knows to call the clinic with any problems, questions or concerns. We can certainly see the patient much sooner if necessary.  I spent 20 minutes counseling the patient face to face. The total time spent  in the appointment was 30 minutes.    Drue Second, MD Medical/Oncology [facility] [phone] (beeper) [phone] (Office)  11/17/2011, 11:37 AM

## 2011-11-17 NOTE — Telephone Encounter (Signed)
Gv pt appt for feb2013.  called Dr. [name] for port removal appt

## 2011-11-17 NOTE — Telephone Encounter (Signed)
PAC removal request from Dr. Welton Flakes Written orders given to Amy, orders need to be keyed in to [facility]

## 2011-11-17 NOTE — Progress Notes (Signed)
CC:   [name], M.D. Maxie Better, M.D. Lorne Skeens. [name], M.D. Drue Second, M.D.  DIAGNOSIS:  T1c N1 mic invasive lobular carcinoma of the left breast.  PREVIOUS RADIATION:  Radiation to a total dose of 60 Gy, completed 10/13/2011.  INTERVAL SINCE TREATMENT:  1 month.  INTERVAL HISTORY:  [name] reports for followup today.  She is doing well. She is pleased with how her breast has healed.  She states she did not put lotion on this morning, but other than that is doing really well. Her hair is growing back and she has a followup appointment scheduled with Dr. Welton Flakes next week to discuss antiestrogen therapy.  Her energy levels are improving.  She was seen by one of our research nurses today.  PHYSICAL EXAMINATION:  General:  She is a pleasant female in no distress, sitting comfortably on the exam room table.  Breasts:  Her skin of the left breast interestingly in the tangent fields is lighter than the rest of her skin.  In the inframammary folds and laterally, there is some darkening.  There are no telangiectasias or moist desquamation.  Overall, the skin is ashy and dry.  IMPRESSION:  T1c N1 mic invasive lobular carcinoma of the left breast, status post radiation with resolved acute effects of treatment.  RECOMMENDATIONS:  [name] looks great.  I have encouraged her to use more lotion on this and use some vitamin E cream.  She has followup with Dr. Welton Flakes regarding antiestrogen therapy.  I will plan on seeing her back in 6 months' time per the [facility] study.    ______________________________ [name], M.D. SW/MEDQ  D:  11/13/2011  T:  11/17/2011  Job:  [id number]

## 2011-11-26 ENCOUNTER — Other Ambulatory Visit (INDEPENDENT_AMBULATORY_CARE_PROVIDER_SITE_OTHER): Payer: Self-pay | Admitting: General Surgery

## 2011-11-26 NOTE — Telephone Encounter (Signed)
Written orders given to OR schedulers, orders need to be entered in EPIC.

## 2011-11-27 ENCOUNTER — Other Ambulatory Visit (INDEPENDENT_AMBULATORY_CARE_PROVIDER_SITE_OTHER): Payer: Self-pay | Admitting: General Surgery

## 2011-11-30 ENCOUNTER — Other Ambulatory Visit: Payer: Self-pay | Admitting: Oncology

## 2011-11-30 ENCOUNTER — Other Ambulatory Visit: Payer: Self-pay | Admitting: *Deleted

## 2011-11-30 DIAGNOSIS — G4700 Insomnia, unspecified: Secondary | ICD-10-CM

## 2011-11-30 DIAGNOSIS — Z853 Personal history of malignant neoplasm of breast: Secondary | ICD-10-CM

## 2011-11-30 MED ORDER — CLONAZEPAM 1 MG PO TABS
1.0000 mg | ORAL_TABLET | Freq: Every day | ORAL | Status: DC
Start: 2011-11-30 — End: 2012-05-26

## 2011-11-30 NOTE — Telephone Encounter (Signed)
Last OV 11-09-11 last refill 08-28-11

## 2011-11-30 NOTE — Telephone Encounter (Signed)
Manually faxed script to pharmacy in chart

## 2011-11-30 NOTE — Telephone Encounter (Signed)
Ok for #60, no refills 

## 2011-12-02 ENCOUNTER — Telehealth: Payer: Self-pay | Admitting: *Deleted

## 2011-12-02 MED ORDER — ZOLPIDEM TARTRATE ER 12.5 MG PO TBCR: 12.5000 mg | EXTENDED_RELEASE_TABLET | Freq: Every evening | ORAL | Status: DC | PRN

## 2011-12-02 NOTE — Telephone Encounter (Signed)
Pt left vm wanting her results. Noted in chart we did not order her last labs. Called pt to advise to contact MD Park Breed with Oncology. Marland Kitchenleft message to have patient return my call.pt advised klonopin is not working tamoxafen on nov 21st pt stated she is having problems falling asleep and staying asleep. Advised pt to stop taking klonopin per verbal order from MD and to start taking Ambein ER/CR for  One month and to follow up with OV in one month to discuss the effectiveness and next steps pt understood. Manually faxed to [facility] [address]

## 2011-12-04 ENCOUNTER — Telehealth: Payer: Self-pay | Admitting: *Deleted

## 2011-12-04 MED ORDER — ZOLPIDEM TARTRATE 10 MG PO TABS: 10.0000 mg | ORAL_TABLET | Freq: Every evening | ORAL | Status: DC | PRN

## 2011-12-04 NOTE — Telephone Encounter (Signed)
I need more info as to why she wants med changed.  Ambien was sent 2 days ago.

## 2011-12-04 NOTE — Telephone Encounter (Signed)
Called pt to ask about that the medication sent in was noted at $178.00 before co-pay and $40.00 after co-pay is there anything cheaper that can be called in or just regular ambein whatever can be cheaper that MD can prescribe

## 2011-12-04 NOTE — Telephone Encounter (Signed)
.  rx faxed to pharmacy, manually. Sent ambein #30 with 1 refill for 10mg  Spoke to pt to advise results/instructions. Pt understood.

## 2011-12-04 NOTE — Telephone Encounter (Signed)
Pt left vm to advise that you had sent her in a new rx and pt wanted to see if she could get something different? Noted ambein sent on 12-02-11

## 2011-12-04 NOTE — Telephone Encounter (Signed)
She can do regular Ambien 10mg , #30, 1 refill

## 2011-12-07 ENCOUNTER — Ambulatory Visit: Payer: BC Managed Care – PPO | Admitting: *Deleted

## 2011-12-07 DIAGNOSIS — C50919 Malignant neoplasm of unspecified site of unspecified female breast: Secondary | ICD-10-CM

## 2011-12-07 NOTE — Progress Notes (Signed)
Patient in to clinic this morning for Month 2 visit. Photographs taken per protocol. Patient reports using Aveeno on skin currently. Skin shows faint skin changes, but skin is intact and without discomfort (grade 1). Patient reports that she began taking tamoxifen on 11/18/2011, the day after she received her prescription. Confirmed that next study visit with photographs and questionnaires will occur in April at the time of her appointment with Dr. Michell Heinrich. Thanked patient for her participation in the study.

## 2011-12-23 ENCOUNTER — Ambulatory Visit (HOSPITAL_BASED_OUTPATIENT_CLINIC_OR_DEPARTMENT_OTHER)
Admission: RE | Admit: 2011-12-23 | Discharge: 2011-12-23 | Disposition: A | Discharge status: Home / Self Care | Payer: BC Managed Care – PPO | Source: Ambulatory Visit | Attending: General Surgery | Admitting: General Surgery

## 2011-12-23 ENCOUNTER — Encounter (HOSPITAL_BASED_OUTPATIENT_CLINIC_OR_DEPARTMENT_OTHER)
Admission: RE | Disposition: A | Discharge status: Home / Self Care | Payer: Self-pay | Source: Ambulatory Visit | Attending: General Surgery

## 2011-12-23 DIAGNOSIS — Z538 Procedure and treatment not carried out for other reasons: Secondary | ICD-10-CM | POA: Insufficient documentation

## 2011-12-23 DIAGNOSIS — Z452 Encounter for adjustment and management of vascular access device: Secondary | ICD-10-CM | POA: Insufficient documentation

## 2011-12-23 SURGERY — MINOR REMOVAL PORT-A-CATH
Anesthesia: LOCAL

## 2011-12-23 SURGICAL SUPPLY — 17 items
BLADE SURG 15 STRL LF DISP TIS (BLADE) IMPLANT
BLADE SURG 15 STRL SS (BLADE)
CHLORAPREP W/TINT 26ML (MISCELLANEOUS) IMPLANT
CLOTH BEACON ORANGE TIMEOUT ST (SAFETY) IMPLANT
DERMABOND ADVANCED (GAUZE/BANDAGES/DRESSINGS)
DERMABOND ADVANCED .7 DNX12 (GAUZE/BANDAGES/DRESSINGS) IMPLANT
ELECT REM PT RETURN 9FT ADLT (ELECTROSURGICAL)
ELECTRODE REM PT RTRN 9FT ADLT (ELECTROSURGICAL) IMPLANT
GLOVE BIOGEL PI IND STRL 8 (GLOVE) IMPLANT
GLOVE BIOGEL PI INDICATOR 8 (GLOVE)
GLOVE SS BIOGEL STRL SZ 7.5 (GLOVE) IMPLANT
GLOVE SUPERSENSE BIOGEL SZ 7.5 (GLOVE)
NEEDLE HYPO 25X1 1.5 SAFETY (NEEDLE) IMPLANT
PENCIL BUTTON HOLSTER BLD 10FT (ELECTRODE) IMPLANT
SUT MON AB 4-0 PC3 18 (SUTURE) IMPLANT
SYR CONTROL 10ML LL (SYRINGE) IMPLANT
TOWEL OR 17X24 6PK STRL BLUE (TOWEL DISPOSABLE) IMPLANT

## 2011-12-28 ENCOUNTER — Encounter (HOSPITAL_BASED_OUTPATIENT_CLINIC_OR_DEPARTMENT_OTHER): Payer: Self-pay | Admitting: General Surgery

## 2011-12-31 ENCOUNTER — Other Ambulatory Visit (INDEPENDENT_AMBULATORY_CARE_PROVIDER_SITE_OTHER): Payer: Self-pay | Admitting: General Surgery

## 2012-01-01 ENCOUNTER — Other Ambulatory Visit: Payer: Self-pay | Admitting: *Deleted

## 2012-01-01 ENCOUNTER — Ambulatory Visit
Admission: RE | Admit: 2012-01-01 | Discharge: 2012-01-01 | Disposition: A | Discharge status: Home / Self Care | Payer: BC Managed Care – PPO | Source: Ambulatory Visit | Attending: Oncology | Admitting: Oncology

## 2012-01-01 DIAGNOSIS — Z853 Personal history of malignant neoplasm of breast: Secondary | ICD-10-CM

## 2012-01-01 DIAGNOSIS — C50919 Malignant neoplasm of unspecified site of unspecified female breast: Secondary | ICD-10-CM

## 2012-01-01 MED ORDER — GABAPENTIN 100 MG PO CAPS: 100.0000 mg | ORAL_CAPSULE | Freq: Two times a day (BID) | ORAL | Status: DC

## 2012-01-05 ENCOUNTER — Telehealth: Payer: Self-pay | Admitting: *Deleted

## 2012-01-05 NOTE — Telephone Encounter (Signed)
Notified pt No local recurrence of cancer

## 2012-01-05 NOTE — Telephone Encounter (Signed)
Message copied by Cooper Render on Tue Jan 05, 2012 10:17 AM ------      Message from: [name]      Created: Mon Jan 04, 2012 12:56 PM       Call patient:no local recurrence of cancer

## 2012-01-14 ENCOUNTER — Ambulatory Visit (INDEPENDENT_AMBULATORY_CARE_PROVIDER_SITE_OTHER): Payer: BC Managed Care – PPO | Admitting: Family Medicine

## 2012-01-14 ENCOUNTER — Encounter: Payer: Self-pay | Admitting: Family Medicine

## 2012-01-14 DIAGNOSIS — F329 Major depressive disorder, single episode, unspecified: Secondary | ICD-10-CM

## 2012-01-14 DIAGNOSIS — G622 Polyneuropathy due to other toxic agents: Secondary | ICD-10-CM

## 2012-01-14 DIAGNOSIS — F32A Depression, unspecified: Secondary | ICD-10-CM

## 2012-01-14 DIAGNOSIS — IMO0002 Reserved for concepts with insufficient information to code with codable children: Secondary | ICD-10-CM

## 2012-01-14 DIAGNOSIS — G4700 Insomnia, unspecified: Secondary | ICD-10-CM

## 2012-01-14 DIAGNOSIS — G620 Drug-induced polyneuropathy: Secondary | ICD-10-CM | POA: Insufficient documentation

## 2012-01-14 DIAGNOSIS — F419 Anxiety disorder, unspecified: Secondary | ICD-10-CM

## 2012-01-14 DIAGNOSIS — F341 Dysthymic disorder: Secondary | ICD-10-CM

## 2012-01-14 MED ORDER — GABAPENTIN 300 MG PO CAPS: 300.0000 mg | ORAL_CAPSULE | Freq: Three times a day (TID) | ORAL | Status: DC

## 2012-01-14 MED ORDER — ZOLPIDEM TARTRATE 10 MG PO TABS: 10.0000 mg | ORAL_TABLET | Freq: Every evening | ORAL | Status: DC | PRN

## 2012-01-14 MED ORDER — DOXYCYCLINE HYCLATE 100 MG PO TABS: 100.0000 mg | ORAL_TABLET | Freq: Two times a day (BID) | ORAL | Status: AC

## 2012-01-14 MED ORDER — CITALOPRAM HYDROBROMIDE 20 MG PO TABS: 20.0000 mg | ORAL_TABLET | Freq: Every day | ORAL | Status: DC

## 2012-01-14 NOTE — Assessment & Plan Note (Signed)
Unchanged.  Pt unable to afford Ambien CR.  Will switch to Ambien.

## 2012-01-14 NOTE — Patient Instructions (Signed)
Follow up in 1 month to recheck mood Restart the Celexa Increase the Neurontin to 300mg  3x/day Start the Doxycycline twice daily for the infection (take w/ food) Apply hot compresses to the area to facilitate drainage Call with any questions or concerns Hang in there!

## 2012-01-14 NOTE — Progress Notes (Signed)
  Subjective:    Patient ID: [name], female    DOB: [birth year], 51 y.o.   MRN: [id number]  HPI Insomnia- was unable to afford Ambien CR, wants to switch to short acting.  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  Wonders about increasing dose- is on 100mg  bid.  Anxiety- 'it's over the top'.  sxs started to flare in October once tx complete.  Crying frequently, short w/ those around her.  Previously on Celexa.  L shoulder bump- painful, started over the weekend, red.  Review of Systems For ROS see HPI     Objective:   Physical Exam  Vitals reviewed. Constitutional: She is oriented to person, place, and time. She appears well-developed and well-nourished. No distress.  Neurological: She is alert and oriented to person, place, and time.  Skin: Skin is warm and dry.       2.5 cm x 2.5 cm erythematous, indurated area on L shoulder.  + TTP  Psychiatric: She has a normal mood and affect. Her behavior is normal. Judgment and thought content normal.          Assessment & Plan:

## 2012-01-14 NOTE — Assessment & Plan Note (Signed)
Pt w/ chemo induced peripheral neuropathy.  Will increase neurontin to 300mg  TID.  Pt expressed understanding and is in agreement w/ plan.

## 2012-01-14 NOTE — Assessment & Plan Note (Signed)
Deteriorated.  Will restart SSRI and follow closely.  Discussed counseling.  Pt already in cancer mentoring program.

## 2012-01-14 NOTE — Assessment & Plan Note (Signed)
New.  Start doxy.  Reviewed supportive care and red flags that should prompt return.  Pt expressed understanding and is in agreement w/ plan.  

## 2012-01-19 ENCOUNTER — Other Ambulatory Visit (INDEPENDENT_AMBULATORY_CARE_PROVIDER_SITE_OTHER): Payer: Self-pay | Admitting: General Surgery

## 2012-02-15 ENCOUNTER — Encounter: Payer: Self-pay | Admitting: Family Medicine

## 2012-02-15 ENCOUNTER — Ambulatory Visit (INDEPENDENT_AMBULATORY_CARE_PROVIDER_SITE_OTHER): Payer: BC Managed Care – PPO | Admitting: Family Medicine

## 2012-02-15 VITALS — BP 120/75 | HR 71 | Temp 98.0°F | Ht 64.0 in | Wt 218.0 lb

## 2012-02-15 DIAGNOSIS — F329 Major depressive disorder, single episode, unspecified: Secondary | ICD-10-CM

## 2012-02-15 DIAGNOSIS — F341 Dysthymic disorder: Secondary | ICD-10-CM

## 2012-02-15 DIAGNOSIS — F32A Depression, unspecified: Secondary | ICD-10-CM

## 2012-02-15 MED ORDER — CITALOPRAM HYDROBROMIDE 40 MG PO TABS: 40.0000 mg | ORAL_TABLET | Freq: Every day | ORAL | Status: DC

## 2012-02-15 NOTE — Patient Instructions (Signed)
Schedule your complete physical for May Double the Celexa you have at home and when you pick up the new script it will be back to 1 tab daily Call with any questions or concerns You look good!  Keep it up!

## 2012-02-15 NOTE — Assessment & Plan Note (Signed)
Improved but not resolved.  Will increase dose of Celexa to 40mg  and follow closely.

## 2012-02-15 NOTE — Progress Notes (Signed)
  Subjective:    Patient ID: [name], female    DOB: 1961-11-14, 51 y.o.   MRN: [id number]  HPI Depression/anxiety- reports feeling 'much better' since restarting Celexa.  Admits to still having bad days.  'i'm better but not all the way back to where i was'.  No side effects from meds.   Review of Systems For ROS see HPI     Objective:   Physical Exam  Vitals reviewed. Constitutional: She appears well-developed and well-nourished. No distress.  Psychiatric: She has a normal mood and affect. Her behavior is normal. Judgment and thought content normal.          Assessment & Plan:

## 2012-02-18 ENCOUNTER — Encounter (INDEPENDENT_AMBULATORY_CARE_PROVIDER_SITE_OTHER): Payer: BC Managed Care – PPO | Admitting: General Surgery

## 2012-02-18 ENCOUNTER — Ambulatory Visit: Payer: BC Managed Care – PPO | Admitting: Family

## 2012-02-18 ENCOUNTER — Other Ambulatory Visit: Payer: BC Managed Care – PPO | Admitting: Lab

## 2012-02-19 ENCOUNTER — Ambulatory Visit (HOSPITAL_BASED_OUTPATIENT_CLINIC_OR_DEPARTMENT_OTHER)
Admission: RE | Admit: 2012-02-19 | Discharge: 2012-02-19 | Disposition: A | Discharge status: Home / Self Care | Payer: BC Managed Care – PPO | Source: Ambulatory Visit | Attending: General Surgery | Admitting: General Surgery

## 2012-02-19 ENCOUNTER — Encounter (HOSPITAL_BASED_OUTPATIENT_CLINIC_OR_DEPARTMENT_OTHER)
Admission: RE | Disposition: A | Discharge status: Home / Self Care | Payer: Self-pay | Source: Ambulatory Visit | Attending: General Surgery

## 2012-02-19 ENCOUNTER — Ambulatory Visit: Payer: BC Managed Care – PPO | Admitting: Family

## 2012-02-19 DIAGNOSIS — C50919 Malignant neoplasm of unspecified site of unspecified female breast: Secondary | ICD-10-CM

## 2012-02-19 DIAGNOSIS — Z452 Encounter for adjustment and management of vascular access device: Secondary | ICD-10-CM | POA: Insufficient documentation

## 2012-02-19 HISTORY — PX: PORT-A-CATH REMOVAL: SHX5289

## 2012-02-19 SURGERY — MINOR REMOVAL PORT-A-CATH
Anesthesia: LOCAL | Site: Chest | Laterality: Right | Wound class: Clean

## 2012-02-19 MED ORDER — SODIUM BICARBONATE 4 % IV SOLN
INTRAVENOUS | Status: DC | PRN
  Administered 2012-02-19: 11 mL via INTRAVENOUS

## 2012-02-19 MED ORDER — HYDROCODONE-ACETAMINOPHEN 5-325 MG PO TABS: 1.0000 | ORAL_TABLET | ORAL | Status: AC | PRN

## 2012-02-19 MED ORDER — LIDOCAINE HCL (PF) 1 % IJ SOLN
INTRAMUSCULAR | Status: DC | PRN
  Administered 2012-02-19: 11 mL

## 2012-02-19 SURGICAL SUPPLY — 18 items
BLADE SURG 15 STRL LF DISP TIS (BLADE) ×1 IMPLANT
BLADE SURG 15 STRL SS (BLADE) ×1
CHLORAPREP W/TINT 26ML (MISCELLANEOUS) ×2 IMPLANT
CLOTH BEACON ORANGE TIMEOUT ST (SAFETY) ×2 IMPLANT
DERMABOND ADVANCED (GAUZE/BANDAGES/DRESSINGS) ×1
DERMABOND ADVANCED .7 DNX12 (GAUZE/BANDAGES/DRESSINGS) ×1 IMPLANT
ELECT REM PT RETURN 9FT ADLT (ELECTROSURGICAL)
ELECTRODE REM PT RTRN 9FT ADLT (ELECTROSURGICAL) IMPLANT
GLOVE BIO SURGEON STRL SZ 6.5 (GLOVE) ×2 IMPLANT
GLOVE BIOGEL PI IND STRL 8 (GLOVE) IMPLANT
GLOVE BIOGEL PI INDICATOR 8 (GLOVE)
GLOVE SS BIOGEL STRL SZ 7.5 (GLOVE) ×1 IMPLANT
GLOVE SUPERSENSE BIOGEL SZ 7.5 (GLOVE) ×1
NEEDLE HYPO 25X1 1.5 SAFETY (NEEDLE) ×2 IMPLANT
PENCIL BUTTON HOLSTER BLD 10FT (ELECTRODE) IMPLANT
SUT MON AB 4-0 PC3 18 (SUTURE) ×2 IMPLANT
SYR CONTROL 10ML LL (SYRINGE) ×2 IMPLANT
TOWEL OR 17X24 6PK STRL BLUE (TOWEL DISPOSABLE) IMPLANT

## 2012-02-19 NOTE — Op Note (Signed)
preop diagnosis: Status post Port-A-Cath  Postop diagnosis: Same  Procedure removal of Port-A-Cath  Patient was brought to the operating room and placed in the supine position. The right anterior chest was sterilely prepped and draped and correct procedure and patient confirmed. The previous incision was anesthetized as well as the surrounding deep soft tissue. The incision was elliptically excised and sharp dissection carried down onto the port and the hub. The catheter was dissected free and removed intact. The 2 Prolene sutures were then removed and the port extracted. The subcutaneous was closed with interrupted 4-0 Monocryl and the skin with subcuticular 4-0 Monocryl and Dermabond. Patient tolerated the procedure well.  Mariella Saa MD, FACS  02/19/2012, 1:06 PM

## 2012-02-19 NOTE — Discharge Instructions (Signed)
Keep incision dry for 24 hours and then may shower and pat dry. Glue dressing will peel off after about 2 weeks. Stitches dissolve. Call as needed for any concerns about the incision such as redness, drainage.

## 2012-02-19 NOTE — H&P (Signed)
  Subjective:    [name] is a 51 y.o. female who presents for Removal of her portacath   Objective:   There were no vitals taken for this visit.  General:  alert and cooperative Skin:  normal Eyes: negative, conjunctivae/corneas clear. PERRL, EOM's intact. Fundi benign.  Lymph Nodes:  Cervical, supraclavicular, and axillary nodes normal. Lungs:  clear to auscultation bilaterally Heart:  regular rate and rhythm Abdomen: normal findings: soft, non-tender  Extremities:  extremities normal, atraumatic, no cyanosis or edema       Plan:   1. Discussed the risk of surgery,  and the risks. The patient understands the risks, any and all questions were answered to the patient's satisfaction.   Date of Surgery Update (To be completed by Attending Surgeon day of surgery.)

## 2012-02-22 ENCOUNTER — Encounter (HOSPITAL_BASED_OUTPATIENT_CLINIC_OR_DEPARTMENT_OTHER): Payer: Self-pay | Admitting: General Surgery

## 2012-02-29 ENCOUNTER — Telehealth: Payer: Self-pay | Admitting: Oncology

## 2012-02-29 ENCOUNTER — Ambulatory Visit (HOSPITAL_BASED_OUTPATIENT_CLINIC_OR_DEPARTMENT_OTHER): Payer: BC Managed Care – PPO | Admitting: Family

## 2012-02-29 VITALS — BP 109/68 | HR 66 | Temp 99.1°F | Ht 64.0 in | Wt 213.9 lb

## 2012-02-29 DIAGNOSIS — C50919 Malignant neoplasm of unspecified site of unspecified female breast: Secondary | ICD-10-CM

## 2012-02-29 DIAGNOSIS — F3289 Other specified depressive episodes: Secondary | ICD-10-CM

## 2012-02-29 DIAGNOSIS — F329 Major depressive disorder, single episode, unspecified: Secondary | ICD-10-CM

## 2012-02-29 DIAGNOSIS — R6882 Decreased libido: Secondary | ICD-10-CM

## 2012-02-29 DIAGNOSIS — F32A Depression, unspecified: Secondary | ICD-10-CM

## 2012-02-29 MED ORDER — TAMOXIFEN CITRATE 20 MG PO TABS: 20.0000 mg | ORAL_TABLET | Freq: Every day | ORAL | Status: DC

## 2012-02-29 MED ORDER — VENLAFAXINE HCL ER 37.5 MG PO CP24: ORAL_CAPSULE | ORAL | Status: DC

## 2012-02-29 NOTE — Telephone Encounter (Signed)
gve the pt her June 2013 appt calendar 

## 2012-03-02 ENCOUNTER — Telehealth: Payer: Self-pay | Admitting: Family Medicine

## 2012-03-02 ENCOUNTER — Encounter: Payer: Self-pay | Admitting: Family

## 2012-03-02 MED ORDER — GABAPENTIN 300 MG PO CAPS: 300.0000 mg | ORAL_CAPSULE | Freq: Three times a day (TID) | ORAL | Status: DC

## 2012-03-02 NOTE — Telephone Encounter (Signed)
rx sent to pharmacy by e-script  

## 2012-03-02 NOTE — Progress Notes (Signed)
[facility] SURVIVOR CLINIC EVALUATION  Name: [name]                  DATE: 03/02/2012 MRN: [id number]                      DOB: 1961-08-07  CC: [name], MD, MD          REFERRING PHYSICIAN: [name], MD  REASON FOR VISIT: Establish care in the Breast Cancer Survivor Clinic.   DIAGNOSIS:  Encounter Diagnoses  Name Primary?  . Depression Yes  . Breast cancer   Stage II invasive lobular carcinoma of the left breast diagnosed December 2011.   PRIOR THERAPY:  1. Lumpectomy with sentinel node biopsy, final pathology revealed a 1.2 cm invasive lobular carcinoma, one sentinel node positive. Grade 3, ER +100% PR +100% proliferation marker 12% and HER-2/neu negative.  2. Oncotype DX recurrence score 22 with average risk of recurrence 14% with 5 years of tamoxifen.  3. Adjuvant chemotherapy with Adriamycin/Cytoxan administered 03/17/2011 - 04/28/2011. Single agent Taxol 05/12/2011 - 06/30/2011 for 8 weeks.  4. Radiation therapy 08/28/2011 - 10/13/2011.   CURRENT THERAPY: Tamoxifen 20 mg daily started 10/2011.  HISTORY OF PRESENT ILLNESS:  Overall doing well, chief complaint of side effects attributable to tamoxifen is hot flashes. Has approximately 12-14 hot flashes daily. Has noticed approximately 10 pound weight gain since initiating tamoxifen. No vaginal dryness or vaginal discharge. Notices mood swings, has long standing history of depression. Recently started on Celexa by her primary care. No headache or blurred vision, no cough or shortness of breath, no abdominal pain, no new bone pain. Appetite is good, and bowel and bladder function are normal. Remainder of a 10 point review of systems is negative. (See self-assessment concerns below).  She tells me she and her husband have not been intimate for over a year and she admits to low libido. This is a long-standing, chronic problem, present before the breast cancer diagnosis. She speaks at length  about this, seems troubled and perplexed by it.   PAST MEDICAL HISTORY:  Past Medical History  Diagnosis Date  . Metabolic syndrome   . Hypertension   . Hyperlipidemia   . Depression   . Obesity   . Adenocarcinoma, breast   . Vitamin d deficiency   . Anxiety      ALLERGIES:  Allergies as of 02/29/2012  . (No Known Allergies)     MEDICATIONS:  Current Outpatient Prescriptions  Medication Sig Dispense Refill  . atenolol-chlorthalidone (TENORETIC) 50-25 MG per tablet Take 0.5 tablets by mouth daily.  30 tablet  5  . clonazePAM (KLONOPIN) 1 MG tablet Take 1 tablet (1 mg total) by mouth at bedtime.  60 tablet  0  . furosemide (LASIX) 40 MG tablet Take 1 tablet (40 mg total) by mouth daily.  30 tablet  11  . simvastatin (ZOCOR) 40 MG tablet       . tamoxifen (NOLVADEX) 20 MG tablet Take 1 tablet (20 mg total) by mouth daily.  90 tablet  3  . atorvastatin (LIPITOR) 20 MG tablet Take 1 tablet (20 mg total) by mouth daily.  30 tablet  2  . Cholecalciferol (VITAMIN D) 2000 UNITS tablet Take 2,000 Units by mouth daily.        Marland Kitchen gabapentin (NEURONTIN) 300 MG capsule Take 1 capsule (300 mg total) by mouth 3 (three) times daily.  90 capsule  1  . meclizine (ANTIVERT)  25 MG tablet Take 1 tablet (25 mg total) by mouth 3 (three) times daily as needed for dizziness or nausea.  45 tablet  1  . venlafaxine (EFFEXOR-XR) 37.5 MG 24 hr capsule Take one tab by mouth daily for 1 week, then increase to 2 tabs by mouth daily.  60 capsule  5     SOCIAL HISTORY: .Marland Kitchen History   Social History  . Marital Status: Married   Occupational History  . Childcare   Social History Main Topics  . Smoking status: Former Smoker    Quit date: 12/28/1989  . Smokeless tobacco: No  . Alcohol Use: Yes     1 glass of wine once a month  . Drug Use: No  . 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    FAMILY HISTORY:    Problem  . Alcohol abuse  . Asthma  . Coronary artery disease  . Hyperlipidemia  . Hypertension    SELF-ASSESSMENT CONCERNS: Physical: Weight gain, tingling or numbness in the hands/feet, memory problems, hot flashes, insomnia Spiritual/Religious: None Practical: None Family/Relationship:None Emotional: Fear of cancer coming back, and feelings of loneliness or isolation. Lifestyle or Information Needs: Support groups, exercise and activity, diet and nutrition, self breast exam.  These issues were discussed in detail and suggestions made for coping with the above.    REVIEW OF SYSTEMS: General: Negative for fever, chills, night sweats,  loss of appetite or weight loss. 12-14 hot flashes daily. HEENT: Negative for headaches, sore  throat, difficulty swallowing, blurred vision or problem with hearing or  sinus congestion. Respiratory: Negative for shortness of breath, cough  or dyspnea on exertion. Cardiovascular: Negative for chest pain,  palpitations or pedal edema. GI: Negative for nausea, vomiting,  diarrhea, constipation, change in bowel habits or blood in the stool.  No jaundice. GU: Negative for painful or frequent urination, change in  color of urine, or decreased urinary stream. Integumentary: Negative  for skin rashes or other suspicious skin lesions. Hematologic: Negative  for easy bruisability or bleeding. Musculoskeletal: Negative for  complaints of pain, arthralgias, arthritis or myalgias.  Neurological/psychiatric: Negative for numbness, focal weakness,  balance problems or coordination difficulties. Chronic depression, no anxiety. Breast: No self-detected breast complaints.   PHYSICAL EXAM: BP 109/68  Pulse 66  Temp(Src) 99.1 F (37.3 C) (Oral)  Ht 5\' 4"  (1.626 m)  Wt 213 lb 14.4 oz (97.024 kg)  BMI 36.72 kg/m2 GENERAL: Well developed, well nourished, in no acute distress.  EENT: No ocular or oral lesions. No stomatitis.  RESPIRATORY: Lungs are clear to  auscultation bilaterally with normal respiratory movement and no accessory muscle use. CARDIAC: No murmur, rub or tachycardia. No upper or lower extremity edema.  GI: Abdomen is soft, no palpable hepatosplenomegaly. No fluid wave. No tenderness. Musculoskeletal: No kyphosis, no tenderness over the spine, ribs or hips. Lymph: No cervical, infraclavicular, axillary or inguinal adenopathy. Breast: In the supine position, with the right arm over the head, the right nipple is everted. No periareolar edema or nipple discharge. No mass in any quadrant or subareolar region. No redness of the skin. No right axillary adenopathy. With the left arm over the head, the left nipple is everted. No periareolar edema or nipple discharge. No mass in any quadrant or subareolar region. At the 10:00 to 2:00 position, and a well-healed remote lumpectomy incision with no nodularity or satellite lesions. Mild  volume deficit underlying the incision. Mid incision is a 1.5 cm x 1 cm fullness, reminiscent of a seroma. No redness of the skin. Hyperpigmentation, owing to radiation therapy. No left axillary adenopathy. Neuro: No focal neurological deficits. Psych: Alert and oriented X 3, appropriate mood and affect.   LABORATORY STUDIES: None drawn today.   LAST BREAST IMAGING: 11/30/2011. No evidence of malignancy.   TEACHING: I addressed the need to screen for other cancers (skin, colon). We reviewed the need for self breast exam and instruction was given. I reiterated symptoms to report and when to call the clinic. Risk reduction strategies per NCCN guidelines were reviewed.   IMPRESSION: 51 year old African American female with: 1. Stage IIA left breast cancer, diagnosed December 2011, no evidence of recurrence. On tamoxifen with good tolerance. 2. Depression, on Celexa by primary care. 3. Hot flashes, severe, attributed to tamoxifen. 4. Low libido, long-standing. 5. Last mammo Dec. 2012.   PLAN: 1. Due to a mild in  interaction with tamoxifen, we will discontinue Celexa in favor of Effexor. Effexor has also been shown to be effective for hot flashes. She will taper her Celexa over the next week, and then began Effexor 37.5 mg daily for 1 week, then increase to 75 mg daily. A prescription is e-scribed. 2. Check vitamin D level on next visit. 3. Dr. Welton Flakes and I will alternate visits, she will return to clinic to see Dr. Welton Flakes in 3 months following NCCN guidelines. 4. Mammogram Dec 2013.  6. Prescription for Tamoxifen, 90 day supply per her request.   DISCUSSION: A total of 100 minutes was spent with the patient. More than 90 minutes were spent on counseling regarding cancer survivor issues and coordination of follow-up care. NCCN guidelines for follow-up care were reviewed and a surveillance plan was outlined. The importance of compliance was stressed.

## 2012-03-02 NOTE — Telephone Encounter (Signed)
90 day rx request: Gabapentin 300 mg capsule

## 2012-04-14 ENCOUNTER — Ambulatory Visit: Payer: BC Managed Care – PPO | Admitting: Radiation Oncology

## 2012-04-18 ENCOUNTER — Other Ambulatory Visit: Payer: Self-pay | Admitting: Family Medicine

## 2012-04-18 NOTE — Telephone Encounter (Signed)
Refill for Simvastatin 40mg  tablet  Requesting 90-days  Last OV 2.18.2013  I do not see that we have prescribed this prescription before. It is on [facility] & in meds lists but doesn't appear to have been written here

## 2012-04-19 NOTE — Telephone Encounter (Signed)
Called pt to clarify if she is still taking this medication per notes pt does see other MD, left vm to have pt call office back to clarify that she is still taking the simvastatin/zocor

## 2012-04-21 ENCOUNTER — Ambulatory Visit
Admission: RE | Admit: 2012-04-21 | Discharge: 2012-04-21 | Disposition: A | Discharge status: Home / Self Care | Payer: BC Managed Care – PPO | Source: Ambulatory Visit | Attending: Radiation Oncology | Admitting: Radiation Oncology

## 2012-04-21 ENCOUNTER — Encounter: Payer: Self-pay | Admitting: Radiation Oncology

## 2012-04-21 ENCOUNTER — Encounter: Payer: Self-pay | Admitting: *Deleted

## 2012-04-21 VITALS — BP 127/78 | HR 62 | Temp 100.5°F | Resp 18 | Wt 215.2 lb

## 2012-04-21 DIAGNOSIS — C50919 Malignant neoplasm of unspecified site of unspecified female breast: Secondary | ICD-10-CM

## 2012-04-21 MED ORDER — SIMVASTATIN 40 MG PO TABS: 40.0000 mg | ORAL_TABLET | Freq: Every day | ORAL | Status: DC

## 2012-04-21 NOTE — Progress Notes (Signed)
Patient in to clinic today for Month 6 visit. Questionnaires completed by patient, and RTOG SOMA evaluation completed by Dr. Michell Heinrich. Photographs were obtained per protocol. Patient reports that she is not using any products on her skin, and has not used anything since she was last using Aveeno at Month 2. She started tamoxifen in November 2012 and continues to take it daily. Patient is aware that there will be a final study visit scheduled for 12 months from the end of RT, with the possibility of a change to the study extending to 18 months. Patient states she is willing to participate in that case, and will be notified of any future changes that may occur in the study. Thanked patient for her participation.

## 2012-04-21 NOTE — Progress Notes (Signed)
   Department of Radiation Oncology  Phone:  [phone] Fax:        [phone]   Name: [name]   DOB: [birth year]  MRN: [id number]    Date: 04/21/2012  Follow Up Visit Note  CC: [name], MD  Diagnosis: T1 C. N0 left breast cancer  Interval since last radiation: 6 months  Allergies: No Known Allergies  Medications:  Current Outpatient Prescriptions  Medication Sig Dispense Refill  . zolpidem (AMBIEN) 5 MG tablet Take 5 mg by mouth at bedtime as needed.      Marland Kitchen atenolol-chlorthalidone (TENORETIC) 50-25 MG per tablet Take 0.5 tablets by mouth daily.  30 tablet  5  . atorvastatin (LIPITOR) 20 MG tablet Take 1 tablet (20 mg total) by mouth daily.  30 tablet  2  . Cholecalciferol (VITAMIN D) 2000 UNITS tablet Take 2,000 Units by mouth daily.        . clonazePAM (KLONOPIN) 1 MG tablet Take 1 tablet (1 mg total) by mouth at bedtime.  60 tablet  0  . furosemide (LASIX) 40 MG tablet Take 1 tablet (40 mg total) by mouth daily.  30 tablet  11  . gabapentin (NEURONTIN) 300 MG capsule Take 1 capsule (300 mg total) by mouth 3 (three) times daily.  90 capsule  1  . meclizine (ANTIVERT) 25 MG tablet Take 1 tablet (25 mg total) by mouth 3 (three) times daily as needed for dizziness or nausea.  45 tablet  1  . simvastatin (ZOCOR) 40 MG tablet       . tamoxifen (NOLVADEX) 20 MG tablet Take 1 tablet (20 mg total) by mouth daily.  90 tablet  3  . venlafaxine (EFFEXOR-XR) 37.5 MG 24 hr capsule Take one tab by mouth daily for 1 week, then increase to 2 tabs by mouth daily.  60 capsule  5  . zolpidem (AMBIEN) 10 MG tablet Take 1 tablet (10 mg total) by mouth at bedtime as needed for sleep.  30 tablet  1    Interval History: [name] presents today for routine followup.  She is feeling well and doing well. She had her Port-A-Cath removed. That scar is still very sensitive. She had a mammogram in January which showed no evidence of recurrence. She is tolerating her tamoxifen well. Her only  complaints are of insomnia.  Physical Exam:   weight is 215 lb 3.2 oz (97.614 kg). Her oral temperature is 100.5 F (38.1 C). Her blood pressure is 127/78 and her pulse is 62. Her respiration is 18.  She has hyperpigmentation over her entire left breast. This is worse laterally. She has no palpable or visible signs of disease recurrence. No palpable abnormalities of the right breast.  IMPRESSION: Left breast cancer status post radiation with resolving acute effects of treatment and residual hyperpigmentation  PLAN:  [name] is a 51 y.o. female is no evidence of disease. I will plan on seeing her back in a year. I have offered her a referral to dermatology for ablation procedure due to her hyperpigmentation. She is declined at this time. I have recommended she try some Mederma on her scar.  She was evaluated by our clinical trial staff and paperwork related to the trial was filled out.     [name], MD

## 2012-04-21 NOTE — Progress Notes (Signed)
HERE TODAY FOR FU OF LEFT BREAST CA.  SKIN LOOKS GOOD , STILL A LITTLE DARK BUT NO DESQUAMATION NOTED, NO PAIN.  ON PROTOCOL, CINDY FROM PROTOCOL HERE TO SEE PT.  STARTED TAMOXIFEN IN NOV

## 2012-04-21 NOTE — Telephone Encounter (Signed)
Spoke to pt and she advised that she is now taking the zocor 40mg  and no longer takes lipitor, sent #90 to pharmacy, pt understood

## 2012-04-29 ENCOUNTER — Telehealth: Payer: Self-pay | Admitting: Family Medicine

## 2012-04-29 MED ORDER — ZOLPIDEM TARTRATE 10 MG PO TABS: 10.0000 mg | ORAL_TABLET | Freq: Every evening | ORAL | Status: DC | PRN

## 2012-04-29 NOTE — Telephone Encounter (Signed)
Called pt to clarify that she is taking the 10mg  of Ambien per last dosage listed in chart is 5mg , pt verified that she is taking the 10mg  of Ambien, faxed the rx to the pharmacy for Ambein 10mg  #30 with 3 refills

## 2012-04-29 NOTE — Telephone Encounter (Signed)
#  30, 3 refills 

## 2012-04-29 NOTE — Telephone Encounter (Signed)
Refill: Zolpidem tartrate 10mg  tablet. Take 1 tablet by mouth at bedtime as needed.

## 2012-04-29 NOTE — Telephone Encounter (Signed)
Last OV and refill noted 01-14-12 for #30 with 1 refill

## 2012-05-02 ENCOUNTER — Encounter: Payer: Self-pay | Admitting: Family Medicine

## 2012-05-02 ENCOUNTER — Ambulatory Visit (INDEPENDENT_AMBULATORY_CARE_PROVIDER_SITE_OTHER): Payer: BC Managed Care – PPO | Admitting: Family Medicine

## 2012-05-02 ENCOUNTER — Encounter: Payer: Self-pay | Admitting: Gastroenterology

## 2012-05-02 DIAGNOSIS — E559 Vitamin D deficiency, unspecified: Secondary | ICD-10-CM

## 2012-05-02 DIAGNOSIS — Z0000 Encounter for general adult medical examination without abnormal findings: Secondary | ICD-10-CM | POA: Insufficient documentation

## 2012-05-02 DIAGNOSIS — E785 Hyperlipidemia, unspecified: Secondary | ICD-10-CM

## 2012-05-02 DIAGNOSIS — I10 Essential (primary) hypertension: Secondary | ICD-10-CM

## 2012-05-02 DIAGNOSIS — Z1211 Encounter for screening for malignant neoplasm of colon: Secondary | ICD-10-CM

## 2012-05-02 LAB — HEPATIC FUNCTION PANEL
ALT: 16 U/L (ref 0–35)
AST: 17 U/L (ref 0–37)
Albumin: 4.1 g/dL (ref 3.5–5.2)
Alkaline Phosphatase: 73 U/L (ref 39–117)
Total Bilirubin: 0.7 mg/dL (ref 0.3–1.2)

## 2012-05-02 LAB — BASIC METABOLIC PANEL
BUN: 15 mg/dL (ref 6–23)
Chloride: 103 mEq/L (ref 96–112)
Glucose, Bld: 90 mg/dL (ref 70–99)
Potassium: 3.8 mEq/L (ref 3.5–5.1)
Sodium: 141 mEq/L (ref 135–145)

## 2012-05-02 LAB — CBC WITH DIFFERENTIAL/PLATELET
Basophils Relative: 0.4 % (ref 0.0–3.0)
Eosinophils Relative: 1.6 % (ref 0.0–5.0)
Lymphocytes Relative: 39.6 % (ref 12.0–46.0)
Neutrophils Relative %: 51.8 % (ref 43.0–77.0)
Platelets: 247 10*3/uL (ref 150.0–400.0)
RBC: 4.12 Mil/uL (ref 3.87–5.11)
WBC: 4.1 10*3/uL — ABNORMAL LOW (ref 4.5–10.5)

## 2012-05-02 LAB — LIPID PANEL
Total CHOL/HDL Ratio: 3
VLDL: 15 mg/dL (ref 0.0–40.0)

## 2012-05-02 LAB — TSH: TSH: 1.01 u[IU]/mL (ref 0.35–5.50)

## 2012-05-02 MED ORDER — TETANUS-DIPHTH-ACELL PERTUSSIS 5-2.5-18.5 LF-MCG/0.5 IM SUSP
0.5000 mL | Freq: Once | INTRAMUSCULAR | Status: AC
  Administered 2012-05-02: 0.5 mL via INTRAMUSCULAR

## 2012-05-02 NOTE — Assessment & Plan Note (Signed)
Check labs.  Replete prn. 

## 2012-05-02 NOTE — Patient Instructions (Signed)
Follow up in 6 months to recheck BP and cholesterol You look great!!! Keep up the good work! We'll notify you of your lab results Take the Ambien every night- it's not worth suffering Call with any questions or concerns Happy Early Birthday!!!

## 2012-05-02 NOTE — Assessment & Plan Note (Signed)
Chronic problem.  Excellent control.  Asymptomatic.  No changes. 

## 2012-05-02 NOTE — Assessment & Plan Note (Signed)
Pt's PE WNL.  UTD on mammo.  Will need referral for colonoscopy as she is turning 51.  Check labs.  Anticipatory guidance provided.

## 2012-05-02 NOTE — Assessment & Plan Note (Signed)
Chronic problem.  Check labs.  Adjust meds prn  

## 2012-05-02 NOTE — Progress Notes (Signed)
  Subjective:    Patient ID: [name], female    DOB: November 16, 1961, 51 y.o.   MRN: [id number]  HPI CPE- UTD on GYN, colonoscopy (>10 yrs ago w/ Dr Arlyce Dice).   Review of Systems Patient reports no vision/ hearing changes, adenopathy, fever, weight change,  persistant/recurrent hoarseness, swallowing issues, chest pain, palpitations, edema, persistant/recurrent cough, hemoptysis, dyspnea (rest/exertional/paroxysmal nocturnal), gastrointestinal bleeding (melena, rectal bleeding), abdominal pain, significant heartburn, bowel changes, GU symptoms (dysuria, hematuria, incontinence), Gyn symptoms (abnormal  bleeding, pain),  syncope, focal weakness, memory loss, numbness & tingling, skin/hair/nail changes, abnormal bruising or bleeding, anxiety, or depression.     Objective:   Physical Exam General Appearance:    Alert, cooperative, no distress, appears stated age  Head:    Normocephalic, without obvious abnormality, atraumatic  Eyes:    PERRL, conjunctiva/corneas clear, EOM's intact, fundi    benign, both eyes  Ears:    Normal TM's and external ear canals, both ears  Nose:   Nares normal, septum midline, mucosa normal, no drainage    or sinus tenderness  Throat:   Lips, mucosa, and tongue normal; teeth and gums normal  Neck:   Supple, symmetrical, trachea midline, no adenopathy;    Thyroid: no enlargement/tenderness/nodules  Back:     Symmetric, no curvature, ROM normal, no CVA tenderness  Lungs:     Clear to auscultation bilaterally, respirations unlabored  Chest Wall:    No tenderness or deformity   Heart:    Regular rate and rhythm, S1 and S2 normal, no murmur, rub   or gallop  Breast Exam:    Deferred to GYN  Abdomen:     Soft, non-tender, bowel sounds active all four quadrants,    no masses, no organomegaly  Genitalia:    Deferred to GYN  Rectal:    Extremities:   Extremities normal, atraumatic, no cyanosis or edema  Pulses:   2+ and symmetric all extremities  Skin:   Skin color,  texture, turgor normal, no rashes or lesions  Lymph nodes:   Cervical, supraclavicular, and axillary nodes normal  Neurologic:   CNII-XII intact, normal strength, sensation and reflexes    throughout          Assessment & Plan:

## 2012-05-03 ENCOUNTER — Encounter: Payer: Self-pay | Admitting: *Deleted

## 2012-05-05 LAB — VITAMIN D 1,25 DIHYDROXY
Vitamin D 1, 25 (OH)2 Total: 44 pg/mL (ref 18–72)
Vitamin D3 1, 25 (OH)2: 44 pg/mL

## 2012-05-06 ENCOUNTER — Encounter: Payer: Self-pay | Admitting: *Deleted

## 2012-05-20 ENCOUNTER — Encounter: Payer: BC Managed Care – PPO | Admitting: Family Medicine

## 2012-05-26 ENCOUNTER — Encounter: Payer: Self-pay | Admitting: Gastroenterology

## 2012-05-26 ENCOUNTER — Ambulatory Visit (AMBULATORY_SURGERY_CENTER): Payer: BC Managed Care – PPO | Admitting: *Deleted

## 2012-05-26 VITALS — Ht 63.0 in | Wt 217.0 lb

## 2012-05-26 DIAGNOSIS — Z1211 Encounter for screening for malignant neoplasm of colon: Secondary | ICD-10-CM

## 2012-05-26 MED ORDER — PEG-KCL-NACL-NASULF-NA ASC-C 100 G PO SOLR: ORAL | Status: DC

## 2012-06-01 ENCOUNTER — Telehealth: Payer: Self-pay | Admitting: *Deleted

## 2012-06-01 NOTE — Telephone Encounter (Signed)
per double booking on 06-03-2012 patient confirmed over the phone the new time but the same day

## 2012-06-03 ENCOUNTER — Ambulatory Visit (HOSPITAL_BASED_OUTPATIENT_CLINIC_OR_DEPARTMENT_OTHER): Payer: BC Managed Care – PPO | Admitting: Family

## 2012-06-03 ENCOUNTER — Telehealth: Payer: Self-pay | Admitting: Oncology

## 2012-06-03 ENCOUNTER — Other Ambulatory Visit (HOSPITAL_BASED_OUTPATIENT_CLINIC_OR_DEPARTMENT_OTHER): Payer: BC Managed Care – PPO | Admitting: Lab

## 2012-06-03 ENCOUNTER — Encounter: Payer: Self-pay | Admitting: Family

## 2012-06-03 VITALS — BP 124/83 | HR 88 | Temp 98.9°F | Ht 63.0 in | Wt 220.6 lb

## 2012-06-03 DIAGNOSIS — C50919 Malignant neoplasm of unspecified site of unspecified female breast: Secondary | ICD-10-CM

## 2012-06-03 DIAGNOSIS — F329 Major depressive disorder, single episode, unspecified: Secondary | ICD-10-CM

## 2012-06-03 DIAGNOSIS — F3289 Other specified depressive episodes: Secondary | ICD-10-CM

## 2012-06-03 DIAGNOSIS — Z170 Estrogen receptor positive status [ER+]: Secondary | ICD-10-CM

## 2012-06-03 LAB — CBC & DIFF AND RETIC
BASO%: 0.8 % (ref 0.0–2.0)
EOS%: 2.3 % (ref 0.0–7.0)
Eosinophils Absolute: 0.1 10*3/uL (ref 0.0–0.5)
LYMPH%: 43.6 % (ref 14.0–49.7)
MCH: 31.2 pg (ref 25.1–34.0)
MCHC: 34.6 g/dL (ref 31.5–36.0)
MCV: 90.2 fL (ref 79.5–101.0)
MONO%: 6.8 % (ref 0.0–14.0)
NEUT#: 1.9 10*3/uL (ref 1.5–6.5)
Platelets: 210 10*3/uL (ref 145–400)
RBC: 4.07 10*6/uL (ref 3.70–5.45)
RDW: 12.3 % (ref 11.2–14.5)
Retic %: 1.47 % (ref 0.70–2.10)
Retic Ct Abs: 59.83 10*3/uL (ref 33.70–90.70)

## 2012-06-03 NOTE — Progress Notes (Signed)
[facility] SURVIVOR CLINIC EVALUATION  Name: [name]                  DATE: 06/03/2012 MRN: [id number]                      DOB: 07-22-1961  CC: [name], MD, MD          REFERRING PHYSICIAN: Sheliah Hatch, MD  REASON FOR VISIT: Follow up after medication change.   DIAGNOSIS:  Encounter Diagnosis  Name Primary?  . Breast cancer Yes  Stage II invasive lobular carcinoma of the left breast diagnosed December 2011.   PRIOR THERAPY:  1. Lumpectomy with sentinel node biopsy, final pathology revealed a 1.2 cm invasive lobular carcinoma, one sentinel node positive. Grade 3, ER +100% PR +100% proliferation marker 12% and HER-2/neu negative.  2. Oncotype DX recurrence score 22 with average risk of recurrence 14% with 5 years of tamoxifen.  3. Adjuvant chemotherapy with Adriamycin/Cytoxan administered 03/17/2011 - 04/28/2011. Single agent Taxol 05/12/2011 - 06/30/2011 for 8 weeks.  4. Radiation therapy 08/28/2011 - 10/13/2011.   CURRENT THERAPY: Tamoxifen 20 mg daily started 10/2011.  HISTORY OF PRESENT ILLNESS:  Overall doing well, hot flashes have improved on Effexor. Has 2-3/day, much milder. Depression is well controlled. No headache or blurred vision, no cough or shortness of breath, no abdominal pain, no new bone pain. Appetite is good, and bowel and bladder function are normal. Remainder of a 10 point review of systems is negative. Mammogram was January, will repeat in 1 year.   Neurological/psychiatric: Negative for numbness, focal weakness,  balance problems or coordination difficulties. Chronic depression, no anxiety. Breast: No self-detected breast complaints.   PHYSICAL EXAM: BP 124/83  Pulse 88  Temp(Src) 98.9 F (37.2 C) (Oral)  Ht 5\' 3"  (1.6 m)  Wt 220 lb 9.6 oz (100.064 kg)  BMI 39.08 kg/m2 GENERAL: Well developed, well nourished, in no acute distress.  EENT: No ocular or oral lesions. No stomatitis.  RESPIRATORY: Lungs are  clear to auscultation bilaterally with normal respiratory movement and no accessory muscle use. CARDIAC: No murmur, rub or tachycardia. No upper or lower extremity edema.  GI: Abdomen is soft, no palpable hepatosplenomegaly. No fluid wave. No tenderness. Musculoskeletal: No kyphosis, no tenderness over the spine, ribs or hips. Lymph: No cervical, infraclavicular, axillary or inguinal adenopathy. Breast: In the supine position, with the right arm over the head, the right nipple is everted. No periareolar edema or nipple discharge. No mass in any quadrant or subareolar region. No redness of the skin. No right axillary adenopathy. With the left arm over the head, the left nipple is everted. No periareolar edema or nipple discharge. No mass in any quadrant or subareolar region. At the 10:00 to 2:00 position, and a well-healed remote lumpectomy incision with no nodularity or satellite lesions. Mild volume deficit underlying the incision. Mid incision is a 1.5 cm x 1 cm fullness, reminiscent of a seroma. No redness of the skin. Hyperpigmentation, owing to radiation therapy. No left axillary adenopathy. Neuro: No focal neurological deficits. Psych: Alert and oriented X 3, appropriate mood and affect.   LABORATORY STUDIES:  Lab Results  Component Value Date   WBC 4.0 06/03/2012   HGB 12.7 06/03/2012   HCT 36.7 06/03/2012   MCV 90.2 06/03/2012   PLT 210 06/03/2012    IMPRESSION: 51 year old African American female with: 1. Stage IIA left breast cancer, diagnosed December 2011,  no evidence of recurrence. On tamoxifen with good tolerance. 2. Depression, now on on Effexor. 3. Hot flashes, improved on Celexa.  4. Low libido, long-standing. 5. Last mammo Jan. 2013.   PLAN: 1. Return to see Dr. Welton Flakes in 4 months. Her CBC is normal. Will not repeat labs in 4 months.  2. Remain on Effexor and Tamoxifen.

## 2012-06-03 NOTE — Telephone Encounter (Signed)
gve the pt her f/u appt for oct

## 2012-06-09 ENCOUNTER — Encounter: Payer: BC Managed Care – PPO | Admitting: Gastroenterology

## 2012-06-17 ENCOUNTER — Telehealth: Payer: Self-pay | Admitting: Gastroenterology

## 2012-06-20 ENCOUNTER — Encounter: Payer: BC Managed Care – PPO | Admitting: Gastroenterology

## 2012-06-20 NOTE — Telephone Encounter (Signed)
yes

## 2012-07-08 ENCOUNTER — Encounter: Payer: Self-pay | Admitting: Gastroenterology

## 2012-07-08 ENCOUNTER — Ambulatory Visit (AMBULATORY_SURGERY_CENTER): Payer: BC Managed Care – PPO | Admitting: Gastroenterology

## 2012-07-08 VITALS — BP 133/71 | HR 76 | Temp 97.5°F | Resp 18 | Ht 63.0 in | Wt 217.0 lb

## 2012-07-08 DIAGNOSIS — D126 Benign neoplasm of colon, unspecified: Secondary | ICD-10-CM

## 2012-07-08 DIAGNOSIS — Z1211 Encounter for screening for malignant neoplasm of colon: Secondary | ICD-10-CM

## 2012-07-08 DIAGNOSIS — K635 Polyp of colon: Secondary | ICD-10-CM

## 2012-07-08 MED ORDER — SODIUM CHLORIDE 0.9 % IV SOLN: 500.0000 mL | INTRAVENOUS | Status: DC

## 2012-07-08 NOTE — Patient Instructions (Addendum)

## 2012-07-08 NOTE — Op Note (Signed)
[facility] [address] [place], [place]  [address]  COLONOSCOPY PROCEDURE REPORT  PATIENT:  [name], [name]  MR#:  [id number] BIRTHDATE:  1961-07-04, 51 yrs. old  GENDER:  female ENDOSCOPIST:  Barbette Hair. Arlyce Dice, MD REF. BY:  [name], M.D. PROCEDURE DATE:  07/08/2012 PROCEDURE:  Colonoscopy with snare polypectomy ASA CLASS:  Class II INDICATIONS:  Routine Risk Screening MEDICATIONS:   MAC sedation, administered by CRNA propofol 320mg IV  DESCRIPTION OF PROCEDURE:   After the risks benefits and alternatives of the procedure were thoroughly explained, informed consent was obtained.  Digital rectal exam was performed and revealed no abnormalities.   The LB [id number] endoscope was introduced through the anus and advanced to the cecum, which was identified by both the appendix and ileocecal valve, without limitations.  The quality of the prep was good, using MoviPrep. The instrument was then slowly withdrawn as the colon was fully examined. <<PROCEDUREIMAGES>>  FINDINGS:  A sessile polyp was found in the sigmoid colon. It was 3 mm in size. It was found 20 cm from the point of entry. Polyp was snared without cautery. Retrieval was successful (see image4). snare polyp  This was otherwise a normal examination of the colon (see image2 and image5).   Retroflexed views in the rectum revealed no abnormalities.    The time to cecum =  1) 3.50 minutes. The scope was then withdrawn in  1) 11.0  minutes from the cecum and the procedure completed. COMPLICATIONS:  None ENDOSCOPIC IMPRESSION: 1) 3 mm sessile polyp in the sigmoid colon 2) Otherwise normal examination RECOMMENDATIONS: 1) If the polyp(s) removed today are proven to be adenomatous (pre-cancerous) polyps, you will need a repeat colonoscopy in 5 years. Otherwise you should continue to follow colorectal cancer screening guidelines for "routine risk" patients with colonoscopy in 10 years. You will receive a  letter within 1-2 weeks with the results of your biopsy as well as final recommendations. Please call my office if you have not received a letter after 3 weeks. REPEAT EXAM:  You will receive a letter from Dr. Arlyce Dice in 1-2 weeks, after reviewing the final pathology, with followup recommendations.  ______________________________ Barbette Hair Arlyce Dice, MD  CC:  n. eSIGNED:   Barbette Hair. Kaplan at 07/08/2012 04:23 PM  Elayne Guerin, [id number]

## 2012-07-08 NOTE — Progress Notes (Signed)
Patient did not experience any of the following events: a burn prior to discharge; a fall within the facility; wrong site/side/patient/procedure/implant event; or a hospital transfer or hospital admission upon discharge from the facility. (G8907) Patient did not have preoperative order for IV antibiotic SSI prophylaxis. (G8918)  

## 2012-07-11 ENCOUNTER — Telehealth: Payer: Self-pay | Admitting: *Deleted

## 2012-07-11 NOTE — Telephone Encounter (Signed)
NO ANSWER, MESSAGE LEFT FOR THE PATENT.

## 2012-07-18 ENCOUNTER — Encounter: Payer: Self-pay | Admitting: Gastroenterology

## 2012-08-08 ENCOUNTER — Telehealth: Payer: Self-pay | Admitting: Family Medicine

## 2012-08-08 MED ORDER — FLUCONAZOLE 150 MG PO TABS: ORAL_TABLET | ORAL | Status: AC

## 2012-08-08 NOTE — Telephone Encounter (Signed)
Ok for Diflucan 150mg x1 dose 

## 2012-08-08 NOTE — Telephone Encounter (Signed)
Caller: Jennavie/Patient; Patient Name: [name]; PCP: Sheliah Hatch.; Best Callback Phone Number: [phone] [name] beach [place] and in [place] a lot.  States she developed vaginal irritation on 08/01/12.  White vaginal discharge and genital itching.  Denies lower abdominal pain.  Afebrile.  Denies pregnancy.  LMP: Hysterectomy.  Utilized Vaginal Discharge and Irritation Guideline.  See Provider within 24 hrs disposition due to genital itching.  Patient is requesting something for yeast to be [facility] [place] [phone].  Declines appointment.  PLEASE FOLLOW UP WITH [name] REGARDING MEDICATION REQUEST.

## 2012-08-08 NOTE — Telephone Encounter (Signed)
rx sent to pharmacy by e-script to pharmacy per pt, she is aware to pick up medication this afternoon

## 2012-09-02 ENCOUNTER — Telehealth: Payer: Self-pay | Admitting: *Deleted

## 2012-09-02 NOTE — Telephone Encounter (Signed)
Called pt to advise the form she left up front has been signed and faxed to the [facility], pt confirmed address and will mail copy on Monday per mail has left out for the day, pt understood

## 2012-09-09 ENCOUNTER — Other Ambulatory Visit: Payer: Self-pay | Admitting: Family Medicine

## 2012-09-09 MED ORDER — ZOLPIDEM TARTRATE 10 MG PO TABS: 10.0000 mg | ORAL_TABLET | Freq: Every evening | ORAL | Status: DC | PRN

## 2012-09-09 NOTE — Telephone Encounter (Signed)
Zolpidem Tartrate (Tab) 10 MG Take 1 tablet (10 mg total) by mouth at bedtime as needed for sleep.---last fill 8.23.13 Last OV V70 5.6.13

## 2012-09-09 NOTE — Telephone Encounter (Signed)
.  rx faxed to pharmacy, manually. Called pt to advise the the RX has been faxed

## 2012-09-09 NOTE — Telephone Encounter (Signed)
Ok for #30, 3 refills 

## 2012-09-09 NOTE — Telephone Encounter (Signed)
Pt left vm on triage line asking if she needed an OV to get this filled. She would like someone to call her back and let her know. Call back # [phone].

## 2012-09-09 NOTE — Telephone Encounter (Signed)
Last OV 05-02-12 last refill 05-19-12 #30 with 3 refills

## 2012-10-12 ENCOUNTER — Encounter: Payer: Self-pay | Admitting: *Deleted

## 2012-10-12 ENCOUNTER — Telehealth: Payer: Self-pay | Admitting: Oncology

## 2012-10-12 ENCOUNTER — Ambulatory Visit (HOSPITAL_BASED_OUTPATIENT_CLINIC_OR_DEPARTMENT_OTHER): Payer: BC Managed Care – PPO | Admitting: Oncology

## 2012-10-12 ENCOUNTER — Encounter: Payer: Self-pay | Admitting: Oncology

## 2012-10-12 VITALS — BP 123/81 | HR 85 | Temp 98.1°F | Resp 20 | Ht 63.0 in | Wt 221.3 lb

## 2012-10-12 DIAGNOSIS — Z170 Estrogen receptor positive status [ER+]: Secondary | ICD-10-CM

## 2012-10-12 DIAGNOSIS — C50119 Malignant neoplasm of central portion of unspecified female breast: Secondary | ICD-10-CM

## 2012-10-12 DIAGNOSIS — C50919 Malignant neoplasm of unspecified site of unspecified female breast: Secondary | ICD-10-CM

## 2012-10-12 MED ORDER — GABAPENTIN 300 MG PO CAPS: 300.0000 mg | ORAL_CAPSULE | Freq: Three times a day (TID) | ORAL | Status: DC

## 2012-10-12 MED ORDER — VENLAFAXINE HCL ER 75 MG PO CP24: 75.0000 mg | ORAL_CAPSULE | Freq: Every day | ORAL | Status: DC

## 2012-10-12 MED ORDER — TAMOXIFEN CITRATE 20 MG PO TABS: 20.0000 mg | ORAL_TABLET | Freq: Every day | ORAL | Status: DC

## 2012-10-12 NOTE — Progress Notes (Signed)
OFFICE PROGRESS NOTE  CC [name] M.D. [name] M.D.  [name], MD [phone] [place] [address]  DIAGNOSIS: 51 year old female with stage II invasive lobular carcinoma of the left breast diagnosed December 2011.  PRIOR THERAPY:  #1 patient is status post lumpectomy with sentinel node biopsy. The final pathology revealed a 1.2 cm invasive lobular carcinoma. One sentinel node was positive for micrometastatic disease. The tumor was grade 3 there was estrogen receptor +100% progesterone receptor +100% proliferation marker 12% and HER-2/neu negative.  #2 the patient underwent Oncotype DX studies on her tissue that showed a recurrence score of 22 with an average risk of 14% with 5 years of tamoxifen.  #3 she then received adjuvant chemotherapy initially consisting of Adriamycin Cytoxan administered from 03/17/2011 to 04/28/2011. She then went on to receive single agent Taxol from 05/12/2011 2 06/30/2011 for a total of 8 weeks.  #4 the patient received radiation therapy from 08/28/2011 and completed on 10/13/2011.  #5 patient is now on adjuvant tamoxifen 20 mg daily starting November 2012. A total of 5-10 years of therapy is planned.  CURRENT THERAPY: Patient to begin tamoxifen 20 mg daily  INTERVAL HISTORY: [name] 51 y.o. female returns for followup visit. Overall she is doing well. She was last seen about 6 months ago by my nurse practitioner. Since then patient has had no real issues. She denies any fevers chills night sweats headaches shortness of breath chest pains palpitations she does have hot flashes secondary to the tamoxifen. She has no vaginal bleeding no swelling in her lower extremities. No easy bruising. She has noticed some breast tenderness but this is significantly improved she has no skin rashes. Remainder of the 10 point review of systems is negative. MEDICAL HISTORY: Past Medical History  Diagnosis Date  . Metabolic syndrome   .  Hypertension   . Hyperlipidemia   . Depression   . Obesity   . Adenocarcinoma, breast   . Vitamin D deficiency   . Anxiety   . Blood transfusion 2004    ALLERGIES:   has no known allergies.  MEDICATIONS:  Current Outpatient Prescriptions  Medication Sig Dispense Refill  . atenolol-chlorthalidone (TENORETIC) 50-25 MG per tablet Take 0.5 tablets by mouth daily.  30 tablet  5  . simvastatin (ZOCOR) 40 MG tablet Take 1 tablet (40 mg total) by mouth at bedtime.  90 tablet  1  . tamoxifen (NOLVADEX) 20 MG tablet Take 1 tablet (20 mg total) by mouth daily.  90 tablet  3  . venlafaxine (EFFEXOR-XR) 37.5 MG 24 hr capsule Take one tab by mouth daily for 1 week, then increase to 2 tabs by mouth daily.  60 capsule  5  . zolpidem (AMBIEN) 10 MG tablet Take 1 tablet (10 mg total) by mouth at bedtime as needed for sleep.  30 tablet  3  . furosemide (LASIX) 40 MG tablet Take 40 mg by mouth daily as needed.      . gabapentin (NEURONTIN) 300 MG capsule Take 300 mg by mouth 3 (three) times daily as needed.        SURGICAL HISTORY:  Past Surgical History  Procedure Date  . Total abdominal hysterectomy 2005  . Btl   . Tonsillectomy   . Carpal tunnel release   . Port-a-cath removal 02/19/2012    Procedure: MINOR REMOVAL PORT-A-CATH;  Surgeon: Mariella Saa, MD;  Location: [facility];  Service: General;  Laterality: Right;  . Port-a-cath removal 02/19/2012  Procedure: MINOR REMOVAL PORT-A-CATH;  Surgeon: Mariella Saa, MD;  Location: [facility];  Service: General;  Laterality: Right;    REVIEW OF SYSTEMS:  Eyes: negative Ears, nose, mouth, throat, and face: negative Respiratory: negative Cardiovascular: negative Gastrointestinal: negative Genitourinary:negative Integument/breast: negative except for breast tenderness, skin color change and And surgical changes in the left breast. The right breast no masses or nipple discharge. Hematologic/lymphatic:  negative Musculoskeletal:negative Neurological: negative   PHYSICAL EXAMINATION: General appearance: alert, cooperative, appears stated age, no distress and mildly obese Neck: no adenopathy, no carotid bruit, no JVD, supple, symmetrical, trachea midline and thyroid not enlarged, symmetric, no tenderness/mass/nodules Lymph nodes: Cervical, supraclavicular, and axillary nodes normal. Resp: clear to auscultation bilaterally and normal percussion bilaterally Back: symmetric, no curvature. ROM normal. No CVA tenderness. Cardio: regular rate and rhythm, S1, S2 normal, no murmur, click, rub or gallop and normal apical impulse GI: soft, non-tender; bowel sounds normal; no masses,  no organomegaly Extremities: extremities normal, atraumatic, no cyanosis or edema Neurologic: Alert and oriented X 3, normal strength and tone. Normal symmetric reflexes. Normal coordination and gait Mental status: Alert, oriented, thought content appropriate Sensory: normal Motor: grossly normal Reflexes: 2+ and symmetric Bilateral breasts are examined. Left breast reveals a well-healed surgical scar in the upper outer quadrant. There is no nipple retraction no masses no nipple notes other skin changes other than darkening of the skin secondary to radiation therapy. Right breast no masses nipple discharge or retraction  ECOG PERFORMANCE STATUS: 0 - Asymptomatic  Blood pressure 123/81, pulse 85, temperature 98.1 F (36.7 C), temperature source Oral, resp. rate 20, height 5\' 3"  (1.6 m), weight 221 lb 4.8 oz (100.381 kg).  LABORATORY DATA: Lab Results  Component Value Date   WBC 4.0 06/03/2012   HGB 12.7 06/03/2012   HCT 36.7 06/03/2012   MCV 90.2 06/03/2012   PLT 210 06/03/2012      Chemistry      Component Value Date/Time   NA 141 05/02/2012 1110   K 3.8 05/02/2012 1110   CL 103 05/02/2012 1110   CO2 28 05/02/2012 1110   BUN 15 05/02/2012 1110   CREATININE 0.8 05/02/2012 1110      Component Value Date/Time   CALCIUM 9.5  05/02/2012 1110   ALKPHOS 73 05/02/2012 1110   AST 17 05/02/2012 1110   ALT 16 05/02/2012 1110   BILITOT 0.7 05/02/2012 1110       RADIOGRAPHIC STUDIES:  No results found.  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  #2 she is status post lumpectomy followed by adjuvant chemotherapy. Chemotherapy consisted of 4 cycles of dose dense Adriamycin and Cytoxan followed by a weeks of single agent Taxol. 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 She therefore only received 8 weeks of Taxol. #3 patient is status post radiation therapy.   #4 patient was then begun on tamoxifen 20 mg daily. Thus far she is tolerating it well.  PLAN:  #1 patient will continue tamoxifen for a total of 5-10 years.  #2 she will be seen back in 6 months time in followup.   All questions were answered. The patient knows to call the clinic with any problems, questions or concerns. We can certainly see the patient much sooner if necessary.  I spent 25 minutes counseling the patient face to face. The total time spent in the appointment was 30 minutes.    Ellwyn Ergle  Welton Flakes, MD Medical/Oncology [facility] [phone] (beeper) [phone] (Office)  10/12/2012, 2:42 PM

## 2012-10-12 NOTE — Patient Instructions (Addendum)
Continue tamoxifen 20 mg daily  Take effexor 75 mg daily  Neurontin 300 mg TID  Vitamin D3 2000 iu daily A good vitamin B complex (B6, B12, B3)  I will see you back in 6 months

## 2012-10-12 NOTE — Telephone Encounter (Signed)
gve the pt her may 2014 appt calendar 

## 2012-10-12 NOTE — Progress Notes (Signed)
Patient in to clinic today for Month 12 visit. Questionnaires completed by patient, and RTOG SOMA evaluation completed by Dr. Welton Flakes. Photographs were obtained per protocol. Patient reports that she is not using any products on her skin. She continues to take tamoxifen daily. Patient is aware that this is her final study visit scheduled and there are presently no plans to the extend the study to 18 months, however she will be notified of any future changes that may occur in the study. Thanked patient for her participation.

## 2012-10-20 ENCOUNTER — Ambulatory Visit: Payer: BC Managed Care – PPO | Admitting: Radiation Oncology

## 2012-11-09 IMAGING — CR DG CHEST 1V PORT
1 series · 1 of 1 positions shown · non-contrast
Comparison: None.

CLINICAL DATA: Port-A-Cath insertion

PORTABLE CHEST - 1 VIEW

[view not recorded]
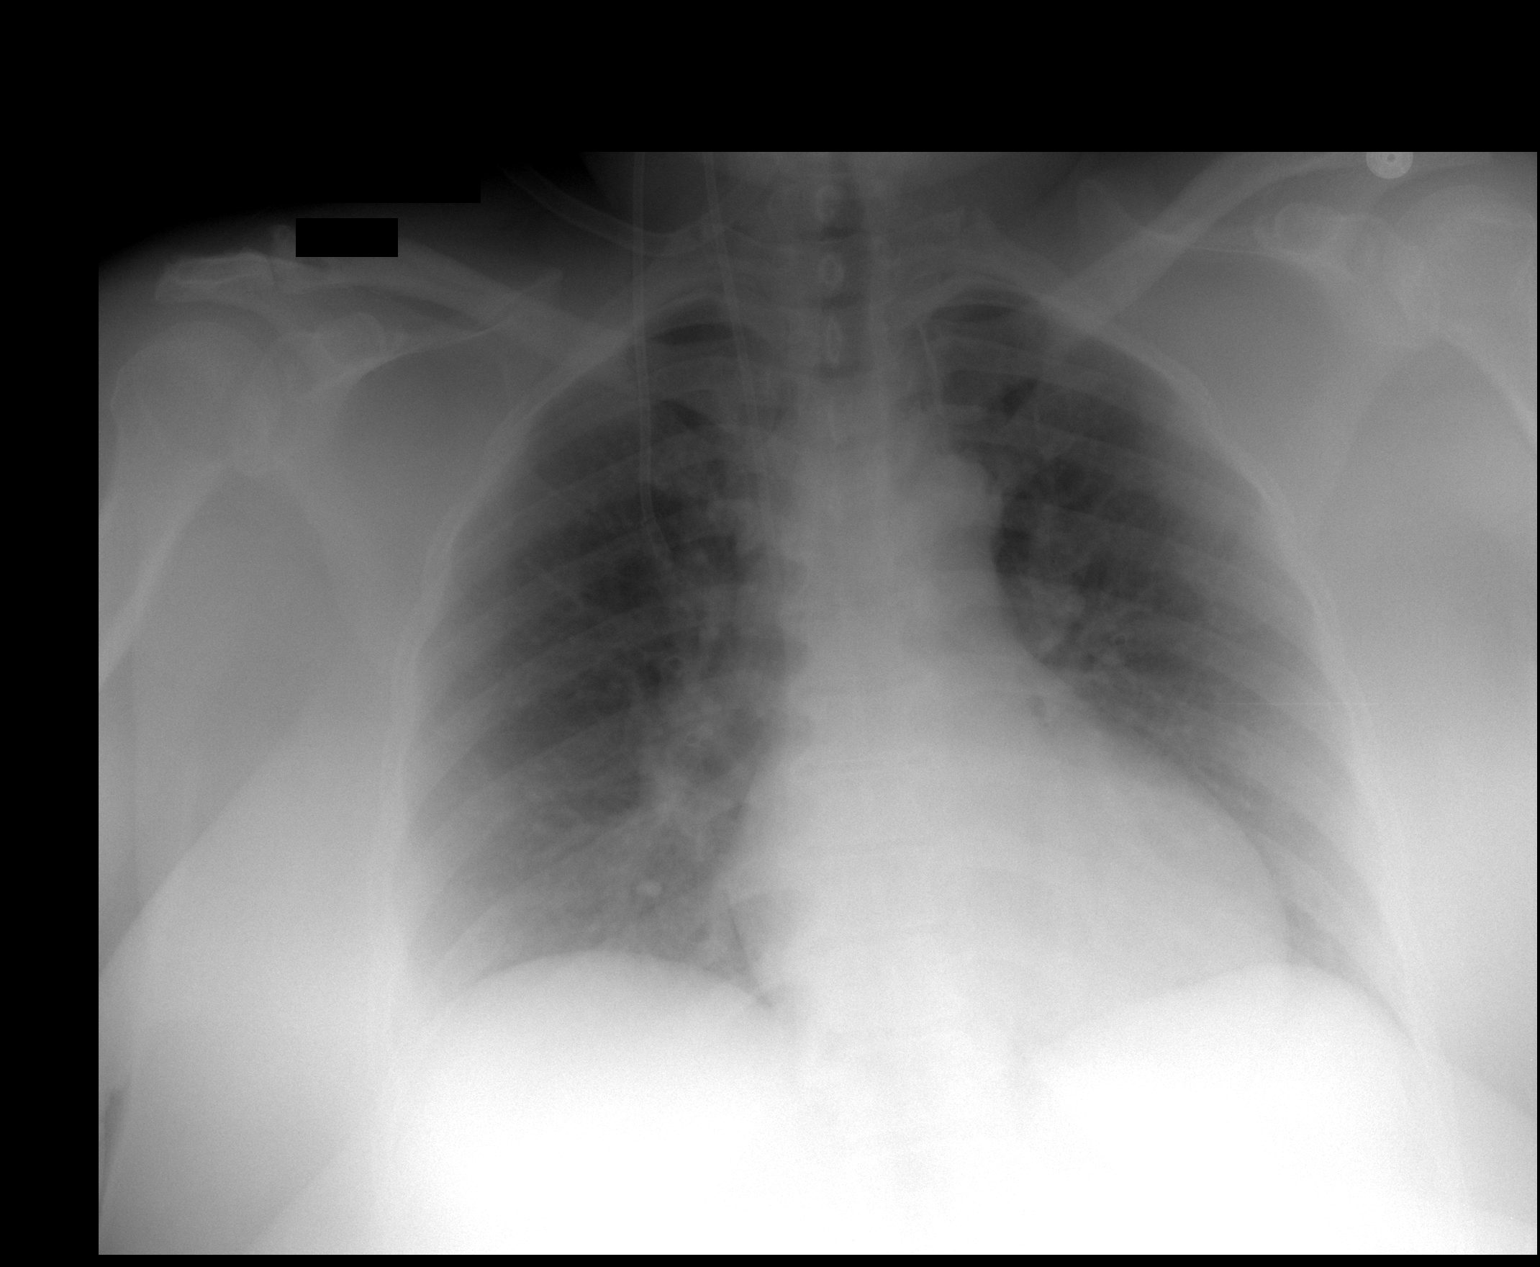

[1 of 1 positions shown; findings below may reference images not displayed]

FINDINGS: A Port-A-Cath has been placed from the right jugular
venous approach.  The tip of the catheter is difficult to precisely
locate.  The film is slightly under penetrated.  I believe the tip
is in the upper right atrium.

No pneumothorax.  Lungs clear.

Consider PA and lateral chest x-ray f to better visualize the tip
of the catheter.
IMPRESSION: 1.  Right IJ Port-A-Cath placement with tip probably in the upper
right atrium. See report.

2.  No pneumothorax or active disease.

## 2012-11-15 IMAGING — CR DG CHEST 2V
2 series · 2 of 2 positions shown · non-contrast
Comparison: 03/17/2011

***ADDENDUM*** CREATED: 03/17/2011 [DATE]

The patient presented to the [HOSPITAL] [HOSPITAL] for
verification of Port-A-Cath placement, with fluoroscopy.  On review
of the chest x-ray performed today, 03/17/2011, the distal tip of
the catheter is shown to be in the upper superior vena cava, in
satisfactory position. Therefore, no additional procedure was
necessary. Findings were discussed with the patient in person, and
with Dr. Dearon nurse by telephone.
***END ADDENDUM*** SIGNED BY: Un Elmer, M.D.
CLINICAL DATA: Evaluation of Port-A-Cath placement prior to
chemotherapy.  History of breast carcinoma.
CHEST - 2 VIEW

[w chest pa]
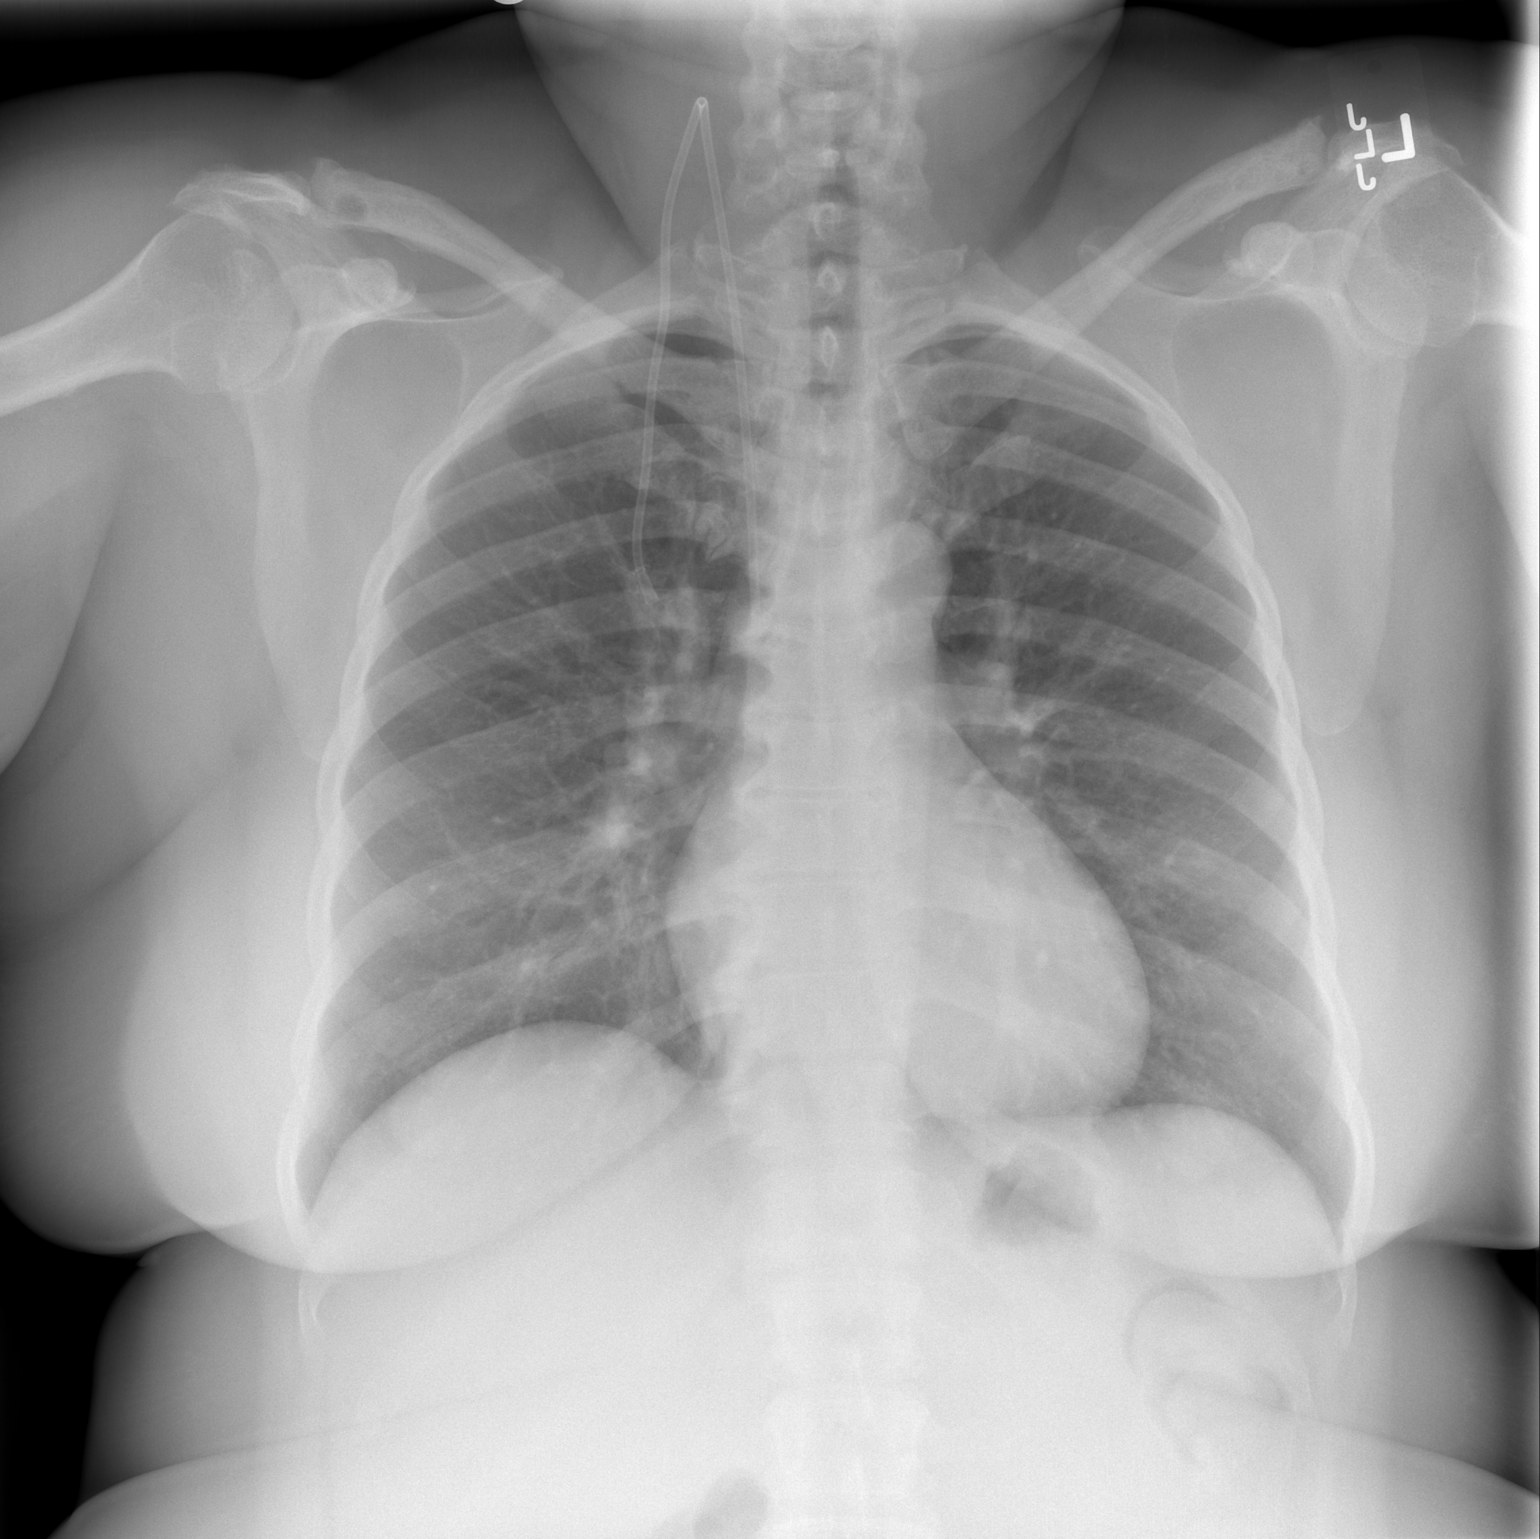

[w chest lat]
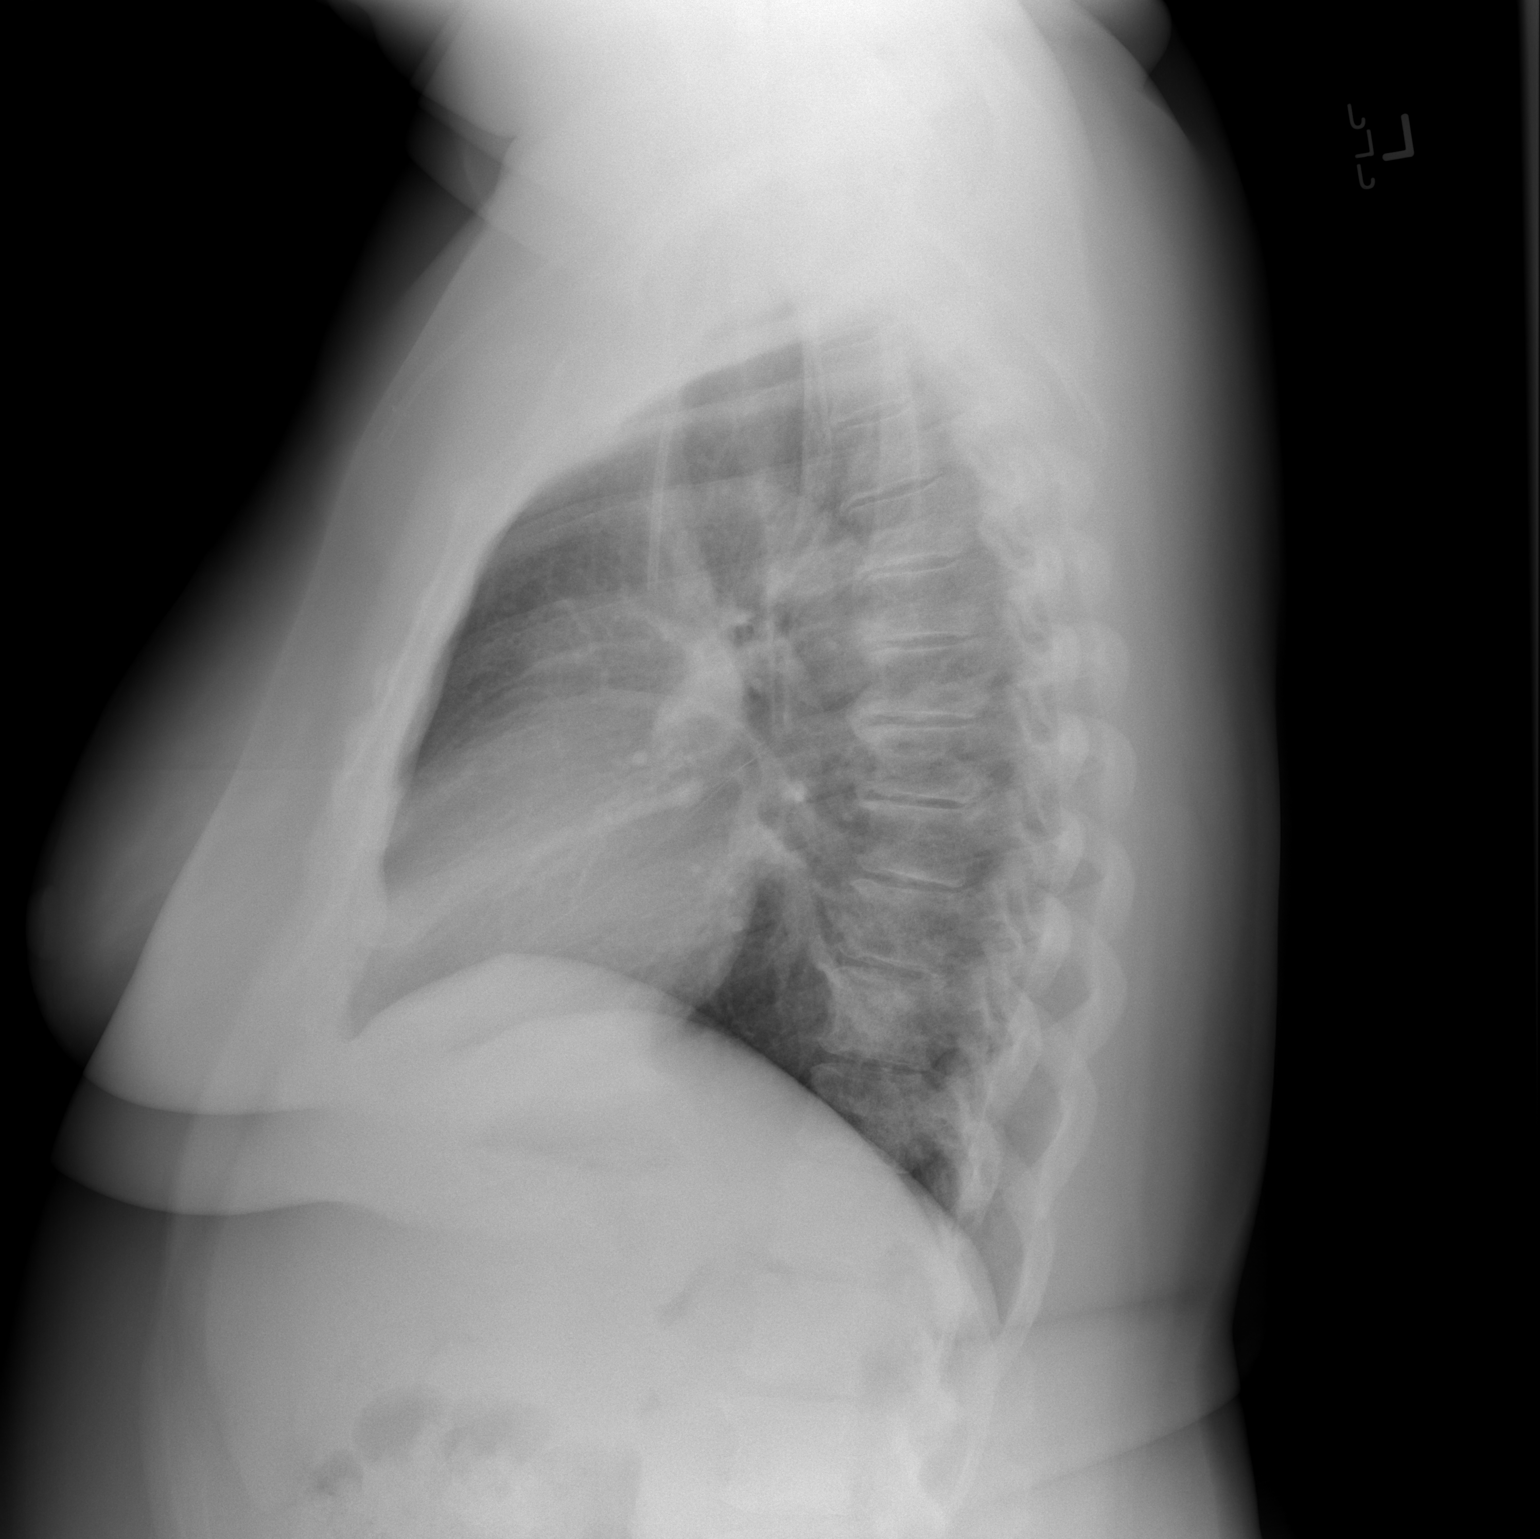

[2 of 2 positions shown; findings below may reference images not displayed]

FINDINGS: Right internal jugular Port-A-Cath is in place.  The tip
of the catheter is again difficult to precisely visualize but
appears to be in the cavoatrial junction region.  No pneumothorax
is evident. The lungs are well aerated and free of infiltrates. The
cardiac silhouette is normal size and shape. No pleural abnormality
is evident.  Osteophytes are present in the spine.
IMPRESSION: Right internal jugular Port-A-Cath is in place.  The tip of the
catheter is again difficult to precisely visualize but appears to
be in the cavoatrial junction region.

## 2012-12-04 IMAGING — CR DG CHEST 2V
2 series · 2 of 2 positions shown · non-contrast
Comparison: 03/17/2011.

CLINICAL DATA: Fever.  Cough.

CHEST - 2 VIEW

[w chest pa]
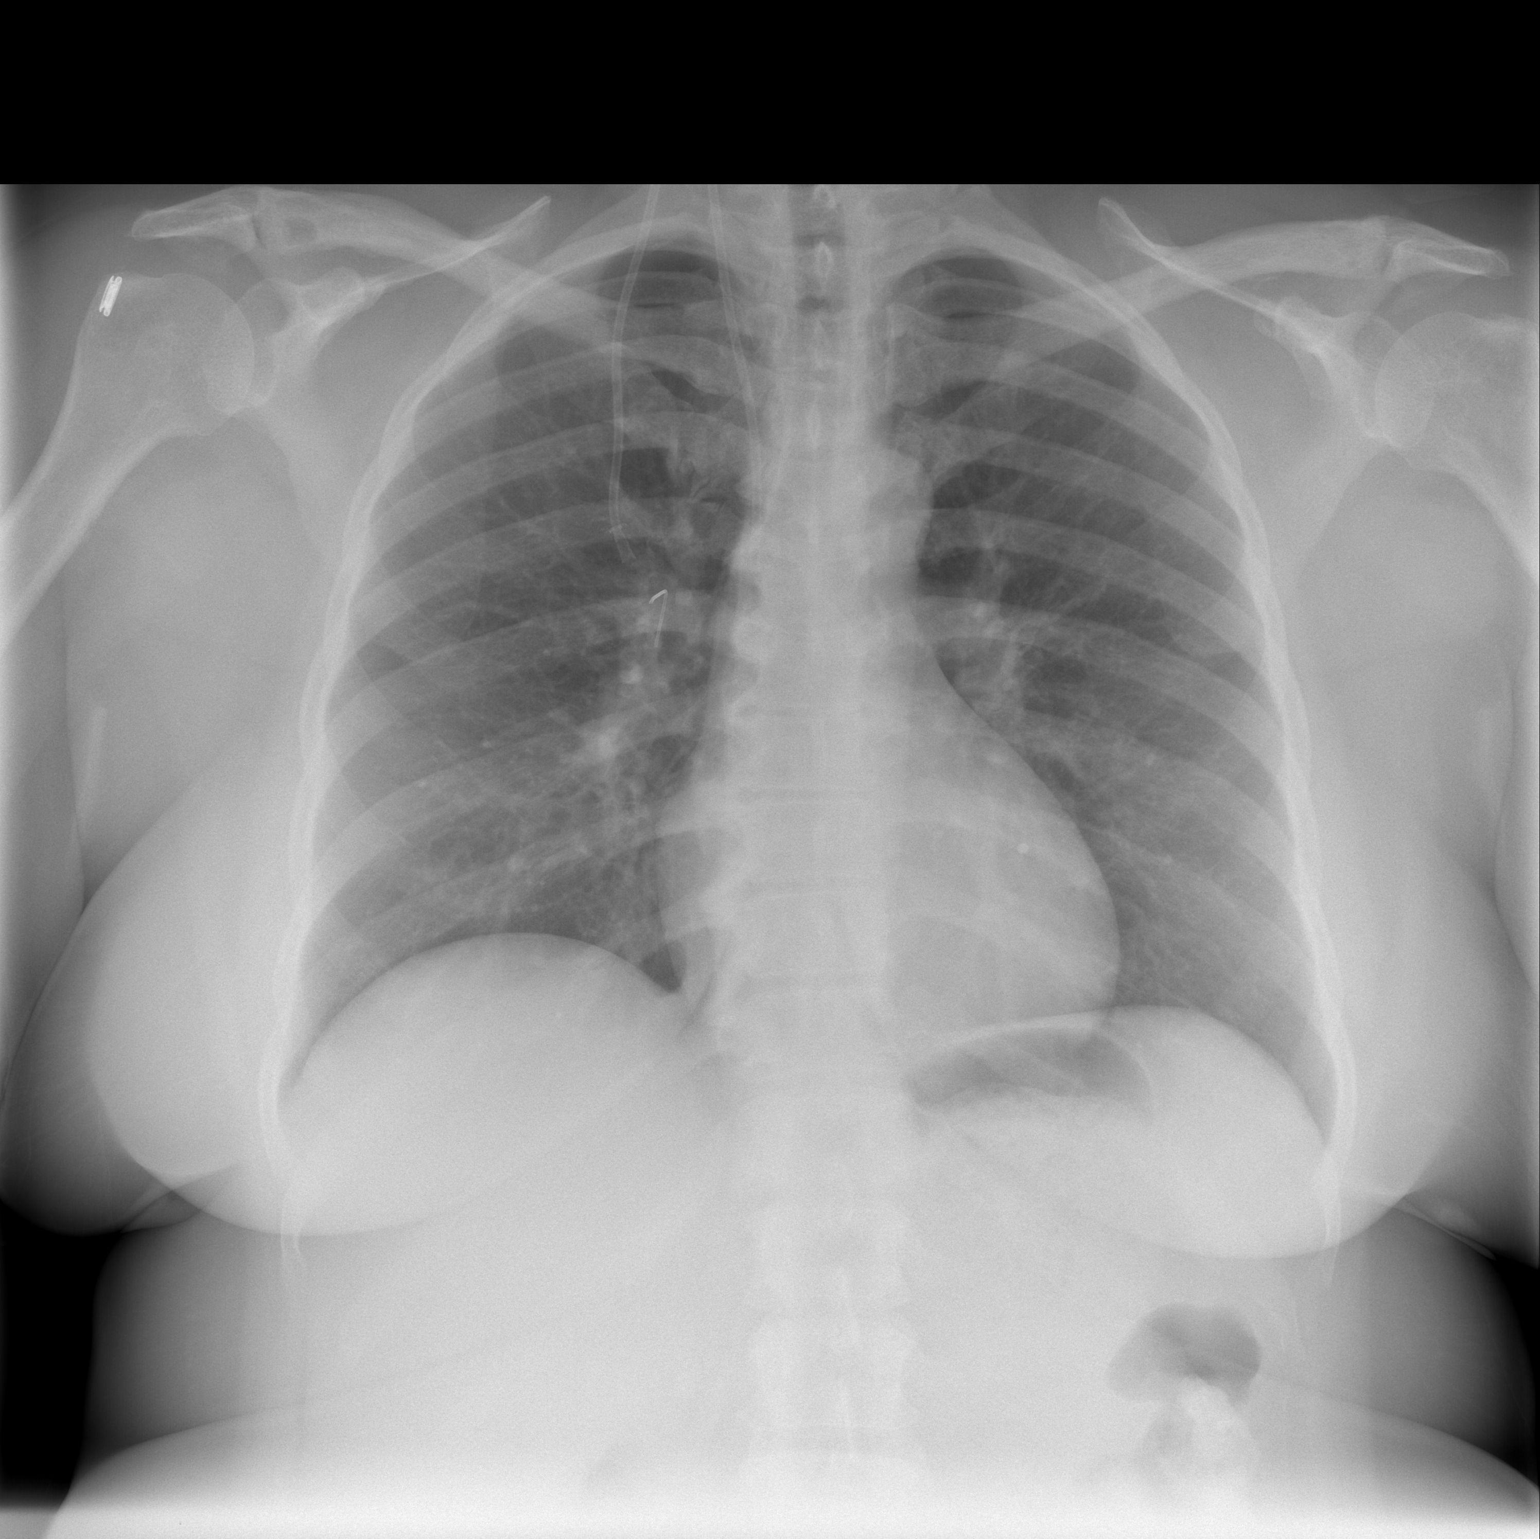

[w chest lat]
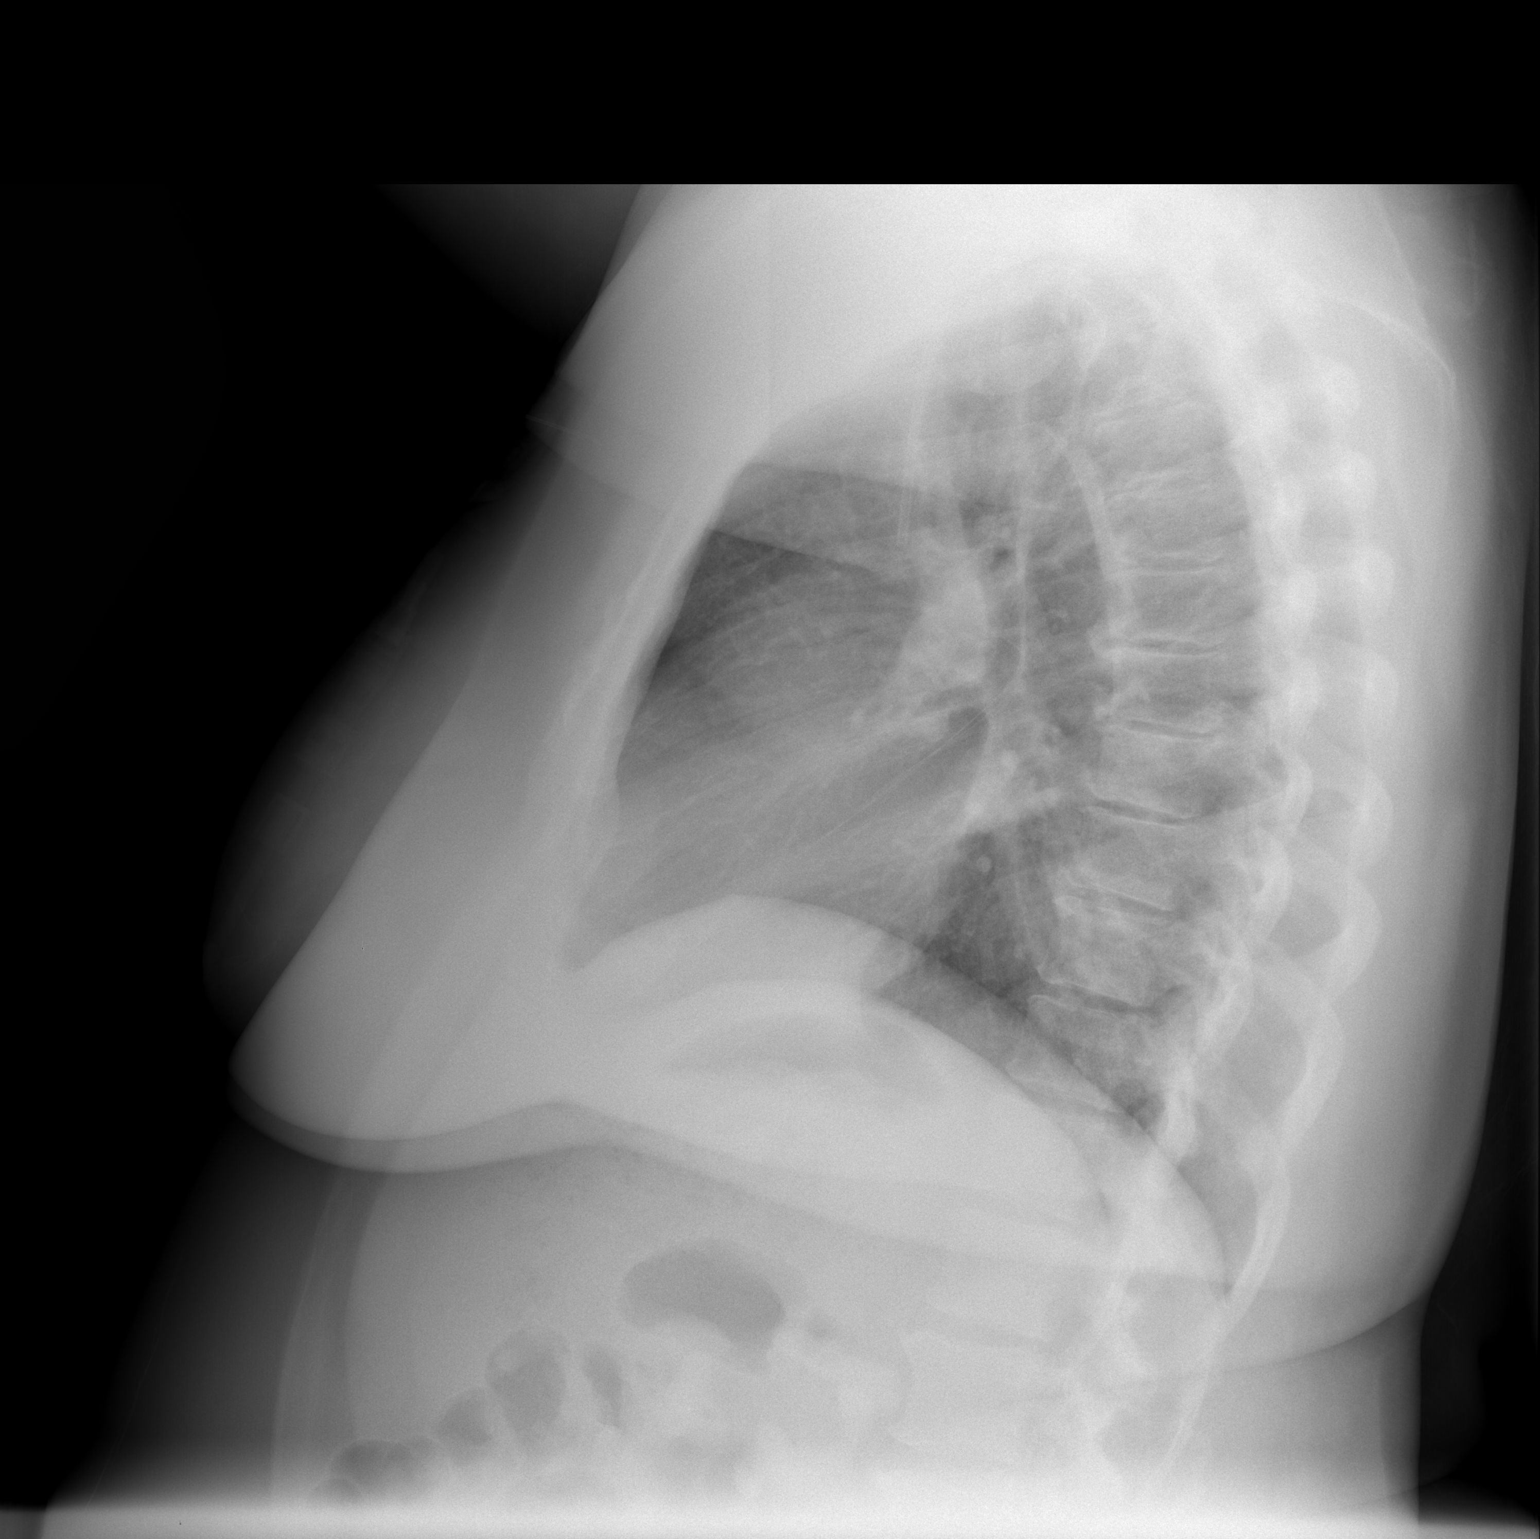

[2 of 2 positions shown; findings below may reference images not displayed]

FINDINGS: Right IJ Port-A-Cath is present, unchanged from prior.
Cardiopericardial silhouette and mediastinal contours are unchanged
as well.  The small cyst is present in the distal right clavicle.
The lungs appear clear.  There is no effusion.  No airspace
disease.  Mild mid thoracic spondylosis.
IMPRESSION: No active cardiopulmonary disease.  Unchanged right IJ Port-A-Cath.

## 2012-12-16 ENCOUNTER — Other Ambulatory Visit: Payer: Self-pay | Admitting: Oncology

## 2012-12-16 DIAGNOSIS — Z9889 Other specified postprocedural states: Secondary | ICD-10-CM

## 2012-12-16 DIAGNOSIS — Z853 Personal history of malignant neoplasm of breast: Secondary | ICD-10-CM

## 2013-01-03 ENCOUNTER — Ambulatory Visit
Admission: RE | Admit: 2013-01-03 | Discharge: 2013-01-03 | Disposition: A | Discharge status: Home / Self Care | Payer: BC Managed Care – PPO | Source: Ambulatory Visit | Attending: Oncology | Admitting: Oncology

## 2013-01-03 DIAGNOSIS — Z9889 Other specified postprocedural states: Secondary | ICD-10-CM

## 2013-01-03 DIAGNOSIS — Z853 Personal history of malignant neoplasm of breast: Secondary | ICD-10-CM

## 2013-01-06 ENCOUNTER — Other Ambulatory Visit: Payer: Self-pay | Admitting: *Deleted

## 2013-01-06 NOTE — Telephone Encounter (Signed)
Last OV 05-02-12, last filled 09-09-12 #30 3. Pt is requesting a refill and increase in mg if possible.Please advise

## 2013-01-08 NOTE — Telephone Encounter (Signed)
Ok for #30, 3 refills.  needs to sign agreement and do UDS if not already done.  Unfortunately 10mg  is the highest dose so there's no room to increase

## 2013-01-09 NOTE — Telephone Encounter (Signed)
LM @ (5:31pm) asking the pt to RTC.//AB/CMA

## 2013-01-10 MED ORDER — ZOLPIDEM TARTRATE 10 MG PO TABS: 10.0000 mg | ORAL_TABLET | Freq: Every evening | ORAL | Status: DC | PRN

## 2013-01-10 NOTE — Telephone Encounter (Signed)
Spoke with the pt and informed her the RF request is ready and she will need to pick it up.  She will also need to sign an agreement.  Also informed the pt that the dose will not be able to be increased because 10mg  is the highest dose.  Pt understood and agreed.//AB/CMA

## 2013-01-12 ENCOUNTER — Other Ambulatory Visit: Payer: Self-pay | Admitting: Oncology

## 2013-02-23 ENCOUNTER — Encounter: Payer: Self-pay | Admitting: Family Medicine

## 2013-04-10 ENCOUNTER — Other Ambulatory Visit: Payer: Self-pay | Admitting: Family Medicine

## 2013-04-10 DIAGNOSIS — I10 Essential (primary) hypertension: Secondary | ICD-10-CM

## 2013-04-10 NOTE — Telephone Encounter (Signed)
[facility] [address]

## 2013-05-01 ENCOUNTER — Telehealth: Payer: Self-pay | Admitting: Oncology

## 2013-05-01 ENCOUNTER — Other Ambulatory Visit (HOSPITAL_BASED_OUTPATIENT_CLINIC_OR_DEPARTMENT_OTHER): Payer: BC Managed Care – PPO | Admitting: Lab

## 2013-05-01 ENCOUNTER — Ambulatory Visit (HOSPITAL_BASED_OUTPATIENT_CLINIC_OR_DEPARTMENT_OTHER): Payer: BC Managed Care – PPO | Admitting: Oncology

## 2013-05-01 ENCOUNTER — Ambulatory Visit: Payer: BC Managed Care – PPO | Admitting: Oncology

## 2013-05-01 VITALS — BP 123/79 | HR 66 | Temp 98.3°F | Resp 20 | Ht 63.0 in | Wt 229.5 lb

## 2013-05-01 DIAGNOSIS — C50912 Malignant neoplasm of unspecified site of left female breast: Secondary | ICD-10-CM

## 2013-05-01 DIAGNOSIS — C773 Secondary and unspecified malignant neoplasm of axilla and upper limb lymph nodes: Secondary | ICD-10-CM

## 2013-05-01 DIAGNOSIS — G622 Polyneuropathy due to other toxic agents: Secondary | ICD-10-CM

## 2013-05-01 DIAGNOSIS — C50119 Malignant neoplasm of central portion of unspecified female breast: Secondary | ICD-10-CM

## 2013-05-01 DIAGNOSIS — C50919 Malignant neoplasm of unspecified site of unspecified female breast: Secondary | ICD-10-CM

## 2013-05-01 LAB — COMPREHENSIVE METABOLIC PANEL (CC13)
ALT: 15 U/L (ref 0–55)
CO2: 29 mEq/L (ref 22–29)
Calcium: 9.3 mg/dL (ref 8.4–10.4)
Chloride: 104 mEq/L (ref 98–107)
Potassium: 3.8 mEq/L (ref 3.5–5.1)
Sodium: 142 mEq/L (ref 136–145)
Total Protein: 7.4 g/dL (ref 6.4–8.3)

## 2013-05-01 LAB — CBC WITH DIFFERENTIAL/PLATELET
BASO%: 0.6 % (ref 0.0–2.0)
HCT: 36.2 % (ref 34.8–46.6)
MCHC: 33.8 g/dL (ref 31.5–36.0)
MONO#: 0.5 10*3/uL (ref 0.1–0.9)
RBC: 3.94 10*6/uL (ref 3.70–5.45)
RDW: 12.7 % (ref 11.2–14.5)
WBC: 6.1 10*3/uL (ref 3.9–10.3)
lymph#: 2.8 10*3/uL (ref 0.9–3.3)

## 2013-05-01 MED ORDER — GABAPENTIN 300 MG PO CAPS: 300.0000 mg | ORAL_CAPSULE | Freq: Four times a day (QID) | ORAL | Status: DC

## 2013-05-01 MED ORDER — VENLAFAXINE HCL ER 75 MG PO CP24: 75.0000 mg | ORAL_CAPSULE | Freq: Every day | ORAL | Status: DC

## 2013-05-01 NOTE — Progress Notes (Signed)
OFFICE PROGRESS NOTE  CC [name] M.D. [name] M.D.  [name], MD [phone] [place] [address]  DIAGNOSIS: 52 year old female with stage II invasive lobular carcinoma of the left breast diagnosed December 2011.  PRIOR THERAPY:  #1 patient is status post lumpectomy with sentinel node biopsy. The final pathology revealed a 1.2 cm invasive lobular carcinoma. One sentinel node was positive for micrometastatic disease. The tumor was grade 3 there was estrogen receptor +100% progesterone receptor +100% proliferation marker 12% and HER-2/neu negative.  #2 the patient underwent Oncotype DX studies on her tissue that showed a recurrence score of 22 with an average risk of 14% with 5 years of tamoxifen.  #3 she then received adjuvant chemotherapy initially consisting of Adriamycin Cytoxan administered from 03/17/2011 to 04/28/2011. She then went on to receive single agent Taxol from 05/12/2011 2 06/30/2011 for a total of 8 weeks.  #4 the patient received radiation therapy from 08/28/2011 and completed on 10/13/2011.  #5 patient is now on adjuvant tamoxifen 20 mg daily starting November 2012. A total of 5-10 years of therapy is planned.  CURRENT THERAPY: Patient to begin tamoxifen 20 mg daily  INTERVAL HISTORY: [name] 52 y.o. female returns for followup visit. Overall she is doing well. She was last seen about 6 months ago. She denies any fevers chills night sweats headaches shortness of breath chest pains palpitations she does have hot flashes secondary to the tamoxifen. She has no vaginal bleeding no swelling in her lower extremities. No easy bruising. She has noticed some breast tenderness but this is significantly improved she has no skin rashes. She is experiencing peripheral paresthesias and is on Neurontin. The current dose only takes the edge off. I have therefore recommended increasing the dose. Remainder of the 10 point review of systems is  negative. MEDICAL HISTORY: Past Medical History  Diagnosis Date  . Metabolic syndrome   . Hypertension   . Hyperlipidemia   . Depression   . Obesity   . Adenocarcinoma, breast   . Vitamin D deficiency   . Anxiety   . Blood transfusion 2004    ALLERGIES:  has No Known Allergies.  MEDICATIONS:  Current Outpatient Prescriptions  Medication Sig Dispense Refill  . atenolol-chlorthalidone (TENORETIC) 50-25 MG per tablet TAKE 1/2 TABLETS BY MOUTH DAILY.  30 tablet  3  . furosemide (LASIX) 40 MG tablet Take 40 mg by mouth daily as needed.      . venlafaxine XR (EFFEXOR-XR) 75 MG 24 hr capsule Take 1 capsule (75 mg total) by mouth daily.  90 capsule  12  . gabapentin (NEURONTIN) 300 MG capsule Take 1 capsule (300 mg total) by mouth 4 (four) times daily.  180 capsule  8  . simvastatin (ZOCOR) 40 MG tablet Take 1 tablet (40 mg total) by mouth at bedtime.  90 tablet  1  . tamoxifen (NOLVADEX) 20 MG tablet Take 1 tablet (20 mg total) by mouth daily.  90 tablet  12  . zolpidem (AMBIEN) 10 MG tablet Take 1 tablet (10 mg total) by mouth at bedtime as needed for sleep.  30 tablet  3   No current facility-administered medications for this visit.    SURGICAL HISTORY:  Past Surgical History  Procedure Laterality Date  . Total abdominal hysterectomy  2005  . Btl    . Tonsillectomy    . Carpal tunnel release    . Port-a-cath removal  02/19/2012    Procedure: MINOR REMOVAL PORT-A-CATH;  Surgeon:  Mariella Saa, MD;  Location: [facility];  Service: General;  Laterality: Right;  . Port-a-cath removal  02/19/2012    Procedure: MINOR REMOVAL PORT-A-CATH;  Surgeon: Mariella Saa, MD;  Location: [facility];  Service: General;  Laterality: Right;    REVIEW OF SYSTEMS:  Eyes: negative Ears, nose, mouth, throat, and face: negative Respiratory: negative Cardiovascular: negative Gastrointestinal: negative Genitourinary:negative Integument/breast: negative except  for breast tenderness, skin color change and And surgical changes in the left breast. The right breast no masses or nipple discharge. Hematologic/lymphatic: negative Musculoskeletal:negative Neurological: negative   PHYSICAL EXAMINATION: General appearance: alert, cooperative, appears stated age, no distress and mildly obese Neck: no adenopathy, no carotid bruit, no JVD, supple, symmetrical, trachea midline and thyroid not enlarged, symmetric, no tenderness/mass/nodules Lymph nodes: Cervical, supraclavicular, and axillary nodes normal. Resp: clear to auscultation bilaterally and normal percussion bilaterally Back: symmetric, no curvature. ROM normal. No CVA tenderness. Cardio: regular rate and rhythm, S1, S2 normal, no murmur, click, rub or gallop and normal apical impulse GI: soft, non-tender; bowel sounds normal; no masses,  no organomegaly Extremities: extremities normal, atraumatic, no cyanosis or edema Neurologic: Alert and oriented X 3, normal strength and tone. Normal symmetric reflexes. Normal coordination and gait Mental status: Alert, oriented, thought content appropriate Sensory: normal Motor: grossly normal Reflexes: 2+ and symmetric Bilateral breasts are examined. Left breast reveals a well-healed surgical scar in the upper outer quadrant. There is no nipple retraction no masses no nipple notes other skin changes other than darkening of the skin secondary to radiation therapy. Right breast no masses nipple discharge or retraction  ECOG PERFORMANCE STATUS: 0 - Asymptomatic  Blood pressure 123/79, pulse 66, temperature 98.3 F (36.8 C), temperature source Oral, resp. rate 20, height 5\' 3"  (1.6 m), weight 229 lb 8 oz (104.101 kg).  LABORATORY DATA: Lab Results  Component Value Date   WBC 6.1 05/01/2013   HGB 12.2 05/01/2013   HCT 36.2 05/01/2013   MCV 91.8 05/01/2013   PLT 251 05/01/2013      Chemistry      Component Value Date/Time   NA 141 05/02/2012 1110   K 3.8 05/02/2012 1110    CL 103 05/02/2012 1110   CO2 28 05/02/2012 1110   BUN 15 05/02/2012 1110   CREATININE 0.8 05/02/2012 1110      Component Value Date/Time   CALCIUM 9.5 05/02/2012 1110   ALKPHOS 73 05/02/2012 1110   AST 17 05/02/2012 1110   ALT 16 05/02/2012 1110   BILITOT 0.7 05/02/2012 1110       RADIOGRAPHIC STUDIES:  No results found.  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  #2 she is status post lumpectomy followed by adjuvant chemotherapy. Chemotherapy consisted of 4 cycles of dose dense Adriamycin and Cytoxan followed by a weeks of single agent Taxol. 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 She therefore only received 8 weeks of Taxol. #3 patient is status post radiation therapy.   #4 patient was then begun on tamoxifen 20 mg daily. Thus far she is tolerating it well.   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 I will increase this to 4 times a day.  #6 hot flashes being managed by Effexor 75 mg daily.  PLAN:  #1 patient will continue tamoxifen for a total of 10 years.  #2 she will be  seen back in 6 months time in followup.   All questions were answered. The patient knows to call the clinic with any problems, questions or concerns. We can certainly see the patient much sooner if necessary.  I spent 25 minutes counseling the patient face to face. The total time spent in the appointment was 30 minutes.    Drue Second, MD Medical/Oncology [facility] [phone] (beeper) [phone] (Office)  05/01/2013, 12:44 PM

## 2013-05-01 NOTE — Patient Instructions (Addendum)
Continue tamoxifen 20 mg daily  Increase neurontin 300 mg four times a day  effexor 75 mg daily

## 2013-05-05 ENCOUNTER — Telehealth: Payer: Self-pay | Admitting: *Deleted

## 2013-05-05 NOTE — Telephone Encounter (Signed)
Last OV 05-02-12, last filled 01-11-12 #30 3

## 2013-05-07 NOTE — Telephone Encounter (Signed)
Ok for #30, 3 refills- needs to schedule CPE

## 2013-05-09 MED ORDER — ZOLPIDEM TARTRATE 10 MG PO TABS: 10.0000 mg | ORAL_TABLET | Freq: Every evening | ORAL | Status: DC | PRN

## 2013-05-09 NOTE — Telephone Encounter (Signed)
Rx printed and faxed to the pharmacy.  Mailed letter informing the pt that she is due for a CPE, and to please call the office an schedule an appt.//AB/CMA

## 2013-05-09 NOTE — Telephone Encounter (Signed)
Pt scheduled CPE for 06/21/13. She is upset that no one has called her yet and would like her medication refilled asap.

## 2013-05-10 ENCOUNTER — Other Ambulatory Visit: Payer: Self-pay | Admitting: *Deleted

## 2013-05-10 DIAGNOSIS — C50919 Malignant neoplasm of unspecified site of unspecified female breast: Secondary | ICD-10-CM

## 2013-05-10 MED ORDER — TAMOXIFEN CITRATE 20 MG PO TABS: 20.0000 mg | ORAL_TABLET | Freq: Every day | ORAL | Status: DC

## 2013-05-18 ENCOUNTER — Other Ambulatory Visit: Payer: Self-pay | Admitting: Family Medicine

## 2013-05-19 NOTE — Telephone Encounter (Signed)
Med faxed   

## 2013-05-19 NOTE — Telephone Encounter (Signed)
Last refill:04-18-13 Last CPE:05-02-12-has an appt for CPE on 06-21-13 Please advise.//AB/CMA

## 2013-06-21 ENCOUNTER — Other Ambulatory Visit: Payer: Self-pay | Admitting: Family Medicine

## 2013-06-21 ENCOUNTER — Ambulatory Visit (INDEPENDENT_AMBULATORY_CARE_PROVIDER_SITE_OTHER): Payer: BC Managed Care – PPO | Admitting: Family Medicine

## 2013-06-21 ENCOUNTER — Encounter: Payer: Self-pay | Admitting: Family Medicine

## 2013-06-21 VITALS — BP 110/70 | HR 67 | Temp 98.5°F | Ht 62.75 in | Wt 232.2 lb

## 2013-06-21 DIAGNOSIS — I10 Essential (primary) hypertension: Secondary | ICD-10-CM

## 2013-06-21 DIAGNOSIS — E669 Obesity, unspecified: Secondary | ICD-10-CM

## 2013-06-21 DIAGNOSIS — Z0000 Encounter for general adult medical examination without abnormal findings: Secondary | ICD-10-CM

## 2013-06-21 DIAGNOSIS — E559 Vitamin D deficiency, unspecified: Secondary | ICD-10-CM

## 2013-06-21 LAB — CBC WITH DIFFERENTIAL/PLATELET
Basophils Absolute: 0 10*3/uL (ref 0.0–0.1)
Eosinophils Relative: 1.9 % (ref 0.0–5.0)
Monocytes Absolute: 0.3 10*3/uL (ref 0.1–1.0)
Monocytes Relative: 6.8 % (ref 3.0–12.0)
Neutrophils Relative %: 49.3 % (ref 43.0–77.0)
Platelets: 284 10*3/uL (ref 150.0–400.0)
RDW: 13.4 % (ref 11.5–14.6)
WBC: 4.9 10*3/uL (ref 4.5–10.5)

## 2013-06-21 LAB — HEPATIC FUNCTION PANEL
ALT: 16 U/L (ref 0–35)
AST: 18 U/L (ref 0–37)
Albumin: 3.8 g/dL (ref 3.5–5.2)
Alkaline Phosphatase: 68 U/L (ref 39–117)
Total Protein: 7.5 g/dL (ref 6.0–8.3)

## 2013-06-21 LAB — BASIC METABOLIC PANEL
BUN: 10 mg/dL (ref 6–23)
CO2: 26 mEq/L (ref 19–32)
Chloride: 102 mEq/L (ref 96–112)
GFR: 115.35 mL/min (ref >60.00)
Glucose, Bld: 93 mg/dL (ref 70–99)
Potassium: 4 mEq/L (ref 3.5–5.1)

## 2013-06-21 LAB — TSH: TSH: 1.06 u[IU]/mL (ref 0.35–5.50)

## 2013-06-21 LAB — LDL CHOLESTEROL, DIRECT: Direct LDL: 151.7 mg/dL

## 2013-06-21 MED ORDER — ZOLPIDEM TARTRATE 10 MG PO TABS: 10.0000 mg | ORAL_TABLET | Freq: Every evening | ORAL | Status: DC | PRN

## 2013-06-21 MED ORDER — ATENOLOL-CHLORTHALIDONE 50-25 MG PO TABS: ORAL_TABLET | ORAL | Status: DC

## 2013-06-21 MED ORDER — LORCASERIN HCL 10 MG PO TABS: 1.0000 | ORAL_TABLET | Freq: Two times a day (BID) | ORAL | Status: DC

## 2013-06-21 MED ORDER — FUROSEMIDE 40 MG PO TABS: 40.0000 mg | ORAL_TABLET | Freq: Every day | ORAL | Status: DC | PRN

## 2013-06-21 NOTE — Assessment & Plan Note (Signed)
Pt's PE WNL w/ exception of obesity.  UTD on health maintenance.  Check labs.  Anticipatory guidance provided.  

## 2013-06-21 NOTE — Assessment & Plan Note (Signed)
Check labs.  Replete prn. 

## 2013-06-21 NOTE — Patient Instructions (Addendum)
Follow up in 1 month to recheck weight loss progress Start the Belviq twice daily Healthy diet, regular exercise We'll notify you of your lab results Call with any questions or concerns Happy Belated Birthday!

## 2013-06-21 NOTE — Progress Notes (Signed)
  Subjective:    Patient ID: [name], female    DOB: July 19, 1961, 52 y.o.   MRN: [id number]  HPI CPE- UTD on colonoscopy, mammo.  No concerns today.  Obesity- pt is asking about starting Belviq.  Exercising, eating well but having trouble losing weight.  BP well controlled.  Hx of hyperlipidemia.   Review of Systems Patient reports no vision/ hearing changes, adenopathy,fever, weight change,  persistant/recurrent hoarseness , swallowing issues, chest pain, palpitations, edema, persistant/recurrent cough, hemoptysis, dyspnea (rest/exertional/paroxysmal nocturnal), gastrointestinal bleeding (melena, rectal bleeding), abdominal pain, significant heartburn, bowel changes, GU symptoms (dysuria, hematuria, incontinence), Gyn symptoms (abnormal  bleeding, pain),  syncope, focal weakness, memory loss, skin/hair/nail changes, abnormal bruising or bleeding, anxiety, or depression.   + neuropathy from chemo    Objective:   Physical Exam General Appearance:    Alert, cooperative, no distress, appears stated age  Head:    Normocephalic, without obvious abnormality, atraumatic  Eyes:    PERRL, conjunctiva/corneas clear, EOM's intact, fundi    benign, both eyes  Ears:    Normal TM's and external ear canals, both ears  Nose:   Nares normal, septum midline, mucosa normal, no drainage    or sinus tenderness  Throat:   Lips, mucosa, and tongue normal; teeth and gums normal  Neck:   Supple, symmetrical, trachea midline, no adenopathy;    Thyroid: no enlargement/tenderness/nodules  Back:     Symmetric, no curvature, ROM normal, no CVA tenderness  Lungs:     Clear to auscultation bilaterally, respirations unlabored  Chest Wall:    No tenderness or deformity   Heart:    Regular rate and rhythm, S1 and S2 normal, no murmur, rub   or gallop  Breast Exam:    Deferred to GYN  Abdomen:     Soft, non-tender, bowel sounds active all four quadrants,    no masses, no organomegaly  Genitalia:    Deferred to GYN   Rectal:    Extremities:   Extremities normal, atraumatic, no cyanosis or edema  Pulses:   2+ and symmetric all extremities  Skin:   Skin color, texture, turgor normal, no rashes or lesions  Lymph nodes:   Cervical, supraclavicular, and axillary nodes normal  Neurologic:   CNII-XII intact, normal strength, sensation and reflexes    throughout          Assessment & Plan:

## 2013-06-21 NOTE — Assessment & Plan Note (Signed)
Chronic problem.  Worsening since starting tamoxifen.  Reviewed importance of healthy diet and regular exercise.  Start Belviq.  Follow closely.

## 2013-06-22 ENCOUNTER — Telehealth: Payer: Self-pay

## 2013-06-22 ENCOUNTER — Other Ambulatory Visit: Payer: Self-pay | Admitting: Family Medicine

## 2013-06-22 DIAGNOSIS — E785 Hyperlipidemia, unspecified: Secondary | ICD-10-CM

## 2013-06-22 MED ORDER — SIMVASTATIN 20 MG PO TABS: 20.0000 mg | ORAL_TABLET | Freq: Every evening | ORAL | Status: DC

## 2013-06-22 NOTE — Telephone Encounter (Signed)
New Zocor prescription sent to pharmacy. ghf

## 2013-06-22 NOTE — Telephone Encounter (Signed)
Discontinued per Dr Beverely Low. New dosage

## 2013-06-25 LAB — VITAMIN D 1,25 DIHYDROXY
Vitamin D 1, 25 (OH)2 Total: 53 pg/mL (ref 18–72)
Vitamin D2 1, 25 (OH)2: <8 pg/mL
Vitamin D3 1, 25 (OH)2: 53 pg/mL

## 2013-06-26 ENCOUNTER — Encounter: Payer: Self-pay | Admitting: *Deleted

## 2013-07-05 ENCOUNTER — Encounter: Payer: Self-pay | Admitting: Family Medicine

## 2013-07-21 ENCOUNTER — Ambulatory Visit: Payer: BC Managed Care – PPO | Admitting: Family Medicine

## 2013-10-19 ENCOUNTER — Other Ambulatory Visit: Payer: Self-pay | Admitting: Family Medicine

## 2013-10-20 NOTE — Telephone Encounter (Signed)
Last OV 06-21-13 Med filled 06-21-13 #30 with 3  Moderate risk

## 2013-11-01 ENCOUNTER — Telehealth: Payer: Self-pay | Admitting: *Deleted

## 2013-11-01 ENCOUNTER — Encounter (INDEPENDENT_AMBULATORY_CARE_PROVIDER_SITE_OTHER): Payer: Self-pay

## 2013-11-01 ENCOUNTER — Encounter: Payer: Self-pay | Admitting: Adult Health

## 2013-11-01 ENCOUNTER — Other Ambulatory Visit: Payer: BC Managed Care – PPO | Admitting: Lab

## 2013-11-01 ENCOUNTER — Ambulatory Visit (HOSPITAL_BASED_OUTPATIENT_CLINIC_OR_DEPARTMENT_OTHER): Payer: BC Managed Care – PPO | Admitting: Adult Health

## 2013-11-01 VITALS — BP 129/78 | HR 84 | Temp 98.1°F | Resp 20 | Ht 62.75 in | Wt 235.1 lb

## 2013-11-01 DIAGNOSIS — N959 Unspecified menopausal and perimenopausal disorder: Secondary | ICD-10-CM

## 2013-11-01 DIAGNOSIS — C50919 Malignant neoplasm of unspecified site of unspecified female breast: Secondary | ICD-10-CM

## 2013-11-01 DIAGNOSIS — C50912 Malignant neoplasm of unspecified site of left female breast: Secondary | ICD-10-CM

## 2013-11-01 DIAGNOSIS — IMO0002 Reserved for concepts with insufficient information to code with codable children: Secondary | ICD-10-CM

## 2013-11-01 LAB — COMPREHENSIVE METABOLIC PANEL (CC13)
Anion Gap: 12 mEq/L — ABNORMAL HIGH (ref 3–11)
BUN: 16 mg/dL (ref 7.0–26.0)
CO2: 24 mEq/L (ref 22–29)
Calcium: 9.9 mg/dL (ref 8.4–10.4)
Chloride: 103 mEq/L (ref 98–109)
Creatinine: 1.1 mg/dL (ref 0.6–1.1)
Glucose: 135 mg/dl (ref 70–140)
Total Bilirubin: 0.32 mg/dL (ref 0.20–1.20)

## 2013-11-01 LAB — CBC WITH DIFFERENTIAL/PLATELET
Basophils Absolute: 0.1 10*3/uL (ref 0.0–0.1)
HCT: 39.3 % (ref 34.8–46.6)
HGB: 13.4 g/dL (ref 11.6–15.9)
LYMPH%: 37.2 % (ref 14.0–49.7)
MONO#: 0.3 10*3/uL (ref 0.1–0.9)
NEUT%: 54.4 % (ref 38.4–76.8)
Platelets: 280 10*3/uL (ref 145–400)
WBC: 6.1 10*3/uL (ref 3.9–10.3)
lymph#: 2.3 10*3/uL (ref 0.9–3.3)

## 2013-11-01 NOTE — Telephone Encounter (Signed)
appts made and printed...td 

## 2013-11-01 NOTE — Patient Instructions (Signed)
Acupuncture Acupuncture began in [place] some 3,000 years ago. It has been widely used in [place] since the early 1900s to relieve pain. A growing number of doctors find this method to be useful but cannot medically explain how it works. Patients are asking about acupuncture more often, especially when standard treatments have not made their pain go away. The theory that, in part, acupuncture works through the release of brain neurotransmitters is gaining scientific support. Some evidence shows that acupuncture causes a release of endorphins in the brain. Endorphins are chemicals the body produces to feel good or to feel less pain. PROCEDURE To treat pain, caregivers choose exact points to place needles. Points are chosen from within the same nerve (neural) segment next to the area of pain. Points may also be chosen from the arms and legs where many muscles are located. One of the most effective acupuncture points, Hoku, is located at the base of the thumb. The thumb has the largest number of connections to the brain. TREATMENT   Acupuncture needles are placed deeply into a muscle. This causes changes in the central nervous system that make pain go away. The relief from pain lasts a long time.  Other forms of treatment such as moxibustion may be used at the acupuncture point. This is a form of heat therapy.  A variety of massage and movement techniques may also be used to relieve pain. National [employer] [facility]) Document Released: 12/17/2003 Document Revised: 04/10/2013 Document Reviewed: 05/30/2009 [facility] Patient Information 2014 [place], [place].

## 2013-11-01 NOTE — Progress Notes (Addendum)
OFFICE PROGRESS NOTE  CC [name] M.D. [name] M.D.  [name], MD 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  PRIOR THERAPY:  #1 patient is status post lumpectomy with sentinel node biopsy. The final pathology revealed a 1.2 cm invasive lobular carcinoma. One sentinel node was positive for micrometastatic disease. The tumor was grade 3 there was estrogen receptor +100% progesterone receptor +100% proliferation marker 12% and HER-2/neu negative.  #2 the patient underwent Oncotype DX studies on her tissue that showed a recurrence score of 22 with an average risk of 14% with 5 years of tamoxifen.  #3 she then received adjuvant chemotherapy initially consisting of Adriamycin Cytoxan administered from 03/17/2011 to 04/28/2011. She then went on to receive single agent Taxol from 05/12/2011 2 06/30/2011 for a total of 8 weeks.  #4 the patient received radiation therapy from 08/28/2011 and completed on 10/13/2011.  #5 patient is now on adjuvant tamoxifen 20 mg daily starting November 2012. A total of 5-10 years of therapy is planned.  CURRENT THERAPY: tamoxifen 20 mg daily  INTERVAL HISTORY: [name] 52 y.o. female returns for followup visit.  She is doing moderately well on the Tamoxifen.  She has hot flashes, joint aches, vaginal dryness.  She does take Effexor XR, and continues to have severe hot flashes.  She denies any new pain, or unintnentional weight loss, or any further concerns.     MEDICAL HISTORY: Past Medical History  Diagnosis Date  . Metabolic syndrome   . Hypertension   . Hyperlipidemia   . Depression   . Obesity   . Adenocarcinoma, breast   . Vitamin D deficiency   . Anxiety   . Blood transfusion 2004    ALLERGIES:  has No Known Allergies.  MEDICATIONS:  Current Outpatient Prescriptions  Medication Sig Dispense Refill   . atenolol-chlorthalidone (TENORETIC) 50-25 MG per tablet TAKE 1/2 TABLETS BY MOUTH DAILY.  30 tablet  5  . furosemide (LASIX) 40 MG tablet Take 1 tablet (40 mg total) by mouth daily as needed.  30 tablet  5  . gabapentin (NEURONTIN) 300 MG capsule Take 1 capsule (300 mg total) by mouth 4 (four) times daily.  180 capsule  8  . simvastatin (ZOCOR) 20 MG tablet Take 1 tablet (20 mg total) by mouth every evening.  30 tablet  5  . tamoxifen (NOLVADEX) 20 MG tablet Take 1 tablet (20 mg total) by mouth daily.  90 tablet  1  . venlafaxine XR (EFFEXOR-XR) 75 MG 24 hr capsule Take 1 capsule (75 mg total) by mouth daily.  90 capsule  12  . zolpidem (AMBIEN) 10 MG tablet TAKE 1 TABLET BY MOUTH AT BEDTIME AS NEEDED FOR SLEEP  30 tablet  3  . Lorcaserin HCl (BELVIQ) 10 MG TABS Take 1 tablet by mouth 2 (two) times daily.  60 tablet  3   No current facility-administered medications for this visit.    SURGICAL HISTORY:  Past Surgical History  Procedure Laterality Date  . Total abdominal hysterectomy  2005  . Btl    . Tonsillectomy    . Carpal tunnel release    . Port-a-cath removal  02/19/2012    Procedure: MINOR REMOVAL PORT-A-CATH;  Surgeon: Mariella Saa, MD;  Location: [facility];  Service: General;  Laterality: Right;  . Port-a-cath removal  02/19/2012    Procedure: MINOR REMOVAL PORT-A-CATH;  Surgeon:  Mariella Saa, MD;  Location: [facility];  Service: General;  Laterality: Right;    REVIEW OF SYSTEMS:  A 10 point review of systems was conducted and is otherwise negative except for what is noted above.    Health Maintenance Mammogram: 12/2012 Colonoscopy: 06/2012, f/u in 10 years recommended Bone Density Scan: n/a Pap Smear: s/p TAH, ovaries remain Eye Exam: 2013 Vitamin D Level: 05/2013 Lipid Panel: 05/2013  PHYSICAL EXAMINATION: BP 129/78  Pulse 84  Temp(Src) 98.1 F (36.7 C) (Oral)  Resp 20  Ht 5' 2.75" (1.594 m)  Wt 235 lb 1.6 oz (106.641 kg)   BMI 41.97 kg/m2 General: Patient is a well appearing female in no acute distress HEENT: PERRLA, sclerae anicteric no conjunctival pallor, MMM Neck: supple, no palpable adenopathy Lungs: clear to auscultation bilaterally, no wheezes, rhonchi, or rales Cardiovascular: regular rate rhythm, S1, S2, no murmurs, rubs or gallops Abdomen: Soft, non-tender, non-distended, normoactive bowel sounds, no HSM Extremities: warm and well perfused, no clubbing, cyanosis, or edema Skin: No rashes or lesions Neuro: Non-focal Bilateral breasts are examined. Left breast reveals a well-healed surgical scar in the upper outer quadrant. There is no nipple retraction no masses no nipple notes other skin changes other than darkening of the skin secondary to radiation therapy. Right breast no masses nipple discharge or retraction ECOG PERFORMANCE STATUS: 0 - Asymptomatic   LABORATORY DATA: Lab Results  Component Value Date   WBC 6.1 11/01/2013   HGB 13.4 11/01/2013   HCT 39.3 11/01/2013   MCV 93.8 11/01/2013   PLT 280 11/01/2013      Chemistry      Component Value Date/Time   NA 140 11/01/2013 1153   NA 138 06/21/2013 1116   K 3.7 11/01/2013 1153   K 4.0 06/21/2013 1116   CL 102 06/21/2013 1116   CL 104 05/01/2013 1203   CO2 24 11/01/2013 1153   CO2 26 06/21/2013 1116   BUN 16.0 11/01/2013 1153   BUN 10 06/21/2013 1116   CREATININE 1.1 11/01/2013 1153   CREATININE 0.7 06/21/2013 1116      Component Value Date/Time   CALCIUM 9.9 11/01/2013 1153   CALCIUM 9.8 06/21/2013 1116   ALKPHOS 92 11/01/2013 1153   ALKPHOS 68 06/21/2013 1116   AST 16 11/01/2013 1153   AST 18 06/21/2013 1116   ALT 13 11/01/2013 1153   ALT 16 06/21/2013 1116   BILITOT 0.32 11/01/2013 1153   BILITOT 0.3 06/21/2013 1116       RADIOGRAPHIC STUDIES:  No results found.  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  #2 she is status post  lumpectomy followed by adjuvant chemotherapy. Chemotherapy consisted of 4 cycles of dose dense Adriamycin and Cytoxan followed by a weeks of single agent Taxol. 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 She therefore only received 8 weeks of Taxol.  #3 patient is status post radiation therapy.   #4 patient then began on tamoxifen 20 mg daily. Thus far she is tolerating it well.   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 I will increase this to 4 times a day.  #6 hot flashes being managed by Effexor 75 mg daily.  PLAN:  #1 Patient is doing well.  She will continue taking Tamoxifen daily.  She will continue Effexor for the hot flashes.  We also recommended acupuncture.  I  gave her a book on sexual health and breast cancer with good recommendations on vaginal dryness.  I counseled her on survivorship and health maintenance.    #2 she will be seen back in 6 months time in followup.   All questions were answered. The patient knows to call the clinic with any problems, questions or concerns. We can certainly see the patient much sooner if necessary.  I spent 25 minutes counseling the patient face to face. The total time spent in the appointment was 30 minutes.  Illa Level, NP Medical Oncology [facility] [phone] 11/01/2013, 2:34 PM   ATTENDING'S ATTESTATION:  I personally reviewed patient's chart, examined patient myself, formulated the treatment plan as followed.    20 mg daily. She is tolerating it well. Her hot flashes are tolerable with Effexor. However she still continues to have them. We discussed the possibility of doing acupuncture exercise and healthy.  Patient will be seen back in 6 months time for  Drue Second, MD Medical/Oncology [facility] [phone] (beeper) [phone] (Office)  11/03/2013, 5:04 PM

## 2013-11-11 ENCOUNTER — Other Ambulatory Visit: Payer: Self-pay | Admitting: Oncology

## 2013-12-05 ENCOUNTER — Other Ambulatory Visit: Payer: Self-pay | Admitting: Oncology

## 2013-12-05 DIAGNOSIS — Z9889 Other specified postprocedural states: Secondary | ICD-10-CM

## 2013-12-05 DIAGNOSIS — Z853 Personal history of malignant neoplasm of breast: Secondary | ICD-10-CM

## 2014-01-05 ENCOUNTER — Ambulatory Visit
Admission: RE | Admit: 2014-01-05 | Discharge: 2014-01-05 | Disposition: A | Discharge status: Home / Self Care | Payer: BC Managed Care – PPO | Source: Ambulatory Visit | Attending: Oncology | Admitting: Oncology

## 2014-01-05 DIAGNOSIS — Z9889 Other specified postprocedural states: Secondary | ICD-10-CM

## 2014-01-05 DIAGNOSIS — Z853 Personal history of malignant neoplasm of breast: Secondary | ICD-10-CM

## 2014-02-03 ENCOUNTER — Ambulatory Visit: Payer: BC Managed Care – PPO | Admitting: Family Medicine

## 2014-02-09 ENCOUNTER — Encounter: Payer: Self-pay | Admitting: Family Medicine

## 2014-02-09 ENCOUNTER — Other Ambulatory Visit: Payer: Self-pay | Admitting: General Practice

## 2014-02-09 ENCOUNTER — Ambulatory Visit (INDEPENDENT_AMBULATORY_CARE_PROVIDER_SITE_OTHER): Payer: BC Managed Care – PPO | Admitting: Family Medicine

## 2014-02-09 VITALS — BP 130/80 | HR 81 | Temp 98.2°F | Resp 16 | Wt 227.5 lb

## 2014-02-09 DIAGNOSIS — M25569 Pain in unspecified knee: Secondary | ICD-10-CM

## 2014-02-09 DIAGNOSIS — M25561 Pain in right knee: Secondary | ICD-10-CM | POA: Insufficient documentation

## 2014-02-09 DIAGNOSIS — G4700 Insomnia, unspecified: Secondary | ICD-10-CM

## 2014-02-09 DIAGNOSIS — E669 Obesity, unspecified: Secondary | ICD-10-CM

## 2014-02-09 DIAGNOSIS — M25562 Pain in left knee: Secondary | ICD-10-CM

## 2014-02-09 LAB — SEDIMENTATION RATE: SED RATE: 27 mm/h — AB (ref 0–22)

## 2014-02-09 LAB — CK: Total CK: 99 U/L (ref 7–177)

## 2014-02-09 MED ORDER — PHENTERMINE HCL 37.5 MG PO CAPS: 37.5000 mg | ORAL_CAPSULE | ORAL | Status: DC

## 2014-02-09 MED ORDER — ZOLPIDEM TARTRATE 10 MG PO TABS: ORAL_TABLET | ORAL | Status: DC

## 2014-02-09 NOTE — Assessment & Plan Note (Signed)
Pt having severe pain of hips, knees, ankles.  Will stop Simvastatin and see if sxs improve.  Check CK and ESR.  May be side effect of Tamoxifen but stopping this med is last resort.  Refer to ortho for evaluation and symptom management.  Will follow.

## 2014-02-09 NOTE — Progress Notes (Signed)
   Subjective:    Patient ID: [name], female    DOB: 08-17-1961, 53 y.o.   MRN: [id number]  HPI Obesity- pt has been taking samples of Belviq b/c prescription was too expensive.  Still interested in losing weight but having hard time w/ Tamoxifen.  No palpitations.  Leg pain- pt reports knees and legs are extremely painful.  Aware that this is side effect of Tamoxifen.  Hips, knees, and ankles are all painful.  2-3 Aleve 'sorta take the edge off'.  Pain is separate from the neuropathy.  sxs started 4-5 weeks ago.  By end of day, pain and weakness is severe and difficult to walk.  Has seen [place] ortho previously but this is more severe.  Insomnia- refill on Ambien provided.   Review of Systems For ROS see HPI     Objective:   Physical Exam  Vitals reviewed. Constitutional: She is oriented to person, place, and time. She appears well-developed and well-nourished. No distress.  obese  HENT:  Head: Normocephalic and atraumatic.  Eyes: Conjunctivae and EOM are normal. Pupils are equal, round, and reactive to light.  Neck: Normal range of motion. Neck supple. No thyromegaly present.  Cardiovascular: Normal rate, regular rhythm, normal heart sounds and intact distal pulses.   No murmur heard. Pulmonary/Chest: Effort normal and breath sounds normal. No respiratory distress.  Abdominal: Soft. She exhibits no distension. There is no tenderness.  Musculoskeletal: She exhibits no edema.  Pain in hips, knees, ankles bilaterally  Lymphadenopathy:    She has no cervical adenopathy.  Neurological: She is alert and oriented to person, place, and time.  Skin: Skin is warm and dry.  Psychiatric: She has a normal mood and affect. Her behavior is normal.          Assessment & Plan:

## 2014-02-09 NOTE — Progress Notes (Signed)
Pre visit review using our clinic review tool, if applicable. No additional management support is needed unless otherwise documented below in the visit note. 

## 2014-02-09 NOTE — Assessment & Plan Note (Signed)
Chronic problem.  Wants to take med for weight loss but Belviq is expensive.  Will start phentermine to see if pt is able to lose weight w/o side effects.  Will follow.

## 2014-02-09 NOTE — Assessment & Plan Note (Signed)
Chronic problem.  Well controlled w/ Ambien.  Refill provided.

## 2014-02-09 NOTE — Patient Instructions (Signed)
Follow up in 6 weeks to recheck weight, BP, and pulse STOP the simvastatin We'll notify you of your lab results and make any changes if needed We'll call you with your ortho appt STOP the Belviq START the phentermine Call with any questions or concerns Hang in there!!!

## 2014-02-12 ENCOUNTER — Encounter: Payer: Self-pay | Admitting: General Practice

## 2014-03-07 ENCOUNTER — Other Ambulatory Visit: Payer: Self-pay | Admitting: Family Medicine

## 2014-03-07 NOTE — Telephone Encounter (Signed)
Med filled.  

## 2014-04-24 ENCOUNTER — Ambulatory Visit (INDEPENDENT_AMBULATORY_CARE_PROVIDER_SITE_OTHER): Payer: BC Managed Care – PPO | Admitting: Internal Medicine

## 2014-04-24 ENCOUNTER — Encounter: Payer: Self-pay | Admitting: Internal Medicine

## 2014-04-24 VITALS — BP 125/80 | HR 90 | Temp 98.4°F | Wt 222.0 lb

## 2014-04-24 DIAGNOSIS — J029 Acute pharyngitis, unspecified: Secondary | ICD-10-CM

## 2014-04-24 DIAGNOSIS — J069 Acute upper respiratory infection, unspecified: Secondary | ICD-10-CM

## 2014-04-24 LAB — POCT RAPID STREP A (OFFICE): RAPID STREP A SCREEN: NEGATIVE

## 2014-04-24 MED ORDER — ZOLPIDEM TARTRATE 10 MG PO TABS: ORAL_TABLET | ORAL | Status: DC

## 2014-04-24 NOTE — Progress Notes (Signed)
Subjective:    Patient ID: [name], female    DOB: Aug 01, 1961, 53 y.o.   MRN: [id number]  DOS:  04/24/2014 Type of  visit: Acute visit Symptoms started 4 days ago: Cough, sinus congestion, sore throat. She is having some yellow/clear nasal discharge and mild production of yellow sputum. 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 No chest pain and difficulty breathing No nausea, vomiting, diarrhea. Had myalgia with the onset of symptoms, that is resolved. Denies itchy eyes nose or sneezing.  Past Medical History  Diagnosis Date  . Metabolic syndrome   . Hypertension   . Hyperlipidemia   . Depression   . Obesity   . Adenocarcinoma, breast   . Vitamin D deficiency   . Anxiety   . Blood transfusion 2004    Past Surgical History  Procedure Laterality Date  . Total abdominal hysterectomy  2005  . Btl    . Tonsillectomy    . Carpal tunnel release    . Port-a-cath removal  02/19/2012    Procedure: MINOR REMOVAL PORT-A-CATH;  Surgeon: Edward Jolly, MD;  Location: [facility];  Service: General;  Laterality: Right;  . Port-a-cath removal  02/19/2012    Procedure: MINOR REMOVAL PORT-A-CATH;  Surgeon: Edward Jolly, MD;  Location: [facility];  Service: General;  Laterality: Right;    History   Social History  . Marital Status: Married    Spouse Name: N/A    Number of Children: N/A  . Years of Education: N/A   Occupational History  . Not on file.   Social History Main Topics  . Smoking status: Former Smoker    Quit date: 12/28/1989  . Smokeless tobacco: Never Used  . Alcohol Use: Yes     Comment: 1 glass of wine once a month  . Drug Use: No  . Sexual Activity: Yes   Other Topics Concern  . 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        Medication List         This list is accurate as of: 04/24/14 12:54 PM.  Always use your most recent med list.               atenolol-chlorthalidone 50-25 MG per tablet  Commonly known as:  TENORETIC  TAKE 1/2 TABLET BY MOUTH DAILY     furosemide 40 MG tablet  Commonly known as:  LASIX  Take 1 tablet (40 mg total) by mouth daily as needed.     gabapentin 300 MG capsule  Commonly known as:  NEURONTIN  Take 1 capsule (300 mg total) by mouth 4 (four) times daily.     Lorcaserin HCl 10 MG Tabs  Commonly known as:  BELVIQ  Take 1 tablet by mouth 2 (two) times daily.     phentermine 37.5 MG capsule  Take 1 capsule (37.5 mg total) by mouth every morning.     simvastatin 20 MG tablet  Commonly known as:  ZOCOR  Take 1 tablet (20 mg total) by mouth every evening.     tamoxifen 20 MG tablet  Commonly known as:  NOLVADEX  TAKE 1 TABLET (20 MG TOTAL) BY MOUTH DAILY.     venlafaxine XR 75 MG 24 hr capsule  Commonly known as:  EFFEXOR-XR  Take 1 capsule (75 mg total) by mouth daily.     zolpidem 10 MG tablet  Commonly known as:  AMBIEN  TAKE 1 TABLET BY MOUTH AT BEDTIME AS NEEDED FOR SLEEP           Objective:   Physical Exam BP 125/80  Pulse 90  Temp(Src) 98.4 F (36.9 C)  Wt 222 lb (100.699 kg)  SpO2 97% General -- alert, well-developed, NAD.  HEENT-- Not pale. TMs normal, throat symmetric, no redness or discharge. Face symmetric, sinuses not tender to palpation. Nose slt  congested. Lungs -- normal respiratory effort, no intercostal retractions, no accessory muscle use, and normal breath sounds.  Heart-- normal rate, regular rhythm, no murmur.   Extremities-- no pretibial edema bilaterally  Neurologic--  alert & oriented X3. Speech normal, gait normal, strength normal in all extremities.  Psych-- Cognition and judgment appear intact. Cooperative with normal attention span and concentration. No anxious or depressed appearing.     Assessment & Plan:    URI--  strp test neg. see  instructions

## 2014-04-24 NOTE — Progress Notes (Signed)
Pre visit review using our clinic review tool, if applicable. No additional management support is needed unless otherwise documented below in the visit note. 

## 2014-04-24 NOTE — Patient Instructions (Signed)
Rest, fluids , tylenol For cough, take Mucinex DM twice a day as needed  For congestion use OTC Nasocort: 2 nasal sprays on each side of the nose daily until you feel better Claritin OTC 10 mg 1 tab a day as needed Call if no better in few days Call anytime if the symptoms are severe

## 2014-04-25 ENCOUNTER — Telehealth: Payer: Self-pay | Admitting: Oncology

## 2014-04-25 NOTE — Telephone Encounter (Signed)
kk out pt to see cp2 5/29. s/w pt she is aware.

## 2014-04-30 ENCOUNTER — Telehealth: Payer: Self-pay | Admitting: Family Medicine

## 2014-04-30 DIAGNOSIS — R059 Cough, unspecified: Secondary | ICD-10-CM

## 2014-04-30 DIAGNOSIS — R05 Cough: Secondary | ICD-10-CM

## 2014-04-30 MED ORDER — PROMETHAZINE-DM 6.25-15 MG/5ML PO SYRP: 5.0000 mL | ORAL_SOLUTION | Freq: Three times a day (TID) | ORAL | Status: DC | PRN

## 2014-04-30 NOTE — Telephone Encounter (Signed)
Caller name:Esti Kimmey Relation to BU:[id number] Call back number:[phone] Pharmacy:  Reason for call: Patient called and stated that she is still not feeling better. Patient still has a sore throat and a cough. Patient was seen by Dr Larose Kells on 04/24/2014. Please advise patient her next step.

## 2014-04-30 NOTE — Telephone Encounter (Signed)
Spoke with patient who states that she has followed the directions. Still experiencing sore throat and the cough is keeping the throat irritated. Would like something to help with the cough. Please advise.

## 2014-04-30 NOTE — Telephone Encounter (Signed)
Rx sent to [facility]. Patient notified.

## 2014-04-30 NOTE — Telephone Encounter (Signed)
Ok for Promethazine-Dextromethorphan cough syrup, 5 ml, TID PRN, #237ml, no refills

## 2014-05-01 ENCOUNTER — Ambulatory Visit: Payer: BC Managed Care – PPO | Admitting: Adult Health

## 2014-05-01 ENCOUNTER — Other Ambulatory Visit: Payer: BC Managed Care – PPO

## 2014-05-21 ENCOUNTER — Other Ambulatory Visit: Payer: Self-pay | Admitting: Oncology

## 2014-05-21 DIAGNOSIS — C50919 Malignant neoplasm of unspecified site of unspecified female breast: Secondary | ICD-10-CM

## 2014-05-24 ENCOUNTER — Other Ambulatory Visit: Payer: Self-pay | Admitting: *Deleted

## 2014-05-24 DIAGNOSIS — C50919 Malignant neoplasm of unspecified site of unspecified female breast: Secondary | ICD-10-CM

## 2014-05-25 ENCOUNTER — Ambulatory Visit (HOSPITAL_BASED_OUTPATIENT_CLINIC_OR_DEPARTMENT_OTHER): Payer: BC Managed Care – PPO | Admitting: Internal Medicine

## 2014-05-25 ENCOUNTER — Other Ambulatory Visit (HOSPITAL_BASED_OUTPATIENT_CLINIC_OR_DEPARTMENT_OTHER): Payer: BC Managed Care – PPO

## 2014-05-25 VITALS — BP 122/71 | HR 76 | Temp 98.6°F | Resp 20 | Ht 62.75 in | Wt 222.6 lb

## 2014-05-25 DIAGNOSIS — R232 Flushing: Secondary | ICD-10-CM

## 2014-05-25 DIAGNOSIS — C773 Secondary and unspecified malignant neoplasm of axilla and upper limb lymph nodes: Secondary | ICD-10-CM

## 2014-05-25 DIAGNOSIS — C50919 Malignant neoplasm of unspecified site of unspecified female breast: Secondary | ICD-10-CM

## 2014-05-25 DIAGNOSIS — C50119 Malignant neoplasm of central portion of unspecified female breast: Secondary | ICD-10-CM

## 2014-05-25 DIAGNOSIS — G622 Polyneuropathy due to other toxic agents: Secondary | ICD-10-CM

## 2014-05-25 DIAGNOSIS — Z170 Estrogen receptor positive status [ER+]: Secondary | ICD-10-CM

## 2014-05-25 DIAGNOSIS — IMO0002 Reserved for concepts with insufficient information to code with codable children: Secondary | ICD-10-CM

## 2014-05-25 DIAGNOSIS — N959 Unspecified menopausal and perimenopausal disorder: Secondary | ICD-10-CM

## 2014-05-25 DIAGNOSIS — T451X5A Adverse effect of antineoplastic and immunosuppressive drugs, initial encounter: Secondary | ICD-10-CM

## 2014-05-25 LAB — CBC WITH DIFFERENTIAL/PLATELET
BASO%: 1.2 % (ref 0.0–2.0)
Basophils Absolute: 0.1 10*3/uL (ref 0.0–0.1)
EOS%: 3.1 % (ref 0.0–7.0)
Eosinophils Absolute: 0.2 10*3/uL (ref 0.0–0.5)
HCT: 37.1 % (ref 34.8–46.6)
HGB: 12.3 g/dL (ref 11.6–15.9)
LYMPH#: 2.2 10*3/uL (ref 0.9–3.3)
LYMPH%: 35.5 % (ref 14.0–49.7)
MCH: 31.5 pg (ref 25.1–34.0)
MCHC: 33.3 g/dL (ref 31.5–36.0)
MCV: 94.6 fL (ref 79.5–101.0)
MONO#: 0.4 10*3/uL (ref 0.1–0.9)
MONO%: 6.6 % (ref 0.0–14.0)
NEUT#: 3.3 10*3/uL (ref 1.5–6.5)
NEUT%: 53.6 % (ref 38.4–76.8)
Platelets: 269 10*3/uL (ref 145–400)
RBC: 3.92 10*6/uL (ref 3.70–5.45)
RDW: 13.5 % (ref 11.2–14.5)
WBC: 6.1 10*3/uL (ref 3.9–10.3)

## 2014-05-25 LAB — COMPREHENSIVE METABOLIC PANEL (CC13)
ALT: 10 U/L (ref 0–55)
ANION GAP: 13 meq/L — AB (ref 3–11)
AST: 11 U/L (ref 5–34)
Albumin: 3.5 g/dL (ref 3.5–5.0)
Alkaline Phosphatase: 81 U/L (ref 40–150)
BILIRUBIN TOTAL: 0.29 mg/dL (ref 0.20–1.20)
BUN: 15.8 mg/dL (ref 7.0–26.0)
CHLORIDE: 103 meq/L (ref 98–109)
CO2: 23 meq/L (ref 22–29)
Calcium: 9.6 mg/dL (ref 8.4–10.4)
Creatinine: 0.8 mg/dL (ref 0.6–1.1)
Glucose: 112 mg/dl (ref 70–140)
Potassium: 3.4 mEq/L — ABNORMAL LOW (ref 3.5–5.1)
Sodium: 139 mEq/L (ref 136–145)
TOTAL PROTEIN: 7.5 g/dL (ref 6.4–8.3)

## 2014-05-25 NOTE — Progress Notes (Signed)
OFFICE PROGRESS NOTE  CC Excell Seltzer M.D. Thea Silversmith M.D.  Annye Asa, MD [phone] [id number]  CHIEF COMPLAINT: Breast cancer Followup  HISTORY OF PRESENT ILLNESS:  53 year old female with 1) Stage II LEFT breast invasive lobular carcinoma diagnosed December 2011 status post lumpectomy and sentinel lymph node biopsy, 1.2 cm invasive lobular carcinoma, with micrometastatic disease within one sentinel lymph node, grade 3, ER positive PR positive, HER-2/neu negative  Breast cancer History: - status post lumpectomy with sentinel node biopsy. The final pathology revealed a 1.2 cm invasive lobular carcinoma. One sentinel node was positive for micrometastatic disease. The tumor was grade 3 there was estrogen receptor +100% progesterone receptor +100% proliferation marker 12% and HER-2/neu negative. - Oncotype DX studies on her tissue that showed a recurrence score of 22 with an average risk of 14% with 5 years of tamoxifen. - status post adjuvant chemotherapy initially consisting of Adriamycin Cytoxan administered from 03/17/2011 to 04/28/2011. She then went on to receive single agent Taxol from 05/12/2011 2 06/30/2011 for a total of 8 weeks. - status post radiation therapy from 08/28/2011 and completed on 10/13/2011. - on adjuvant tamoxifen 20 mg daily starting November 2012. A total of 5-10 years of therapy is planned.  CURRENT THERAPY: tamoxifen 20 mg daily  INTERVAL HISTORY: [name] 52 y.o. female returns for followup visit. She complains of moderately severe hot flashes, up to "100 times a day". She believes it may be worse during the summer. Effects are at 75 mg a day is not doing enough for her. She however continues to take her tamoxifen on a daily basis i.e. is compliant.  She denies any new arthralgias or myalgias. Food intake is good. No problems with bowel movement or urine function. She is not sure if she is menopausal at this point. She  did have some testing done in the past and was told that she was not menopausal. 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 It is not impacting her QOL.   MEDICAL HISTORY: Past Medical History  Diagnosis Date  . Metabolic syndrome   . Hypertension   . Hyperlipidemia   . Depression   . Obesity   . Adenocarcinoma, breast   . Vitamin D deficiency   . Anxiety   . Blood transfusion 2004   ALLERGIES:  has No Known Allergies.  MEDICATIONS:  Current Outpatient Prescriptions  Medication Sig Dispense Refill  . atenolol-chlorthalidone (TENORETIC) 50-25 MG per tablet TAKE 1/2 TABLET BY MOUTH DAILY  12 tablet  5  . furosemide (LASIX) 40 MG tablet Take 1 tablet (40 mg total) by mouth daily as needed.  30 tablet  5  . gabapentin (NEURONTIN) 300 MG capsule Take 300 mg by mouth 4 (four) times daily. As needed      . phentermine 37.5 MG capsule Take 1 capsule (37.5 mg total) by mouth every morning.  30 capsule  2  . simvastatin (ZOCOR) 20 MG tablet Take 1 tablet (20 mg total) by mouth every evening.  30 tablet  5  . tamoxifen (NOLVADEX) 20 MG tablet TAKE 1 TABLET (20 MG TOTAL) BY MOUTH DAILY.  90 tablet  1  . venlafaxine XR (EFFEXOR-XR) 75 MG 24 hr capsule TAKE 1 CAPSULE (75 MG TOTAL) BY MOUTH DAILY.  90 capsule  0  . zolpidem (AMBIEN) 10 MG tablet TAKE 1 TABLET BY MOUTH AT BEDTIME AS NEEDED FOR SLEEP  30 tablet  1   No current facility-administered medications for this visit.  SURGICAL HISTORY:  Past Surgical History  Procedure Laterality Date  . Total abdominal hysterectomy  2005  . Btl    . Tonsillectomy    . Carpal tunnel release    . Port-a-cath removal  02/19/2012    Procedure: MINOR REMOVAL PORT-A-CATH;  Surgeon: Edward Jolly, MD;  Location: [place];  Service: General;  Laterality: Right;  . Port-a-cath removal  02/19/2012    Procedure: MINOR REMOVAL PORT-A-CATH;  Surgeon: Edward Jolly, MD;  Location: [place];  Service:  General;  Laterality: Right;    REVIEW OF SYSTEMS:  A 10 point review of systems was conducted and is otherwise negative except for what is noted above.    PHYSICAL EXAMINATION: BP 122/71  Pulse 76  Temp(Src) 98.6 F (37 C) (Oral)  Resp 20  Ht 5' 2.75" (1.594 m)  Wt 222 lb 9.6 oz (100.971 kg)  BMI 39.74 kg/m2 General: Patient is a well appearing female in no acute distress. Alert and oriented x3. HEENT: PERRLA, sclerae anicteric no conjunctival pallor, MMM Neck: supple, no palpable adenopathy Lungs: clear to auscultation bilaterally, no wheezes, rhonchi, or rales Cardiovascular: regular rate rhythm, S1, S2, no murmurs, rubs or gallops Abdomen: Soft, non-tender, non-distended, normoactive bowel sounds, no HSM. Obese. Extremities: warm and well perfused, no clubbing, cyanosis, or edema Skin: No rashes or lesions Neuro: Non-focal Bilateral breasts are examined. Left breast reveals a well-healed surgical scar in the upper outer quadrant. There is no nipple retraction no masses no nipple notes other skin changes other than darkening of the skin secondary to radiation therapy. Right breast no masses nipple discharge or retraction ECOG PERFORMANCE STATUS: 0 - Asymptomatic  LABORATORY DATA: Lab Results  Component Value Date   WBC 6.1 05/25/2014   HGB 12.3 05/25/2014   HCT 37.1 05/25/2014   MCV 94.6 05/25/2014   PLT 269 05/25/2014      Chemistry      Component Value Date/Time   NA 139 05/25/2014 1229   NA 138 06/21/2013 1116   K 3.4* 05/25/2014 1229   K 4.0 06/21/2013 1116   CL 102 06/21/2013 1116   CL 104 05/01/2013 1203   CO2 23 05/25/2014 1229   CO2 26 06/21/2013 1116   BUN 15.8 05/25/2014 1229   BUN 10 06/21/2013 1116   CREATININE 0.8 05/25/2014 1229   CREATININE 0.7 06/21/2013 1116      Component Value Date/Time   CALCIUM 9.6 05/25/2014 1229   CALCIUM 9.8 06/21/2013 1116   ALKPHOS 81 05/25/2014 1229   ALKPHOS 68 06/21/2013 1116   AST 11 05/25/2014 1229   AST 18 06/21/2013 1116   ALT 10  05/25/2014 1229   ALT 16 06/21/2013 1116   BILITOT 0.29 05/25/2014 1229   BILITOT 0.3 06/21/2013 1116     IMPRESSION/REPORT/PLAN: 1) Stage II LEFT breast invasive lobular carcinoma diagnosed December 2011 status post lumpectomy and sentinel lymph node biopsy, 1.2 cm invasive lobular carcinoma, with micrometastatic disease within one sentinel lymph node, grade 3, ER positive PR positive, HER-2/neu negative, status post chemotherapy, status post radiation therapy, on tamoxifen since November 2012.  Hot flashes, of late, has been quite intolerable. We discussed the options of switching over to an aromatase inhibitor if she were menopausal. Side effects of an aromatase inhibitor including but not limited to arthralgias, myalgias, risks of increased bone thinning discussed. Meanwhile we shall hope to manage her hot flashes by increasing her Effexor dose. See below. We shall plan on having her  followup in about 6 months with pre-visit FSH, and estradiol levels to assess for menopausal state. We will also perform a baseline bone density scan prior to visit.  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  3) Hot flashes We discussed options of increasing her Effexor to 150 mg a day.  Multiple questions answered.  Time 28 minutes, with more than 50% spent on counseling.  Dr. Doristine Church  05/25/2014, 1:55 PM

## 2014-05-29 ENCOUNTER — Telehealth: Payer: Self-pay | Admitting: Internal Medicine

## 2014-05-29 NOTE — Telephone Encounter (Signed)
, °

## 2014-06-13 ENCOUNTER — Other Ambulatory Visit: Payer: Self-pay | Admitting: Family Medicine

## 2014-06-13 NOTE — Telephone Encounter (Signed)
Last OV 02-09-14 Med filled 04-24-14  Moderate risk

## 2014-06-13 NOTE — Telephone Encounter (Signed)
Med filled.  

## 2014-06-20 ENCOUNTER — Other Ambulatory Visit: Payer: Self-pay | Admitting: Family Medicine

## 2014-06-20 ENCOUNTER — Telehealth: Payer: Self-pay | Admitting: *Deleted

## 2014-06-20 NOTE — Telephone Encounter (Signed)
Med already filled

## 2014-06-20 NOTE — Telephone Encounter (Signed)
Caller name:  Annitta Relation to pt:  self Call back number: [phone]  Pharmacy:  [place]  Reason for call:   Pt called and states [facility] not [facility] zolpidem (AMBIEN) 10 MG tablet.    Per chart, med was filled 06/13/2014.  Please refax or call pharmacy.  bw

## 2014-06-20 NOTE — Telephone Encounter (Signed)
I just called and reordered it at 2:20pm

## 2014-07-05 ENCOUNTER — Other Ambulatory Visit: Payer: Self-pay | Admitting: Oncology

## 2014-08-08 ENCOUNTER — Other Ambulatory Visit: Payer: Self-pay | Admitting: Family Medicine

## 2014-08-08 NOTE — Telephone Encounter (Signed)
Last OV 02-09-14 Phentermine last filled 02-09-14 #30 with 2 Zolpidem last filled 06-13-14 #30 with 1

## 2014-08-09 NOTE — Telephone Encounter (Signed)
Med filled and faxed.  

## 2014-08-09 NOTE — Telephone Encounter (Signed)
Pt notified rx available at front desk

## 2014-08-17 ENCOUNTER — Other Ambulatory Visit: Payer: Self-pay | Admitting: *Deleted

## 2014-08-17 DIAGNOSIS — C50912 Malignant neoplasm of unspecified site of left female breast: Secondary | ICD-10-CM

## 2014-08-17 MED ORDER — VENLAFAXINE HCL ER 75 MG PO CP24: ORAL_CAPSULE | ORAL | Status: DC

## 2014-10-08 ENCOUNTER — Ambulatory Visit: Payer: BC Managed Care – PPO | Admitting: Family Medicine

## 2014-10-10 ENCOUNTER — Encounter: Payer: Self-pay | Admitting: Family Medicine

## 2014-10-10 ENCOUNTER — Other Ambulatory Visit: Payer: Self-pay | Admitting: General Practice

## 2014-10-10 ENCOUNTER — Ambulatory Visit (INDEPENDENT_AMBULATORY_CARE_PROVIDER_SITE_OTHER): Payer: BC Managed Care – PPO | Admitting: Family Medicine

## 2014-10-10 VITALS — BP 120/80 | HR 82 | Temp 98.2°F | Resp 16 | Wt 219.1 lb

## 2014-10-10 DIAGNOSIS — F329 Major depressive disorder, single episode, unspecified: Secondary | ICD-10-CM

## 2014-10-10 DIAGNOSIS — M6248 Contracture of muscle, other site: Secondary | ICD-10-CM

## 2014-10-10 DIAGNOSIS — F419 Anxiety disorder, unspecified: Secondary | ICD-10-CM

## 2014-10-10 DIAGNOSIS — R1013 Epigastric pain: Secondary | ICD-10-CM

## 2014-10-10 DIAGNOSIS — F32A Depression, unspecified: Secondary | ICD-10-CM

## 2014-10-10 DIAGNOSIS — F418 Other specified anxiety disorders: Secondary | ICD-10-CM

## 2014-10-10 DIAGNOSIS — M62838 Other muscle spasm: Secondary | ICD-10-CM | POA: Insufficient documentation

## 2014-10-10 MED ORDER — DICYCLOMINE HCL 20 MG PO TABS: 20.0000 mg | ORAL_TABLET | Freq: Three times a day (TID) | ORAL | Status: DC

## 2014-10-10 MED ORDER — PHENTERMINE HCL 37.5 MG PO TABS: ORAL_TABLET | ORAL | Status: DC

## 2014-10-10 MED ORDER — VENLAFAXINE HCL ER 150 MG PO TB24: 1.0000 | ORAL_TABLET | Freq: Every day | ORAL | Status: DC

## 2014-10-10 MED ORDER — CYCLOBENZAPRINE HCL 5 MG PO TABS: 5.0000 mg | ORAL_TABLET | Freq: Three times a day (TID) | ORAL | Status: DC | PRN

## 2014-10-10 MED ORDER — ZOLPIDEM TARTRATE 10 MG PO TABS: ORAL_TABLET | ORAL | Status: DC

## 2014-10-10 NOTE — Patient Instructions (Signed)
Follow up in 6 weeks to recheck anxiety and abdominal pain We'll notify you of your lab results and make any changes if needed Start the Bentyl as needed for abdominal spasm Use the Flexeril as needed for neck pain/spasm HEAT the neck and shoulders Increase the Effexor to 150mg - 2 of what you have at home and 1 of your new prescription Call with any questions or concerns Hang in there!!

## 2014-10-10 NOTE — Progress Notes (Signed)
Pre visit review using our clinic review tool, if applicable. No additional management support is needed unless otherwise documented below in the visit note. 

## 2014-10-10 NOTE — Progress Notes (Signed)
   Subjective:    Patient ID: [name], female    DOB: 10/09/1961, 53 y.o.   MRN: [id number]  HPI Neck pain- pain in neck and radiating to shoulders.  Pt thinks it's stress related.  + spasm.  abd pain- started during chemo.  Went to hospital for 3-4 days.  Pain is epigastric and described as a 'strong abdominal spasm'.  Pain causes pt to double over in pain.  Pain is intermittent and may not recur for weeks/months.  + nausea.  No vomiting.  No diarrhea.  Anxiety- 'my levels are all over the place'.  On Effexor 75mg  daily.  Had discussed increasing dose w/ oncologist before she left but this never happened   Review of Systems For ROS see HPI     Objective:   Physical Exam  Vitals reviewed. Constitutional: She is oriented to person, place, and time. She appears well-developed and well-nourished. No distress.  HENT:  Head: Normocephalic and atraumatic.  Neck: Normal range of motion. Neck supple.  Bilateral trap spasm  Cardiovascular: Normal rate, regular rhythm, normal heart sounds and intact distal pulses.   Pulmonary/Chest: Effort normal and breath sounds normal. No respiratory distress. She has no wheezes. She has no rales.  Abdominal: Soft. Bowel sounds are normal. She exhibits no distension. There is no tenderness. There is no rebound and no guarding.  Musculoskeletal: She exhibits no edema.  Lymphadenopathy:    She has no cervical adenopathy.  Neurological: She is alert and oriented to person, place, and time.  Skin: Skin is warm and dry.  Psychiatric: She has a normal mood and affect. Her behavior is normal. Thought content normal.          Assessment & Plan:

## 2014-10-11 LAB — CBC WITH DIFFERENTIAL/PLATELET
BASOS PCT: 0.8 % (ref 0.0–3.0)
Basophils Absolute: 0.1 10*3/uL (ref 0.0–0.1)
Eosinophils Absolute: 0.1 10*3/uL (ref 0.0–0.7)
Eosinophils Relative: 1.2 % (ref 0.0–5.0)
HCT: 40.6 % (ref 36.0–46.0)
Hemoglobin: 13.4 g/dL (ref 12.0–15.0)
LYMPHS PCT: 36.5 % (ref 12.0–46.0)
Lymphs Abs: 2.5 10*3/uL (ref 0.7–4.0)
MCHC: 32.9 g/dL (ref 30.0–36.0)
MCV: 94.4 fl (ref 78.0–100.0)
MONOS PCT: 6.5 % (ref 3.0–12.0)
Monocytes Absolute: 0.5 10*3/uL (ref 0.1–1.0)
Neutro Abs: 3.8 10*3/uL (ref 1.4–7.7)
Neutrophils Relative %: 55 % (ref 43.0–77.0)
Platelets: 325 10*3/uL (ref 150.0–400.0)
RBC: 4.3 Mil/uL (ref 3.87–5.11)
RDW: 12.9 % (ref 11.5–15.5)
WBC: 6.9 10*3/uL (ref 4.0–10.5)

## 2014-10-11 LAB — BASIC METABOLIC PANEL
BUN: 17 mg/dL (ref 6–23)
CALCIUM: 9.9 mg/dL (ref 8.4–10.5)
CO2: 32 meq/L (ref 19–32)
CREATININE: 0.9 mg/dL (ref 0.4–1.2)
Chloride: 98 mEq/L (ref 96–112)
GFR: 85.55 mL/min (ref >60.00)
Glucose, Bld: 72 mg/dL (ref 70–99)
Potassium: 4.1 mEq/L (ref 3.5–5.1)
Sodium: 136 mEq/L (ref 135–145)

## 2014-10-11 LAB — HEPATIC FUNCTION PANEL
ALBUMIN: 3.7 g/dL (ref 3.5–5.2)
ALT: 16 U/L (ref 0–35)
AST: 16 U/L (ref 0–37)
Alkaline Phosphatase: 72 U/L (ref 39–117)
BILIRUBIN DIRECT: 0 mg/dL (ref 0.0–0.3)
TOTAL PROTEIN: 8.4 g/dL — AB (ref 6.0–8.3)
Total Bilirubin: 0.3 mg/dL (ref 0.2–1.2)

## 2014-10-11 LAB — H. PYLORI ANTIBODY, IGG: H Pylori IgG: NEGATIVE

## 2014-10-12 ENCOUNTER — Encounter: Payer: Self-pay | Admitting: General Practice

## 2014-10-14 NOTE — Assessment & Plan Note (Signed)
New.  Suspect based on her description that she is having colonic spasms consistent w/ IBS.  Check labs to r/o underlying causes.  Start Bentyl prn.  Reviewed supportive care and red flags that should prompt return.  Pt expressed understanding and is in agreement w/ plan.

## 2014-10-14 NOTE — Assessment & Plan Note (Signed)
Deteriorated.  Increase Effexor.  Monitor for improvement.

## 2014-10-14 NOTE — Assessment & Plan Note (Signed)
New.  Suspect this is stress related.  Start flexeril prn.  Heat.  Massage.  Reviewed supportive care and red flags that should prompt return.  Pt expressed understanding and is in agreement w/ plan.

## 2014-10-17 ENCOUNTER — Other Ambulatory Visit: Payer: Self-pay | Admitting: General Practice

## 2014-10-17 MED ORDER — VENLAFAXINE HCL ER 75 MG PO TB24
2.0000 | ORAL_TABLET | Freq: Every day | ORAL | Status: DC
Start: 2014-10-17 — End: 2015-01-28

## 2014-11-19 ENCOUNTER — Other Ambulatory Visit: Payer: BC Managed Care – PPO

## 2014-11-19 ENCOUNTER — Ambulatory Visit
Admission: RE | Admit: 2014-11-19 | Discharge: 2014-11-19 | Disposition: A | Discharge status: Home / Self Care | Payer: BC Managed Care – PPO | Source: Ambulatory Visit | Attending: Internal Medicine | Admitting: Internal Medicine

## 2014-11-19 ENCOUNTER — Other Ambulatory Visit (HOSPITAL_BASED_OUTPATIENT_CLINIC_OR_DEPARTMENT_OTHER): Payer: BC Managed Care – PPO

## 2014-11-19 ENCOUNTER — Other Ambulatory Visit: Payer: Self-pay | Admitting: General Practice

## 2014-11-19 DIAGNOSIS — C50919 Malignant neoplasm of unspecified site of unspecified female breast: Secondary | ICD-10-CM

## 2014-11-19 LAB — CBC WITH DIFFERENTIAL/PLATELET
BASO%: 0.4 % (ref 0.0–2.0)
BASOS ABS: 0 10*3/uL (ref 0.0–0.1)
EOS ABS: 0.1 10*3/uL (ref 0.0–0.5)
EOS%: 2.3 % (ref 0.0–7.0)
HCT: 38.9 % (ref 34.8–46.6)
HEMOGLOBIN: 13.1 g/dL (ref 11.6–15.9)
LYMPH%: 40.5 % (ref 14.0–49.7)
MCH: 31 pg (ref 25.1–34.0)
MCHC: 33.7 g/dL (ref 31.5–36.0)
MCV: 92.2 fL (ref 79.5–101.0)
MONO#: 0.3 10*3/uL (ref 0.1–0.9)
MONO%: 6.4 % (ref 0.0–14.0)
NEUT%: 50.4 % (ref 38.4–76.8)
NEUTROS ABS: 2.7 10*3/uL (ref 1.5–6.5)
PLATELETS: 255 10*3/uL (ref 145–400)
RBC: 4.22 10*6/uL (ref 3.70–5.45)
RDW: 12.4 % (ref 11.2–14.5)
WBC: 5.3 10*3/uL (ref 3.9–10.3)
lymph#: 2.2 10*3/uL (ref 0.9–3.3)

## 2014-11-19 LAB — COMPREHENSIVE METABOLIC PANEL (CC13)
ALBUMIN: 3.7 g/dL (ref 3.5–5.0)
ALT: 11 U/L (ref 0–55)
ANION GAP: 9 meq/L (ref 3–11)
AST: 14 U/L (ref 5–34)
Alkaline Phosphatase: 89 U/L (ref 40–150)
BILIRUBIN TOTAL: 0.29 mg/dL (ref 0.20–1.20)
BUN: 12.8 mg/dL (ref 7.0–26.0)
CO2: 28 meq/L (ref 22–29)
Calcium: 10 mg/dL (ref 8.4–10.4)
Chloride: 103 mEq/L (ref 98–109)
Creatinine: 0.8 mg/dL (ref 0.6–1.1)
GLUCOSE: 111 mg/dL (ref 70–140)
Potassium: 3.7 mEq/L (ref 3.5–5.1)
Sodium: 140 mEq/L (ref 136–145)
TOTAL PROTEIN: 7.6 g/dL (ref 6.4–8.3)

## 2014-11-19 MED ORDER — ATENOLOL-CHLORTHALIDONE 50-25 MG PO TABS: 0.5000 | ORAL_TABLET | Freq: Every day | ORAL | Status: DC

## 2014-11-20 LAB — FOLLICLE STIMULATING HORMONE: FSH: 19 m[IU]/mL

## 2014-11-21 ENCOUNTER — Encounter: Payer: Self-pay | Admitting: Family Medicine

## 2014-11-21 ENCOUNTER — Ambulatory Visit (INDEPENDENT_AMBULATORY_CARE_PROVIDER_SITE_OTHER): Payer: BC Managed Care – PPO | Admitting: Family Medicine

## 2014-11-21 VITALS — BP 128/86 | HR 86 | Temp 97.5°F | Resp 16 | Ht 62.75 in | Wt 228.0 lb

## 2014-11-21 DIAGNOSIS — F32A Depression, unspecified: Secondary | ICD-10-CM

## 2014-11-21 DIAGNOSIS — R1013 Epigastric pain: Secondary | ICD-10-CM

## 2014-11-21 DIAGNOSIS — F418 Other specified anxiety disorders: Secondary | ICD-10-CM

## 2014-11-21 DIAGNOSIS — F419 Anxiety disorder, unspecified: Principal | ICD-10-CM

## 2014-11-21 DIAGNOSIS — F329 Major depressive disorder, single episode, unspecified: Secondary | ICD-10-CM

## 2014-11-21 MED ORDER — CYCLOBENZAPRINE HCL 5 MG PO TABS: 5.0000 mg | ORAL_TABLET | Freq: Every day | ORAL | Status: DC

## 2014-11-21 NOTE — Progress Notes (Signed)
Pre visit review using our clinic review tool, if applicable. No additional management support is needed unless otherwise documented below in the visit note. 

## 2014-11-21 NOTE — Progress Notes (Signed)
   Subjective:    Patient ID: [name], female    DOB: Oct 05, 1961, 53 y.o.   MRN: [id number]  HPI Anxiety/depression- 'i feel much better' since increasing Effexor.  Less down, sleeping better.  More relaxed, less wound.  Abd pain- much better.  Used bentyl for 1st few weeks and then weaned.  Not having any cramping, nausea, pain.   Review of Systems For ROS see HPI     Objective:   Physical Exam  Constitutional: She is oriented to person, place, and time. She appears well-developed and well-nourished. No distress.  HENT:  Head: Normocephalic and atraumatic.  Cardiovascular: Normal rate, regular rhythm, normal heart sounds and intact distal pulses.   Pulmonary/Chest: Effort normal and breath sounds normal. No respiratory distress. She has no wheezes. She has no rales.  Neurological: She is alert and oriented to person, place, and time.  Skin: Skin is warm and dry.  Psychiatric: She has a normal mood and affect. Her behavior is normal. Thought content normal.  Vitals reviewed.         Assessment & Plan:

## 2014-11-21 NOTE — Patient Instructions (Signed)
Schedule your complete physical in 6 months Continue the Effexor daily Use the Bentyl as needed for abdominal spasm Keep up the good work!  You look great!! Call with any questions or concerns Happy Holidays!!!

## 2014-11-21 NOTE — Assessment & Plan Note (Signed)
Improved.  Pt doing better since increasing her Effexor.  Mood has improved, anxiety has improved, sleep has improved.  No need for med changes at this time but will continue to follow.  Pt expressed understanding and is in agreement w/ plan.

## 2014-11-21 NOTE — Assessment & Plan Note (Signed)
Resolved.  Pt reports good relief w/ bentyl use prn.  No longer requiring regularly.  Pt to use prn.

## 2014-11-23 LAB — ESTRADIOL, ULTRA SENS: ESTRADIOL, ULTRA SENSITIVE: 10 pg/mL

## 2014-11-26 ENCOUNTER — Telehealth: Payer: Self-pay | Admitting: Hematology and Oncology

## 2014-11-26 ENCOUNTER — Ambulatory Visit (HOSPITAL_BASED_OUTPATIENT_CLINIC_OR_DEPARTMENT_OTHER): Payer: BC Managed Care – PPO | Admitting: Hematology and Oncology

## 2014-11-26 VITALS — BP 146/85 | HR 110 | Temp 98.2°F | Resp 19 | Ht 62.75 in | Wt 234.0 lb

## 2014-11-26 DIAGNOSIS — C50512 Malignant neoplasm of lower-outer quadrant of left female breast: Secondary | ICD-10-CM

## 2014-11-26 DIAGNOSIS — C50212 Malignant neoplasm of upper-inner quadrant of left female breast: Secondary | ICD-10-CM

## 2014-11-26 DIAGNOSIS — N951 Menopausal and female climacteric states: Secondary | ICD-10-CM

## 2014-11-26 NOTE — Telephone Encounter (Signed)
gv pt appt schedule for may 2016.  °

## 2014-11-26 NOTE — Progress Notes (Signed)
Patient Care Team: Midge Minium, MD as PCP - General  DIAGNOSIS: No matching staging information was found for the patient.  SUMMARY OF ONCOLOGIC HISTORY:   Breast cancer of lower-outer quadrant of left female breast   01/20/2011 Surgery Left breast lumpectomy: Invasive lobular carcinoma multiple foci 1.2 and 0.7 cm posterior margin and LCIS, micro met in one SLN, ER 100%, PR 100%, Ki-67 12%, HER-2 negative ratio 1.11: Oncotype DX 22, ROR14%   03/17/2011 - 07/17/2011 Chemotherapy Adjuvant chemotherapy AC x4 followed by Taxol weekly x8   08/28/2011 - 10/13/2011 Radiation Therapy Adjuvant radiation therapy   10/29/2011 -  Anti-estrogen oral therapy Adjuvant tamoxifen 20 mg daily plan is for 10 years    CHIEF COMPLIANT: followup on tamoxifen for breast cancer  INTERVAL HISTORY: [name] is a 53 year old white lady with above-mentioned history of breast cancer who is here for a six-month followup. She reports no new problems or concerns. She's been taking tamoxifen which causes hot flashes. She started to take it in the daytime and her hot flashes have slightly improved. She is on Effexor for hot flashes. She has previously tried acupuncture as well as yoga.  REVIEW OF SYSTEMS:   Constitutional: Denies fevers, chills or abnormal weight loss Eyes: Denies blurriness of vision Ears, nose, mouth, throat, and face: Denies mucositis or sore throat Respiratory: Denies cough, dyspnea or wheezes Cardiovascular: Denies palpitation, chest discomfort or lower extremity swelling Gastrointestinal:  Denies nausea, heartburn or change in bowel habits Skin: Denies abnormal skin rashes Lymphatics: Denies new lymphadenopathy or easy bruising Neurological:Denies numbness, tingling or new weaknesses Behavioral/Psych: Mood is stable, no new changes  Breast:  denies any pain or lumps or nodules in either breasts other than scar tissue from prior surgery and radiation All other systems were reviewed with the  patient and are negative.  I have reviewed the past medical history, past surgical history, social history and family history with the patient and they are unchanged from previous note.  ALLERGIES:  has No Known Allergies.  MEDICATIONS:  Current Outpatient Prescriptions  Medication Sig Dispense Refill  . atenolol-chlorthalidone (TENORETIC) 50-25 MG per tablet Take 0.5 tablets by mouth daily. 12 tablet 5  . cyclobenzaprine (FLEXERIL) 5 MG tablet Take 1 tablet (5 mg total) by mouth at bedtime. 30 tablet 1  . dicyclomine (BENTYL) 20 MG tablet Take 1 tablet (20 mg total) by mouth 3 (three) times daily before meals. 60 tablet 1  . furosemide (LASIX) 40 MG tablet Take 1 tablet (40 mg total) by mouth daily as needed. 30 tablet 5  . gabapentin (NEURONTIN) 300 MG capsule Take 300 mg by mouth 4 (four) times daily. As needed    . phentermine (ADIPEX-P) 37.5 MG tablet TAKE 1 TABLET BY MOUTH DAILY 30 tablet 2  . tamoxifen (NOLVADEX) 20 MG tablet TAKE 1 TABLET (20 MG TOTAL) BY MOUTH DAILY. 90 tablet 1  . Venlafaxine HCl 75 MG TB24 Take 2 tablets (150 mg total) by mouth daily. 60 each 6  . zolpidem (AMBIEN) 10 MG tablet TAKE 1 TABLET AT BEDTIME AS NEEDED 30 tablet 2   No current facility-administered medications for this visit.    PHYSICAL EXAMINATION: ECOG PERFORMANCE STATUS: 0 - Asymptomatic  Filed Vitals:   11/26/14 1045  BP: 146/85  Pulse: 110  Temp: 98.2 F (36.8 C)  Resp: 19   Filed Weights   11/26/14 1045  Weight: 234 lb (106.142 kg)    GENERAL:alert, no distress and comfortable SKIN: skin color, texture,  turgor are normal, no rashes or significant lesions EYES: normal, Conjunctiva are pink and non-injected, sclera clear OROPHARYNX:no exudate, no erythema and lips, buccal mucosa, and tongue normal  NECK: supple, thyroid normal size, non-tender, without nodularity LYMPH:  no palpable lymphadenopathy in the cervical, axillary or inguinal LUNGS: clear to auscultation and percussion  with normal breathing effort HEART: regular rate & rhythm and no murmurs and no lower extremity edema ABDOMEN:abdomen soft, non-tender and normal bowel sounds Musculoskeletal:no cyanosis of digits and no clubbing  NEURO: alert & oriented x 3 with fluent speech, no focal motor/sensory deficits BREAST: No palpable masses or nodules in either right or left breasts. Scar tissue from prior surgery and radiation the left breast No palpable axillary supraclavicular or infraclavicular adenopathy no breast tenderness or nipple discharge.   LABORATORY DATA:  I have reviewed the data as listed   Chemistry      Component Value Date/Time   NA 140 11/19/2014 1232   NA 136 10/10/2014 1351   K 3.7 11/19/2014 1232   K 4.1 10/10/2014 1351   CL 98 10/10/2014 1351   CL 104 05/01/2013 1203   CO2 28 11/19/2014 1232   CO2 32 10/10/2014 1351   BUN 12.8 11/19/2014 1232   BUN 17 10/10/2014 1351   CREATININE 0.8 11/19/2014 1232   CREATININE 0.9 10/10/2014 1351      Component Value Date/Time   CALCIUM 10.0 11/19/2014 1232   CALCIUM 9.9 10/10/2014 1351   ALKPHOS 89 11/19/2014 1232   ALKPHOS 72 10/10/2014 1351   AST 14 11/19/2014 1232   AST 16 10/10/2014 1351   ALT 11 11/19/2014 1232   ALT 16 10/10/2014 1351   BILITOT 0.29 11/19/2014 1232   BILITOT 0.3 10/10/2014 1351       Lab Results  Component Value Date   WBC 5.3 11/19/2014   HGB 13.1 11/19/2014   HCT 38.9 11/19/2014   MCV 92.2 11/19/2014   PLT 255 11/19/2014   NEUTROABS 2.7 11/19/2014     RADIOGRAPHIC STUDIES: Mammograms in January 2015 were normal  ASSESSMENT & PLAN:  Breast cancer of lower-outer quadrant of left female breast Left breast cancer stage II invasive lobular with one positive lymph node for micrometastatic disease status post lumpectomy followed by adjuvant chemotherapy with a.c. x4 followed by Taxol x8 and status post radiation therapy. Currently on tamoxifen 20 mg daily started in 2012.  Tamoxifen toxicities: Patient  is experiencing the following toxicities of tamoxifen 1. Hot flashes being treated with Effexor 75 mg daily 2. Vaginal dryness I discussed with her that 10 years of tamoxifen and the current standard of care. Compared to 5 years 10 years does decrease of relapse rate. We could consider doing BCI or breast cancer index to assess if she needs 10 years of antiestrogen therapy when it comes to year 5  Survivorship: Discussed the importance of physical exercise in decreasing the likelihood of breast cancer recurrence. Recommended 30 mins daily 6 days a week of either brisk walking or cycling or swimming. Encouraged patient to eat more fruits and vegetables and decrease red meat.   Surveillance: Today's breast exam was normal except for scar tissue related to prior surgery and radiation in the left breast. Mammograms in January 2015 were also normal.  Return to clinic in 6 months for followup.   No orders of the defined types were placed in this encounter.   The patient has a good understanding of the overall plan. she agrees with it. She will call with  any problems that may develop before her next visit here.   Rulon Eisenmenger, MD 11/26/2014 11:25 AM

## 2014-11-26 NOTE — Assessment & Plan Note (Addendum)
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 x4 followed by Taxol x8 and status post radiation therapy. Currently on tamoxifen 20 mg daily started in 2012.  Tamoxifen toxicities: Patient is experiencing the following toxicities of tamoxifen 1. Hot flashes being treated with Effexor 75 mg daily 2. Vaginal dryness  Survivorship: Discussed the importance of physical exercise in decreasing the likelihood of breast cancer recurrence. Recommended 30 mins daily 6 days a week of either brisk walking or cycling or swimming. Encouraged patient to eat more fruits and vegetables and decrease red meat.   Surveillance: The specimens that was normal. Mammograms in January 2015 were also normal.  Return to clinic in 6 months for followup.

## 2014-11-29 ENCOUNTER — Telehealth: Payer: Self-pay

## 2014-11-29 NOTE — Telephone Encounter (Signed)
Bone density results rcvd from the breast center drd 11/19/14,  Copy to Dr Lindi Adie. Original to scan

## 2014-12-03 ENCOUNTER — Other Ambulatory Visit: Payer: Self-pay | Admitting: Family Medicine

## 2014-12-03 NOTE — Telephone Encounter (Signed)
Med filled.  

## 2015-01-04 ENCOUNTER — Other Ambulatory Visit: Payer: Self-pay | Admitting: Hematology and Oncology

## 2015-01-04 DIAGNOSIS — Z853 Personal history of malignant neoplasm of breast: Secondary | ICD-10-CM

## 2015-01-14 ENCOUNTER — Ambulatory Visit
Admission: RE | Admit: 2015-01-14 | Discharge: 2015-01-14 | Disposition: A | Discharge status: Home / Self Care | Payer: BLUE CROSS/BLUE SHIELD | Source: Ambulatory Visit | Attending: Hematology and Oncology | Admitting: Hematology and Oncology

## 2015-01-14 DIAGNOSIS — Z853 Personal history of malignant neoplasm of breast: Secondary | ICD-10-CM

## 2015-01-17 ENCOUNTER — Telehealth: Payer: Self-pay | Admitting: Family Medicine

## 2015-01-17 MED ORDER — ZOLPIDEM TARTRATE 10 MG PO TABS: ORAL_TABLET | ORAL | Status: DC

## 2015-01-17 NOTE — Telephone Encounter (Signed)
Caller name: Sonali Relation to pt: self Call back number: [phone] [facility]  Reason for call:   Patient requesting ambien refill

## 2015-01-17 NOTE — Telephone Encounter (Signed)
Last OV 11-26-14 ambien last filled 10/10/14 #30 with 2

## 2015-01-17 NOTE — Telephone Encounter (Signed)
Ok for #30, 3 refills 

## 2015-01-17 NOTE — Telephone Encounter (Signed)
Med filled.  

## 2015-01-28 ENCOUNTER — Other Ambulatory Visit: Payer: Self-pay | Admitting: *Deleted

## 2015-01-28 DIAGNOSIS — C50512 Malignant neoplasm of lower-outer quadrant of left female breast: Secondary | ICD-10-CM

## 2015-01-28 MED ORDER — VENLAFAXINE HCL ER 75 MG PO TB24
2.0000 | ORAL_TABLET | Freq: Every day | ORAL | Status: DC
Start: 2015-01-28 — End: 2015-02-01

## 2015-01-29 ENCOUNTER — Other Ambulatory Visit: Payer: Self-pay | Admitting: Family Medicine

## 2015-01-29 NOTE — Telephone Encounter (Signed)
Caller name: Artisha Relation to pt: self Call back number:  [phone] Pharmacy:  Reason for call:   Patient states that she is supposed to be taking 75mg  twice daily and states that her oncologist filled venafalaxine. Now her insurance is not paying for this. She states that Dr. Birdie Riddle originally prescribed and is supposed to be prescribing this. Patient states that she will run out soon.

## 2015-01-31 NOTE — Telephone Encounter (Signed)
Quantity limit exception paperwork submitted to [place] [facility] [place]. Awaiting determination. JG//CMA

## 2015-02-01 ENCOUNTER — Telehealth: Payer: Self-pay | Admitting: Family Medicine

## 2015-02-01 MED ORDER — VENLAFAXINE HCL ER 150 MG PO TB24: 1.0000 | ORAL_TABLET | Freq: Every day | ORAL | Status: DC

## 2015-02-01 NOTE — Telephone Encounter (Signed)
Caller name: [name], [name] Relation to pt: self  Call back number: [phone] Pharmacy:  Reason for call:  According to [facility] will not cover 2 pills a day. Insurance will cover increase in strength. Please advise. Pt states she is completely out of medication

## 2015-02-01 NOTE — Telephone Encounter (Signed)
Med filled.  

## 2015-02-01 NOTE — Telephone Encounter (Signed)
2 pills of what? I cannot do anything without a medication?

## 2015-02-01 NOTE — Telephone Encounter (Signed)
Venlafaxine HCl 75 MG TB24

## 2015-04-24 ENCOUNTER — Other Ambulatory Visit: Payer: Self-pay | Admitting: Family Medicine

## 2015-04-25 NOTE — Telephone Encounter (Signed)
Last OV 11/21/14 Phentermine last filled 10/10/14 #30 with 2

## 2015-04-25 NOTE — Telephone Encounter (Signed)
Med filled and fixed.

## 2015-05-01 ENCOUNTER — Telehealth: Payer: Self-pay | Admitting: Family Medicine

## 2015-05-01 NOTE — Telephone Encounter (Signed)
Pre Visit letter sent  °

## 2015-05-15 ENCOUNTER — Encounter: Payer: Self-pay | Admitting: Gastroenterology

## 2015-05-21 ENCOUNTER — Telehealth: Payer: Self-pay | Admitting: *Deleted

## 2015-05-21 NOTE — Telephone Encounter (Signed)
Unable to reach patient at time of Pre-Visit Call.  Left message for patient to return call when available.    

## 2015-05-22 ENCOUNTER — Ambulatory Visit (INDEPENDENT_AMBULATORY_CARE_PROVIDER_SITE_OTHER): Payer: BLUE CROSS/BLUE SHIELD | Admitting: Family Medicine

## 2015-05-22 ENCOUNTER — Encounter: Payer: Self-pay | Admitting: Family Medicine

## 2015-05-22 VITALS — BP 130/80 | HR 77 | Temp 98.0°F | Resp 16 | Wt 230.0 lb

## 2015-05-22 DIAGNOSIS — Z0000 Encounter for general adult medical examination without abnormal findings: Secondary | ICD-10-CM

## 2015-05-22 LAB — CBC WITH DIFFERENTIAL/PLATELET
BASOS PCT: 0.6 % (ref 0.0–3.0)
Basophils Absolute: 0 10*3/uL (ref 0.0–0.1)
Eosinophils Absolute: 0.1 10*3/uL (ref 0.0–0.7)
Eosinophils Relative: 2.2 % (ref 0.0–5.0)
HCT: 37.3 % (ref 36.0–46.0)
Hemoglobin: 12.8 g/dL (ref 12.0–15.0)
LYMPHS PCT: 44 % (ref 12.0–46.0)
Lymphs Abs: 2.2 10*3/uL (ref 0.7–4.0)
MCHC: 34.3 g/dL (ref 30.0–36.0)
MCV: 91.1 fl (ref 78.0–100.0)
MONO ABS: 0.3 10*3/uL (ref 0.1–1.0)
MONOS PCT: 6.3 % (ref 3.0–12.0)
NEUTROS ABS: 2.4 10*3/uL (ref 1.4–7.7)
Neutrophils Relative %: 46.9 % (ref 43.0–77.0)
PLATELETS: 339 10*3/uL (ref 150.0–400.0)
RBC: 4.09 Mil/uL (ref 3.87–5.11)
RDW: 13.2 % (ref 11.5–15.5)
WBC: 5.1 10*3/uL (ref 4.0–10.5)

## 2015-05-22 MED ORDER — DICYCLOMINE HCL 20 MG PO TABS: ORAL_TABLET | ORAL | Status: DC

## 2015-05-22 MED ORDER — VENLAFAXINE HCL ER 150 MG PO TB24: 1.0000 | ORAL_TABLET | Freq: Every day | ORAL | Status: DC

## 2015-05-22 MED ORDER — ATENOLOL-CHLORTHALIDONE 50-25 MG PO TABS: 0.5000 | ORAL_TABLET | Freq: Every day | ORAL | Status: DC

## 2015-05-22 MED ORDER — ZOLPIDEM TARTRATE 10 MG PO TABS: ORAL_TABLET | ORAL | Status: DC

## 2015-05-22 NOTE — Progress Notes (Signed)
   Subjective:    Patient ID: [name], female    DOB: [birth year], 54 y.o.   MRN: [id number]  HPI CPE- UTD on mammo, colonoscopy.  S/p hysterectomy- due for appt w/ Dr Garwin Brothers   Review of Systems Patient reports no vision/ hearing changes, adenopathy,fever, weight change,  persistant/recurrent hoarseness , swallowing issues, chest pain, palpitations, edema, persistant/recurrent cough, hemoptysis, dyspnea (rest/exertional/paroxysmal nocturnal), gastrointestinal bleeding (melena, rectal bleeding), abdominal pain, significant heartburn, bowel changes, GU symptoms (dysuria, hematuria, incontinence), Gyn symptoms (abnormal  bleeding, pain),  syncope, focal weakness, memory loss, skin/hair/nail changes, abnormal bruising or bleeding, anxiety, or depression.   + chemo neuropathy    Objective:   Physical Exam General Appearance:    Alert, cooperative, no distress, appears stated age, obese  Head:    Normocephalic, without obvious abnormality, atraumatic  Eyes:    PERRL, conjunctiva/corneas clear, EOM's intact, fundi    benign, both eyes  Ears:    Normal TM's and external ear canals, both ears  Nose:   Nares normal, septum midline, mucosa normal, no drainage    or sinus tenderness  Throat:   Lips, mucosa, and tongue normal; teeth and gums normal  Neck:   Supple, symmetrical, trachea midline, no adenopathy;    Thyroid: no enlargement/tenderness/nodules  Back:     Symmetric, no curvature, ROM normal, no CVA tenderness  Lungs:     Clear to auscultation bilaterally, respirations unlabored  Chest Wall:    No tenderness or deformity   Heart:    Regular rate and rhythm, S1 and S2 normal, no murmur, rub   or gallop  Breast Exam:    Deferred to GYN  Abdomen:     Soft, non-tender, bowel sounds active all four quadrants,    no masses, no organomegaly  Genitalia:    Deferred to GYN  Rectal:    Extremities:   Extremities normal, atraumatic, no cyanosis or edema  Pulses:   2+ and symmetric all  extremities  Skin:   Skin color, texture, turgor normal, no rashes or lesions  Lymph nodes:   Cervical, supraclavicular, and axillary nodes normal  Neurologic:   CNII-XII intact, normal strength, sensation and reflexes    throughout          Assessment & Plan:

## 2015-05-22 NOTE — Progress Notes (Signed)
Pre visit review using our clinic review tool, if applicable. No additional management support is needed unless otherwise documented below in the visit note. 

## 2015-05-22 NOTE — Patient Instructions (Signed)
Follow up in 6 months to recheck BP We'll notify you of your lab results and make any changes if needed Call and schedule w/ Dr Garwin Brothers Call with any questions or concerns Keep up the good work!  You look great! Try and add regular exercise and healthy food choices Have a great time in the [facility]!!!

## 2015-05-22 NOTE — Assessment & Plan Note (Signed)
Pt's PE WNL w/ exception of obesity.  UTD on mammo, colonoscopy.  Due for GYN exam- pt plans to call.  Check labs.  Anticipatory guidance provided.

## 2015-05-23 LAB — LIPID PANEL
Cholesterol: 219 mg/dL — ABNORMAL HIGH (ref 0–200)
HDL: 46.2 mg/dL (ref >39.00)
LDL Cholesterol: 139 mg/dL — ABNORMAL HIGH (ref 0–99)
NonHDL: 172.8
Total CHOL/HDL Ratio: 5
Triglycerides: 170 mg/dL — ABNORMAL HIGH (ref 0.0–149.0)
VLDL: 34 mg/dL (ref 0.0–40.0)

## 2015-05-23 LAB — HEPATIC FUNCTION PANEL
ALT: 14 U/L (ref 0–35)
AST: 15 U/L (ref 0–37)
Albumin: 4.2 g/dL (ref 3.5–5.2)
Alkaline Phosphatase: 86 U/L (ref 39–117)
Bilirubin, Direct: 0.1 mg/dL (ref 0.0–0.3)
Total Bilirubin: 0.3 mg/dL (ref 0.2–1.2)
Total Protein: 7.4 g/dL (ref 6.0–8.3)

## 2015-05-23 LAB — VITAMIN D 25 HYDROXY (VIT D DEFICIENCY, FRACTURES): VITD: 14.53 ng/mL — ABNORMAL LOW (ref 30.00–100.00)

## 2015-05-23 LAB — TSH: TSH: 1.14 u[IU]/mL (ref 0.35–4.50)

## 2015-05-23 LAB — BASIC METABOLIC PANEL
BUN: 11 mg/dL (ref 6–23)
CHLORIDE: 102 meq/L (ref 96–112)
CO2: 30 meq/L (ref 19–32)
CREATININE: 0.76 mg/dL (ref 0.40–1.20)
Calcium: 9.7 mg/dL (ref 8.4–10.5)
GFR: 102.41 mL/min (ref >60.00)
Glucose, Bld: 93 mg/dL (ref 70–99)
POTASSIUM: 3.8 meq/L (ref 3.5–5.1)
Sodium: 139 mEq/L (ref 135–145)

## 2015-05-24 ENCOUNTER — Other Ambulatory Visit: Payer: Self-pay | Admitting: Family Medicine

## 2015-05-24 NOTE — Telephone Encounter (Signed)
Med filled.  

## 2015-05-28 ENCOUNTER — Telehealth: Payer: Self-pay | Admitting: Hematology and Oncology

## 2015-05-28 ENCOUNTER — Ambulatory Visit (HOSPITAL_BASED_OUTPATIENT_CLINIC_OR_DEPARTMENT_OTHER): Payer: BLUE CROSS/BLUE SHIELD

## 2015-05-28 ENCOUNTER — Ambulatory Visit (HOSPITAL_BASED_OUTPATIENT_CLINIC_OR_DEPARTMENT_OTHER): Payer: BLUE CROSS/BLUE SHIELD | Admitting: Hematology and Oncology

## 2015-05-28 ENCOUNTER — Other Ambulatory Visit: Payer: Self-pay | Admitting: General Practice

## 2015-05-28 DIAGNOSIS — C773 Secondary and unspecified malignant neoplasm of axilla and upper limb lymph nodes: Secondary | ICD-10-CM

## 2015-05-28 DIAGNOSIS — N951 Menopausal and female climacteric states: Secondary | ICD-10-CM

## 2015-05-28 DIAGNOSIS — C50512 Malignant neoplasm of lower-outer quadrant of left female breast: Secondary | ICD-10-CM

## 2015-05-28 DIAGNOSIS — C50212 Malignant neoplasm of upper-inner quadrant of left female breast: Secondary | ICD-10-CM

## 2015-05-28 MED ORDER — VITAMIN D (ERGOCALCIFEROL) 1.25 MG (50000 UNIT) PO CAPS: 50000.0000 [IU] | ORAL_CAPSULE | ORAL | Status: DC

## 2015-05-28 NOTE — Telephone Encounter (Signed)
Appointments made and avs printed for patient °

## 2015-05-28 NOTE — Assessment & Plan Note (Signed)
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 x4 followed by Taxol x8 and status post radiation therapy. Currently on tamoxifen 20 mg daily started in 2012.  Tamoxifen toxicities: Patient is experiencing the following toxicities of tamoxifen 1. Hot flashes being treated with Effexor 75 mg daily 2. Vaginal dryness Patient stopped taking tamoxifen because of hot flashes. I would like to recheck her if she is in menopause with deficit and estradiol. If as such is greater than 35 and estradiol and less than 9, we will start her on anastrozole 1 mg daily. If not, she will continue tamoxifen.  Breast Cancer Surveillance: 1. Breast exam 11/26/2014: Normal 2. Mammogram January 2016 No abnormalities. Postsurgical changes. Breast Density Category B. I recommended that she get 3-D mammograms for surveillance. Discussed the differences between different breast density categories.   I encouraged her to do more physical activity and exercise and lose some weight. I encouraged her to eat yogurt and consider taking turmeric supplementation. Return to clinic in 6 months for follow-up

## 2015-05-28 NOTE — Progress Notes (Signed)
Patient Care Team: Midge Minium, MD as PCP - General Servando Salina, MD as Consulting Physician (Obstetrics and Gynecology) Nicholas Lose, MD as Consulting Physician (Hematology and Oncology)  DIAGNOSIS: No matching staging information was found for the patient.  SUMMARY OF ONCOLOGIC HISTORY:   Breast cancer of lower-outer quadrant of left female breast   01/20/2011 Surgery Left breast lumpectomy: Invasive lobular carcinoma multiple foci 1.2 and 0.7 cm posterior margin and LCIS, micro met in one SLN, ER 100%, PR 100%, Ki-67 12%, HER-2 negative ratio 1.11: Oncotype DX 22, ROR14%   03/17/2011 - 07/17/2011 Chemotherapy Adjuvant chemotherapy AC x4 followed by Taxol weekly x8   08/28/2011 - 10/13/2011 Radiation Therapy Adjuvant radiation therapy   10/29/2011 -  Anti-estrogen oral therapy Adjuvant tamoxifen 20 mg daily plan is for 10 years    CHIEF COMPLIANT: Follow-up of mammogram and bone density currently on tamoxifen  INTERVAL HISTORY: [name] is a 54 year old lady with above-mentioned history of left breast cancer currently on adjuvant tamoxifen. She has not been taking tamoxifen for the past month because she has got intense heat sensation and she is unable to take it. She has been using Effexor but it doesn't seem to be helping her as much. She reports that she has not been exercising and has been gaining weight.  REVIEW OF SYSTEMS:   Constitutional: Denies fevers, chills or abnormal weight loss Eyes: Denies blurriness of vision Ears, nose, mouth, throat, and face: Denies mucositis or sore throat Respiratory: Denies cough, dyspnea or wheezes Cardiovascular: Denies palpitation, chest discomfort or lower extremity swelling Gastrointestinal:  Denies nausea, heartburn or change in bowel habits Skin: Severe heat sensation internally Lymphatics: Denies new lymphadenopathy or easy bruising Neurological:Denies numbness, tingling or new weaknesses Behavioral/Psych: Mood is stable, no  new changes  Breast:  denies any pain or lumps or nodules in either breasts All other systems were reviewed with the patient and are negative.  I have reviewed the past medical history, past surgical history, social history and family history with the patient and they are unchanged from previous note.  ALLERGIES:  has No Known Allergies.  MEDICATIONS:  Current Outpatient Prescriptions  Medication Sig Dispense Refill  . atenolol-chlorthalidone (TENORETIC) 50-25 MG per tablet TAKE 0.5 TABLETS BY MOUTH DAILY. 15 tablet 5  . cyclobenzaprine (FLEXERIL) 5 MG tablet Take 1 tablet (5 mg total) by mouth at bedtime. 30 tablet 1  . dicyclomine (BENTYL) 20 MG tablet TAKE 1 TABLET (20 MG TOTAL) BY MOUTH 3 (THREE) TIMES DAILY BEFORE MEALS. 180 tablet 1  . furosemide (LASIX) 40 MG tablet Take 1 tablet (40 mg total) by mouth daily as needed. 30 tablet 5  . gabapentin (NEURONTIN) 300 MG capsule Take 300 mg by mouth 4 (four) times daily. As needed    . phentermine (ADIPEX-P) 37.5 MG tablet TAKE 1 TABLET BY MOUTH EVERY DAY 30 tablet 1  . tamoxifen (NOLVADEX) 20 MG tablet TAKE 1 TABLET (20 MG TOTAL) BY MOUTH DAILY. 90 tablet 1  . Venlafaxine HCl 150 MG TB24 Take 1 tablet (150 mg total) by mouth daily. 90 each 0  . Vitamin D, Ergocalciferol, (DRISDOL) 50000 UNITS CAPS capsule Take 1 capsule (50,000 Units total) by mouth every 7 (seven) days. 4 capsule 3  . zolpidem (AMBIEN) 10 MG tablet TAKE 1 TABLET AT BEDTIME AS NEEDED 30 tablet 3   No current facility-administered medications for this visit.    PHYSICAL EXAMINATION: ECOG PERFORMANCE STATUS: 1 - Symptomatic but completely ambulatory  Filed Vitals:  05/28/15 1139  BP: 143/81  Pulse: 88  Temp: 98.2 F (36.8 C)  Resp: 18   Filed Weights   05/28/15 1139  Weight: 235 lb 6.4 oz (106.777 kg)    GENERAL:alert, no distress and comfortable SKIN: skin color, texture, turgor are normal, no rashes or significant lesions EYES: normal, Conjunctiva are pink  and non-injected, sclera clear OROPHARYNX:no exudate, no erythema and lips, buccal mucosa, and tongue normal  NECK: supple, thyroid normal size, non-tender, without nodularity LYMPH:  no palpable lymphadenopathy in the cervical, axillary or inguinal LUNGS: clear to auscultation and percussion with normal breathing effort HEART: regular rate & rhythm and no murmurs and no lower extremity edema ABDOMEN:abdomen soft, non-tender and normal bowel sounds Musculoskeletal:no cyanosis of digits and no clubbing  NEURO: alert & oriented x 3 with fluent speech, no focal motor/sensory deficits  LABORATORY DATA:  I have reviewed the data as listed   Chemistry      Component Value Date/Time   NA 139 05/22/2015 1411   NA 140 11/19/2014 1232   K 3.8 05/22/2015 1411   K 3.7 11/19/2014 1232   CL 102 05/22/2015 1411   CL 104 05/01/2013 1203   CO2 30 05/22/2015 1411   CO2 28 11/19/2014 1232   BUN 11 05/22/2015 1411   BUN 12.8 11/19/2014 1232   CREATININE 0.76 05/22/2015 1411   CREATININE 0.8 11/19/2014 1232      Component Value Date/Time   CALCIUM 9.7 05/22/2015 1411   CALCIUM 10.0 11/19/2014 1232   ALKPHOS 86 05/22/2015 1411   ALKPHOS 89 11/19/2014 1232   AST 15 05/22/2015 1411   AST 14 11/19/2014 1232   ALT 14 05/22/2015 1411   ALT 11 11/19/2014 1232   BILITOT 0.3 05/22/2015 1411   BILITOT 0.29 11/19/2014 1232       Lab Results  Component Value Date   WBC 5.1 05/22/2015   HGB 12.8 05/22/2015   HCT 37.3 05/22/2015   MCV 91.1 05/22/2015   PLT 339.0 05/22/2015   NEUTROABS 2.4 05/22/2015    ASSESSMENT & PLAN:  Breast cancer of lower-outer quadrant of left female breast 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 x4 followed by Taxol x8 and status post radiation therapy. Currently on tamoxifen 20 mg daily started in 2012.  Tamoxifen toxicities: Patient is  experiencing the following toxicities of tamoxifen 1. Hot flashes being treated with Effexor 75 mg daily 2. Vaginal dryness Patient stopped taking tamoxifen because of hot flashes. I would like to recheck her if she is in menopause with deficit and estradiol. If as such is greater than 35 and estradiol and less than 9, we will start her on anastrozole 1 mg daily. If not, she will continue tamoxifen.  Breast Cancer Surveillance: 1. Breast exam 11/26/2014: Normal 2. Mammogram January 2016 No abnormalities. Postsurgical changes. Breast Density Category B. I recommended that she get 3-D mammograms for surveillance. Discussed the differences between different breast density categories.   I encouraged her to do more physical activity and exercise and lose some weight. I encouraged her to eat yogurt and consider taking turmeric supplementation. Return to clinic in 6 months for follow-up     Orders Placed This Encounter  Procedures  . Follicle stimulating hormone    Standing Status: Future     Number of Occurrences:      Standing Expiration Date: 07/01/2016  . Estradiol, Ultra Sens  Standing Status: Future     Number of Occurrences:      Standing Expiration Date: 05/27/2016   The patient has a good understanding of the overall plan. she agrees with it. she will call with any problems that may develop before the next visit here.   Rulon Eisenmenger, MD

## 2015-05-29 ENCOUNTER — Other Ambulatory Visit: Payer: Self-pay | Admitting: Hematology and Oncology

## 2015-05-29 DIAGNOSIS — C50512 Malignant neoplasm of lower-outer quadrant of left female breast: Secondary | ICD-10-CM

## 2015-05-29 LAB — FOLLICLE STIMULATING HORMONE: FSH: 25.2 m[IU]/mL

## 2015-05-29 MED ORDER — TAMOXIFEN CITRATE 20 MG PO TABS: ORAL_TABLET | ORAL | Status: DC

## 2015-06-01 LAB — ESTRADIOL, ULTRA SENS: ESTRADIOL, ULTRA SENSITIVE: 13 pg/mL

## 2015-06-18 ENCOUNTER — Other Ambulatory Visit: Payer: Self-pay | Admitting: Family Medicine

## 2015-06-18 NOTE — Telephone Encounter (Signed)
Med filled.  

## 2015-09-03 ENCOUNTER — Other Ambulatory Visit: Payer: Self-pay | Admitting: Family Medicine

## 2015-09-03 NOTE — Telephone Encounter (Signed)
Medication filled to pharmacy as requested.   

## 2015-09-03 NOTE — Telephone Encounter (Signed)
Last OV 05-22-15 zolpidem last filled 05-22-15 #30 with 3

## 2015-10-05 ENCOUNTER — Other Ambulatory Visit: Payer: Self-pay | Admitting: Family Medicine

## 2015-10-07 NOTE — Telephone Encounter (Signed)
Medication Detail      Disp Refills Start End     Vitamin D, Ergocalciferol, (DRISDOL) 50000 UNITS CAPS capsule 4 capsule 3 05/28/2015     Sig - Route: Take 1 capsule (50,000 Units total) by mouth every 7 (seven) days. - Oral    E-Prescribing Status: Receipt confirmed by pharmacy (05/28/2015 9:18 AM EDT)    Notes Recorded by Midge Minium, MD on 05/23/2015 at 12:58 PM Total cholesterol and LDL are both higher than previous- please work on healthy diet and regular exercise to improve these numbers. Vit D is low. Based on this, we need to start 50,000 units weekly x12 weeks in addition to daily OTC supplement of at least 2000 units.  Is patient to continue without prior labs; please advise on refills/SLS

## 2015-11-12 ENCOUNTER — Ambulatory Visit (HOSPITAL_BASED_OUTPATIENT_CLINIC_OR_DEPARTMENT_OTHER): Payer: BLUE CROSS/BLUE SHIELD | Admitting: Hematology and Oncology

## 2015-11-12 ENCOUNTER — Ambulatory Visit: Payer: BLUE CROSS/BLUE SHIELD

## 2015-11-12 ENCOUNTER — Encounter: Payer: Self-pay | Admitting: Hematology and Oncology

## 2015-11-12 ENCOUNTER — Telehealth: Payer: Self-pay | Admitting: Hematology and Oncology

## 2015-11-12 VITALS — BP 116/64 | HR 70 | Temp 97.5°F | Resp 18 | Ht 62.75 in | Wt 220.0 lb

## 2015-11-12 DIAGNOSIS — C50512 Malignant neoplasm of lower-outer quadrant of left female breast: Secondary | ICD-10-CM | POA: Diagnosis not present

## 2015-11-12 NOTE — Assessment & Plan Note (Signed)
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 x4 followed by Taxol x8 and status post radiation therapy. Currently on tamoxifen 20 mg daily started in November 2012. (plan is to treat for 10 years)  Tamoxifen toxicities: Patient is experiencing the following toxicities of tamoxifen 1. Hot flashes being treated with Effexor 75 mg daily 2. Vaginal dryness Patient stopped taking tamoxifen because of hot flashes. [place] 25 and estradiol 13 done in May 2016 (still perimenopausal).  Breast Cancer Surveillance: 1. Breast exam 11/12/2015: Normal 2. Mammogram January 2016 No abnormalities. Postsurgical changes. Breast Density Category B. I recommended that she get 3-D mammograms for surveillance. Discussed the differences between different breast density categories.  Survivorship: Discussed the importance of physical exercise in decreasing the likelihood of breast cancer recurrence. Recommended 30 mins daily 6 days a week of either brisk walking or cycling or swimming. Encouraged patient to eat more fruits and vegetables and decrease red meat.   Return to clinic in 1 year for follow-up

## 2015-11-12 NOTE — Progress Notes (Signed)
Patient Care Team: Midge Minium, MD as PCP - General Servando Salina, MD as Consulting Physician (Obstetrics and Gynecology) Nicholas Lose, MD as Consulting Physician (Hematology and Oncology)  DIAGNOSIS: No matching staging information was found for the patient.  SUMMARY OF ONCOLOGIC HISTORY:   Breast cancer of lower-outer quadrant of left female breast ([place])   01/20/2011 Surgery Left breast lumpectomy: Invasive lobular carcinoma multiple foci 1.2 and 0.7 cm posterior margin and LCIS, micro met in one SLN, ER 100%, PR 100%, Ki-67 12%, HER-2 negative ratio 1.11: Oncotype DX 22, ROR14%   03/17/2011 - 07/17/2011 Chemotherapy Adjuvant chemotherapy AC x4 followed by Taxol weekly x8   08/28/2011 - 10/13/2011 Radiation Therapy Adjuvant radiation therapy   10/29/2011 -  Anti-estrogen oral therapy Adjuvant tamoxifen 20 mg daily plan is for 10 years    CHIEF COMPLIANT: Patient stopped tamoxifen therapy in May 2016  INTERVAL HISTORY: [name] is a 54 year old with above-mentioned history of left breast lobular cancer treated with lumpectomy followed by adjuvant chemotherapy and radiation. [name] has been on tamoxifen from November 2012 to May 2016. [name] stopped at that time because of persistent hot flashes In spite of taking Effexor. We had checked if [name] was menopausal and [name] was found to be perimenopausal and hence we did not use aromatase inhibitors. [name] is here today to discuss whether [name] should go back to taking tamoxifen therapy or not. [name] is now more interested in maintaining her antiestrogen therapy. [name] denies any breast pain or lumps or nodules. Her mammogram is due in January. Hot flashes have completely resolved.  REVIEW OF SYSTEMS:   Constitutional: Denies fevers, chills or abnormal weight loss Eyes: Denies blurriness of vision Ears, nose, mouth, throat, and face: Denies mucositis or sore throat Respiratory: Denies cough, dyspnea or wheezes Cardiovascular: Denies  palpitation, chest discomfort or lower extremity swelling Gastrointestinal:  Denies nausea, heartburn or change in bowel habits Skin: Denies abnormal skin rashes Lymphatics: Denies new lymphadenopathy or easy bruising Neurological:Denies numbness, tingling or new weaknesses Behavioral/Psych: Mood is stable, no new changes  Breast:  denies any pain or lumps or nodules in either breasts All other systems were reviewed with the patient and are negative.  I have reviewed the past medical history, past surgical history, social history and family history with the patient and they are unchanged from previous note.  ALLERGIES:  has No Known Allergies.  MEDICATIONS:  Current Outpatient Prescriptions  Medication Sig Dispense Refill  . atenolol-chlorthalidone (TENORETIC) 50-25 MG per tablet TAKE 0.5 TABLETS BY MOUTH DAILY. 15 tablet 5  . cyclobenzaprine (FLEXERIL) 5 MG tablet Take 1 tablet (5 mg total) by mouth at bedtime. 30 tablet 1  . dicyclomine (BENTYL) 20 MG tablet TAKE 1 TABLET (20 MG TOTAL) BY MOUTH 3 (THREE) TIMES DAILY BEFORE MEALS. 180 tablet 1  . furosemide (LASIX) 40 MG tablet Take 1 tablet (40 mg total) by mouth daily as needed. 30 tablet 5  . gabapentin (NEURONTIN) 300 MG capsule Take 300 mg by mouth 4 (four) times daily. As needed    . phentermine (ADIPEX-P) 37.5 MG tablet TAKE 1 TABLET BY MOUTH EVERY DAY 30 tablet 1  . tamoxifen (NOLVADEX) 20 MG tablet TAKE 1 TABLET (20 MG TOTAL) BY MOUTH DAILY. 90 tablet 3  . venlafaxine XR (EFFEXOR-XR) 150 MG 24 hr capsule TAKE 1 TABLET (150 MG TOTAL) BY MOUTH DAILY. 30 capsule 3  . Vitamin D, Ergocalciferol, (DRISDOL) 50000 UNITS CAPS capsule Take 1 capsule (50,000 Units total) by mouth  every 7 (seven) days. 4 capsule 3  . zolpidem (AMBIEN) 10 MG tablet TAKE 1 TABLET AT BEDTIME AS NEEDED 30 tablet 1   No current facility-administered medications for this visit.    PHYSICAL EXAMINATION: ECOG PERFORMANCE STATUS: 1 - Symptomatic but completely  ambulatory  Filed Vitals:   11/12/15 1202  BP: 116/64  Pulse: 70  Temp: 97.5 F (36.4 C)  Resp: 18   Filed Weights   11/12/15 1202  Weight: 220 lb (99.791 kg)    GENERAL:alert, no distress and comfortable SKIN: skin color, texture, turgor are normal, no rashes or significant lesions EYES: normal, Conjunctiva are pink and non-injected, sclera clear OROPHARYNX:no exudate, no erythema and lips, buccal mucosa, and tongue normal  NECK: supple, thyroid normal size, non-tender, without nodularity LYMPH:  no palpable lymphadenopathy in the cervical, axillary or inguinal LUNGS: clear to auscultation and percussion with normal breathing effort HEART: regular rate & rhythm and no murmurs and no lower extremity edema ABDOMEN:abdomen soft, non-tender and normal bowel sounds Musculoskeletal:no cyanosis of digits and no clubbing  NEURO: alert & oriented x 3 with fluent speech, no focal motor/sensory deficits BREAST: No palpable masses or nodules in either right or left breasts. No palpable axillary supraclavicular or infraclavicular adenopathy no breast tenderness or nipple discharge. (exam performed in the presence of a chaperone)  LABORATORY DATA:  I have reviewed the data as listed   Chemistry      Component Value Date/Time   NA 139 05/22/2015 1411   NA 140 11/19/2014 1232   K 3.8 05/22/2015 1411   K 3.7 11/19/2014 1232   CL 102 05/22/2015 1411   CL 104 05/01/2013 1203   CO2 30 05/22/2015 1411   CO2 28 11/19/2014 1232   BUN 11 05/22/2015 1411   BUN 12.8 11/19/2014 1232   CREATININE 0.76 05/22/2015 1411   CREATININE 0.8 11/19/2014 1232      Component Value Date/Time   CALCIUM 9.7 05/22/2015 1411   CALCIUM 10.0 11/19/2014 1232   ALKPHOS 86 05/22/2015 1411   ALKPHOS 89 11/19/2014 1232   AST 15 05/22/2015 1411   AST 14 11/19/2014 1232   ALT 14 05/22/2015 1411   ALT 11 11/19/2014 1232   BILITOT 0.3 05/22/2015 1411   BILITOT 0.29 11/19/2014 1232       Lab Results    Component Value Date   WBC 5.1 05/22/2015   HGB 12.8 05/22/2015   HCT 37.3 05/22/2015   MCV 91.1 05/22/2015   PLT 339.0 05/22/2015   NEUTROABS 2.4 05/22/2015   ASSESSMENT & PLAN:  Breast cancer of lower-outer quadrant of left female breast 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 x4 followed by Taxol x8 and status post radiation therapy. Currently on tamoxifen 20 mg daily started in November 2012 but [name] stopped taking it May 2015 due to severe hot flashes. (plan is to treat for 10 years)  Tamoxifen toxicities: Patient is experiencing the following toxicities of tamoxifen 1. Hot flashes being treated with Effexor 75 mg daily 2. Vaginal dryness Patient stopped taking tamoxifen because of hot flashes. [place] 25 and estradiol 13 done in May 2016 (still perimenopausal). Once [name] becomes menopausal, we will plan to treat her with anastrozole. We will send her to the lab to get blood work done for [facility] and estradiol today. I will call her with the results of this test and if [name] is truly menopausal  I will call in a prescription for anastrozole.  Breast Cancer Surveillance: 1. Breast exam 11/12/2015: Normal 2. Mammogram January 2016 No abnormalities. Postsurgical changes. Breast Density Category B. I recommended that [name] get 3-D mammograms for surveillance. Discussed the differences between different breast density categories.  Survivorship: Discussed the importance of physical exercise in decreasing the likelihood of breast cancer recurrence. Recommended 30 mins daily 6 days a week of either brisk walking or cycling or swimming. Encouraged patient to eat more fruits and vegetables and decrease red meat.   Return to clinic in 3 months for follow-up  No orders of the defined types were placed in this encounter.   The patient has a good understanding of the overall  plan. [name] agrees with it. [name] will call with any problems that may develop before the next visit here.   Rulon Eisenmenger, MD 11/12/2015

## 2015-11-12 NOTE — Addendum Note (Signed)
Addended by: Prentiss Bells on: 11/12/2015 03:17 PM   Modules accepted: Medications

## 2015-11-12 NOTE — Telephone Encounter (Signed)
Appointments made and avs printed for patient °

## 2015-11-12 NOTE — Addendum Note (Signed)
Addended by: Prentiss Bells on: 11/12/2015 01:07 PM   Modules accepted: Orders

## 2015-11-13 LAB — FOLLICLE STIMULATING HORMONE: FSH: 33.5 m[IU]/mL

## 2015-11-16 LAB — ESTRADIOL, ULTRA SENS: ESTRADIOL, ULTRA SENSITIVE: 13 pg/mL

## 2015-11-25 ENCOUNTER — Ambulatory Visit (INDEPENDENT_AMBULATORY_CARE_PROVIDER_SITE_OTHER): Payer: BLUE CROSS/BLUE SHIELD | Admitting: Family Medicine

## 2015-11-25 ENCOUNTER — Encounter: Payer: Self-pay | Admitting: Family Medicine

## 2015-11-25 VITALS — BP 120/70 | HR 64 | Temp 98.0°F | Resp 16 | Ht 63.0 in | Wt 223.2 lb

## 2015-11-25 DIAGNOSIS — E785 Hyperlipidemia, unspecified: Secondary | ICD-10-CM | POA: Diagnosis not present

## 2015-11-25 DIAGNOSIS — E669 Obesity, unspecified: Secondary | ICD-10-CM | POA: Diagnosis not present

## 2015-11-25 DIAGNOSIS — G622 Polyneuropathy due to other toxic agents: Secondary | ICD-10-CM

## 2015-11-25 DIAGNOSIS — IMO0002 Reserved for concepts with insufficient information to code with codable children: Secondary | ICD-10-CM

## 2015-11-25 DIAGNOSIS — I10 Essential (primary) hypertension: Secondary | ICD-10-CM

## 2015-11-25 LAB — HEPATIC FUNCTION PANEL
ALK PHOS: 84 U/L (ref 39–117)
ALT: 14 U/L (ref 0–35)
AST: 14 U/L (ref 0–37)
Albumin: 4.1 g/dL (ref 3.5–5.2)
BILIRUBIN DIRECT: 0 mg/dL (ref 0.0–0.3)
BILIRUBIN TOTAL: 0.3 mg/dL (ref 0.2–1.2)
Total Protein: 7.6 g/dL (ref 6.0–8.3)

## 2015-11-25 LAB — LDL CHOLESTEROL, DIRECT: LDL DIRECT: 139 mg/dL

## 2015-11-25 LAB — LIPID PANEL
Cholesterol: 201 mg/dL — ABNORMAL HIGH (ref 0–200)
HDL: 46 mg/dL (ref >39.00)
NONHDL: 154.93
Total CHOL/HDL Ratio: 4
Triglycerides: 214 mg/dL — ABNORMAL HIGH (ref 0.0–149.0)
VLDL: 42.8 mg/dL — ABNORMAL HIGH (ref 0.0–40.0)

## 2015-11-25 LAB — BASIC METABOLIC PANEL
BUN: 12 mg/dL (ref 6–23)
CO2: 31 mEq/L (ref 19–32)
Calcium: 10 mg/dL (ref 8.4–10.5)
Chloride: 101 mEq/L (ref 96–112)
Creatinine, Ser: 0.68 mg/dL (ref 0.40–1.20)
GFR: 116.21 mL/min (ref >60.00)
Glucose, Bld: 134 mg/dL — ABNORMAL HIGH (ref 70–99)
Potassium: 3.8 mEq/L (ref 3.5–5.1)
Sodium: 140 mEq/L (ref 135–145)

## 2015-11-25 LAB — CBC WITH DIFFERENTIAL/PLATELET
Basophils Absolute: 0 10*3/uL (ref 0.0–0.1)
Basophils Relative: 0.9 % (ref 0.0–3.0)
EOS ABS: 0.1 10*3/uL (ref 0.0–0.7)
EOS PCT: 1.1 % (ref 0.0–5.0)
HEMATOCRIT: 39.9 % (ref 36.0–46.0)
Hemoglobin: 13.2 g/dL (ref 12.0–15.0)
Lymphocytes Relative: 36.9 % (ref 12.0–46.0)
Lymphs Abs: 2 10*3/uL (ref 0.7–4.0)
MCHC: 33.2 g/dL (ref 30.0–36.0)
MCV: 93.7 fl (ref 78.0–100.0)
MONO ABS: 0.2 10*3/uL (ref 0.1–1.0)
Monocytes Relative: 4.4 % (ref 3.0–12.0)
NEUTROS ABS: 3.1 10*3/uL (ref 1.4–7.7)
Neutrophils Relative %: 56.7 % (ref 43.0–77.0)
PLATELETS: 327 10*3/uL (ref 150.0–400.0)
RBC: 4.26 Mil/uL (ref 3.87–5.11)
RDW: 13 % (ref 11.5–15.5)
WBC: 5.5 10*3/uL (ref 4.0–10.5)

## 2015-11-25 MED ORDER — DICYCLOMINE HCL 20 MG PO TABS: ORAL_TABLET | ORAL | Status: DC

## 2015-11-25 MED ORDER — ATENOLOL-CHLORTHALIDONE 50-25 MG PO TABS: ORAL_TABLET | ORAL | Status: DC

## 2015-11-25 MED ORDER — PREGABALIN 75 MG PO CAPS: 75.0000 mg | ORAL_CAPSULE | Freq: Three times a day (TID) | ORAL | Status: DC

## 2015-11-25 MED ORDER — ZOLPIDEM TARTRATE 10 MG PO TABS: 10.0000 mg | ORAL_TABLET | Freq: Every evening | ORAL | Status: DC | PRN

## 2015-11-25 NOTE — Assessment & Plan Note (Signed)
Ongoing issue.  No relief w/ gabapentin.  Will switch to Lyrica and monitor for improvement.  Pt expressed understanding and is in agreement w/ plan.

## 2015-11-25 NOTE — Assessment & Plan Note (Signed)
Chronic problem for pt.  Not currently on meds.  Attempting to control w/ healthy diet and regular exercise.  Check labs.  Start meds prn.

## 2015-11-25 NOTE — Assessment & Plan Note (Signed)
Chronic problem.  Pt continues to gain weight.  Her exercise is limited due to her severe chemo neuropathy.  Stressed need for healthy food choices.  Will follow.

## 2015-11-25 NOTE — Assessment & Plan Note (Signed)
Chronic problem.  Well controlled.  Asymptomatic.  Check labs.  No anticipated med changes.  Will continue to follow. 

## 2015-11-25 NOTE — Patient Instructions (Signed)
Schedule your complete physical in 6 months We'll notify you of your lab results and make any changes if needed STOP the Gabapentin START the Lyrica 3x/day for the neuropathy Continue to work on healthy food choices and get regular exercise Call with any questions or concerns If you want to join [place] at the new [place] office, any scheduled appointments will automatically transfer and we will see you at [phone] [place] [address] [place], [place] [phone] ([place] 12/31/15) Happy Holidays!!!

## 2015-11-25 NOTE — Progress Notes (Signed)
   Subjective:    Patient ID: [name], female    DOB: 01/05/1961, 54 y.o.   MRN: [id number]  HPI HTN- chronic problem.  On atenolol-chlorthalidone and Lasix.  Well controlled today.  No CP, SOB, HAs, visual changes, edema.  Hyperlipidemia- chronic problem, attempting to control w/ healthy diet and regular exercise.  Not currently on meds.  Obesity- pt is doing some walking but struggles w/ chemo neuropathy.  Not following a particular diet.  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  Painful to have sheet touch at night.   Review of Systems For ROS see HPI     Objective:   Physical Exam  Constitutional: She is oriented to person, place, and time. She appears well-developed and well-nourished. No distress.  HENT:  Head: Normocephalic and atraumatic.  Eyes: Conjunctivae and EOM are normal. Pupils are equal, round, and reactive to light.  Neck: Normal range of motion. Neck supple. No thyromegaly present.  Cardiovascular: Normal rate, regular rhythm, normal heart sounds and intact distal pulses.   No murmur heard. Pulmonary/Chest: Effort normal and breath sounds normal. No respiratory distress.  Abdominal: Soft. She exhibits no distension. There is no tenderness.  Musculoskeletal: She exhibits no edema.  Lymphadenopathy:    She has no cervical adenopathy.  Neurological: She is alert and oriented to person, place, and time.  Skin: Skin is warm and dry.  Psychiatric: She has a normal mood and affect. Her behavior is normal.  Vitals reviewed.         Assessment & Plan:

## 2015-11-25 NOTE — Progress Notes (Signed)
Pre visit review using our clinic review tool, if applicable. No additional management support is needed unless otherwise documented below in the visit note. 

## 2015-11-26 ENCOUNTER — Other Ambulatory Visit (INDEPENDENT_AMBULATORY_CARE_PROVIDER_SITE_OTHER): Payer: BLUE CROSS/BLUE SHIELD

## 2015-11-26 DIAGNOSIS — R739 Hyperglycemia, unspecified: Secondary | ICD-10-CM

## 2015-11-26 LAB — HEMOGLOBIN A1C: Hgb A1c MFr Bld: 6.2 % (ref 4.6–6.5)

## 2015-11-27 ENCOUNTER — Encounter: Payer: Self-pay | Admitting: General Practice

## 2015-11-28 ENCOUNTER — Other Ambulatory Visit: Payer: Self-pay | Admitting: Hematology and Oncology

## 2015-11-28 ENCOUNTER — Telehealth: Payer: Self-pay | Admitting: Hematology and Oncology

## 2015-11-28 DIAGNOSIS — C50512 Malignant neoplasm of lower-outer quadrant of left female breast: Secondary | ICD-10-CM

## 2015-11-28 NOTE — Telephone Encounter (Signed)
Called patient and she is aware of her visit

## 2016-01-06 ENCOUNTER — Other Ambulatory Visit: Payer: Self-pay

## 2016-01-06 ENCOUNTER — Other Ambulatory Visit: Payer: Self-pay | Admitting: Hematology and Oncology

## 2016-01-06 DIAGNOSIS — Z853 Personal history of malignant neoplasm of breast: Secondary | ICD-10-CM

## 2016-01-24 ENCOUNTER — Ambulatory Visit
Admission: RE | Admit: 2016-01-24 | Discharge: 2016-01-24 | Disposition: A | Discharge status: Home / Self Care | Payer: BLUE CROSS/BLUE SHIELD | Source: Ambulatory Visit | Attending: Hematology and Oncology | Admitting: Hematology and Oncology

## 2016-01-24 DIAGNOSIS — Z853 Personal history of malignant neoplasm of breast: Secondary | ICD-10-CM

## 2016-02-11 ENCOUNTER — Ambulatory Visit: Payer: BLUE CROSS/BLUE SHIELD | Admitting: Hematology and Oncology

## 2016-04-02 ENCOUNTER — Telehealth: Payer: Self-pay

## 2016-04-02 NOTE — Telephone Encounter (Signed)
Request for letter of medical necessity forwarded to [facility] by mail.

## 2016-05-14 ENCOUNTER — Other Ambulatory Visit: Payer: BLUE CROSS/BLUE SHIELD

## 2016-05-14 DIAGNOSIS — C50512 Malignant neoplasm of lower-outer quadrant of left female breast: Secondary | ICD-10-CM

## 2016-05-15 LAB — FOLLICLE STIMULATING HORMONE: FSH: 25.1 m[IU]/mL

## 2016-05-18 ENCOUNTER — Other Ambulatory Visit: Payer: Self-pay | Admitting: Hematology and Oncology

## 2016-05-18 ENCOUNTER — Telehealth: Payer: Self-pay | Admitting: Hematology and Oncology

## 2016-05-18 DIAGNOSIS — C50512 Malignant neoplasm of lower-outer quadrant of left female breast: Secondary | ICD-10-CM

## 2016-05-18 LAB — ESTRADIOL, ULTRA SENS: ESTRADIOL, SENSITIVE: 8.6 pg/mL

## 2016-05-18 MED ORDER — ANASTROZOLE 1 MG PO TABS: 1.0000 mg | ORAL_TABLET | Freq: Every day | ORAL | Status: DC

## 2016-05-18 MED ORDER — GABAPENTIN 300 MG PO CAPS: 300.0000 mg | ORAL_CAPSULE | Freq: Three times a day (TID) | ORAL | Status: DC

## 2016-05-18 NOTE — Telephone Encounter (Signed)
I called the patient to discuss the results of [place] and estradiol. I believe that the patient is menopausal. I instructed her to stop tamoxifen and use anastrozole 1 mg daily.  Neuropathy: Patient is concerned about the tingling and numbness of hands and feet. Previously she had tried Alupent and and Lyrica. She would like to try gabapentin once again. I sent her a new prescription for gabapentin that she will start once a day then increase it to twice a day and then finally to 3 times a day.  We will move her next follow-up appointment to 3 months to assess tolerability to anastrozole.

## 2016-05-21 ENCOUNTER — Ambulatory Visit: Payer: BLUE CROSS/BLUE SHIELD | Admitting: Hematology and Oncology

## 2016-05-26 ENCOUNTER — Ambulatory Visit (INDEPENDENT_AMBULATORY_CARE_PROVIDER_SITE_OTHER): Payer: BLUE CROSS/BLUE SHIELD | Admitting: Family Medicine

## 2016-05-26 ENCOUNTER — Encounter: Payer: Self-pay | Admitting: Family Medicine

## 2016-05-26 VITALS — BP 121/74 | HR 68 | Temp 98.1°F | Resp 16 | Ht 63.0 in | Wt 216.5 lb

## 2016-05-26 DIAGNOSIS — Z0000 Encounter for general adult medical examination without abnormal findings: Secondary | ICD-10-CM

## 2016-05-26 LAB — HEPATIC FUNCTION PANEL
ALBUMIN: 4.2 g/dL (ref 3.5–5.2)
ALT: 9 U/L (ref 0–35)
AST: 11 U/L (ref 0–37)
Alkaline Phosphatase: 71 U/L (ref 39–117)
Bilirubin, Direct: 0.1 mg/dL (ref 0.0–0.3)
TOTAL PROTEIN: 7 g/dL (ref 6.0–8.3)
Total Bilirubin: 0.3 mg/dL (ref 0.2–1.2)

## 2016-05-26 LAB — CBC WITH DIFFERENTIAL/PLATELET
BASOS ABS: 0 10*3/uL (ref 0.0–0.1)
Basophils Relative: 0.6 % (ref 0.0–3.0)
Eosinophils Absolute: 0.1 10*3/uL (ref 0.0–0.7)
Eosinophils Relative: 1.7 % (ref 0.0–5.0)
HEMATOCRIT: 38.3 % (ref 36.0–46.0)
Hemoglobin: 12.9 g/dL (ref 12.0–15.0)
LYMPHS PCT: 43.5 % (ref 12.0–46.0)
Lymphs Abs: 2 10*3/uL (ref 0.7–4.0)
MCHC: 33.7 g/dL (ref 30.0–36.0)
MCV: 91.9 fl (ref 78.0–100.0)
MONOS PCT: 5.8 % (ref 3.0–12.0)
Monocytes Absolute: 0.3 10*3/uL (ref 0.1–1.0)
NEUTROS PCT: 48.4 % (ref 43.0–77.0)
Neutro Abs: 2.3 10*3/uL (ref 1.4–7.7)
Platelets: 288 10*3/uL (ref 150.0–400.0)
RBC: 4.17 Mil/uL (ref 3.87–5.11)
RDW: 13.2 % (ref 11.5–15.5)
WBC: 4.7 10*3/uL (ref 4.0–10.5)

## 2016-05-26 LAB — BASIC METABOLIC PANEL
BUN: 14 mg/dL (ref 6–23)
CALCIUM: 9.7 mg/dL (ref 8.4–10.5)
CO2: 32 mEq/L (ref 19–32)
Chloride: 103 mEq/L (ref 96–112)
Creatinine, Ser: 0.68 mg/dL (ref 0.40–1.20)
GFR: 115.99 mL/min (ref >60.00)
GLUCOSE: 95 mg/dL (ref 70–99)
Potassium: 4 mEq/L (ref 3.5–5.1)
Sodium: 139 mEq/L (ref 135–145)

## 2016-05-26 LAB — VITAMIN D 25 HYDROXY (VIT D DEFICIENCY, FRACTURES): VITD: 25.42 ng/mL — ABNORMAL LOW (ref 30.00–100.00)

## 2016-05-26 LAB — LIPID PANEL
CHOLESTEROL: 207 mg/dL — AB (ref 0–200)
HDL: 52.4 mg/dL (ref >39.00)
LDL CALC: 137 mg/dL — AB (ref 0–99)
NonHDL: 154.83
TRIGLYCERIDES: 90 mg/dL (ref 0.0–149.0)
Total CHOL/HDL Ratio: 4
VLDL: 18 mg/dL (ref 0.0–40.0)

## 2016-05-26 LAB — TSH: TSH: 1.62 u[IU]/mL (ref 0.35–4.50)

## 2016-05-26 MED ORDER — CYCLOBENZAPRINE HCL 5 MG PO TABS: 5.0000 mg | ORAL_TABLET | Freq: Every day | ORAL | Status: DC

## 2016-05-26 MED ORDER — ZOLPIDEM TARTRATE 10 MG PO TABS
10.0000 mg | ORAL_TABLET | Freq: Every evening | ORAL | Status: DC | PRN
Start: 2016-05-26 — End: 2016-11-27

## 2016-05-26 MED ORDER — PHENTERMINE HCL 37.5 MG PO CAPS: 37.5000 mg | ORAL_CAPSULE | ORAL | Status: DC

## 2016-05-26 NOTE — Progress Notes (Signed)
   Subjective:    Patient ID: [name], female    DOB: 10-22-1961, 55 y.o.   MRN: [id number]  HPI CPE- UTD on mammo w/ Oncology, UTD on colonoscopy.  No need for pap due to hysterectomy.     Review of Systems Patient reports no vision/ hearing changes, adenopathy,fever, weight change,  persistant/recurrent hoarseness , swallowing issues, chest pain, palpitations, edema, persistant/recurrent cough, hemoptysis, dyspnea (rest/exertional/paroxysmal nocturnal), gastrointestinal bleeding (melena, rectal bleeding), abdominal pain, significant heartburn, bowel changes, GU symptoms (dysuria, hematuria, incontinence), Gyn symptoms (abnormal  bleeding, pain),  syncope, focal weakness, memory loss, skin/hair/nail changes, abnormal bruising or bleeding, anxiety, or depression.   + chemo neuropathy    Objective:   Physical Exam General Appearance:    Alert, cooperative, no distress, appears stated age  Head:    Normocephalic, without obvious abnormality, atraumatic  Eyes:    PERRL, conjunctiva/corneas clear, EOM's intact, fundi    benign, both eyes  Ears:    Normal TM's and external ear canals, both ears  Nose:   Nares normal, septum midline, mucosa normal, no drainage    or sinus tenderness  Throat:   Lips, mucosa, and tongue normal; teeth and gums normal  Neck:   Supple, symmetrical, trachea midline, no adenopathy;    Thyroid: no enlargement/tenderness/nodules  Back:     Symmetric, no curvature, ROM normal, no CVA tenderness  Lungs:     Clear to auscultation bilaterally, respirations unlabored  Chest Wall:    No tenderness or deformity   Heart:    Regular rate and rhythm, S1 and S2 normal, no murmur, rub   or gallop  Breast Exam:    Deferred to mammo  Abdomen:     Soft, non-tender, bowel sounds active all four quadrants,    no masses, no organomegaly  Genitalia:    Deferred  Rectal:    Extremities:   Extremities normal, atraumatic, no cyanosis or edema  Pulses:   2+ and symmetric all  extremities  Skin:   Skin color, texture, turgor normal, no rashes or lesions  Lymph nodes:   Cervical, supraclavicular, and axillary nodes normal  Neurologic:   CNII-XII intact, normal strength, sensation and reflexes    throughout          Assessment & Plan:

## 2016-05-26 NOTE — Assessment & Plan Note (Signed)
Pt's PE WNL w/ exception of being overweight.  UTD on mammo, colonoscopy.  No need for pap due to hysterectomy.  Check labs.  Anticipatory guidance provided.

## 2016-05-26 NOTE — Progress Notes (Signed)
Pre visit review using our clinic review tool, if applicable. No additional management support is needed unless otherwise documented below in the visit note. 

## 2016-05-26 NOTE — Patient Instructions (Addendum)
Follow up in 6-8 weeks to recheck BP and weight loss progress We'll notify you of your lab results and make any changes if needed Restart the phentermine daily Continue to work on healthy diet and regular exercise- you can do it!!! Call with any questions or concerns Thanks for sticking with [place]! Have a great summer and Happy Early Birthday!!!

## 2016-05-27 ENCOUNTER — Other Ambulatory Visit: Payer: Self-pay | Admitting: General Practice

## 2016-05-27 MED ORDER — VITAMIN D (ERGOCALCIFEROL) 1.25 MG (50000 UNIT) PO CAPS: 50000.0000 [IU] | ORAL_CAPSULE | ORAL | Status: DC

## 2016-06-03 ENCOUNTER — Other Ambulatory Visit: Payer: Self-pay | Admitting: Hematology and Oncology

## 2016-07-15 ENCOUNTER — Ambulatory Visit: Payer: BLUE CROSS/BLUE SHIELD | Admitting: Family Medicine

## 2016-07-15 ENCOUNTER — Encounter: Payer: Self-pay | Admitting: Family Medicine

## 2016-07-15 DIAGNOSIS — C50512 Malignant neoplasm of lower-outer quadrant of left female breast: Secondary | ICD-10-CM

## 2016-07-15 MED ORDER — GABAPENTIN 300 MG PO CAPS: 300.0000 mg | ORAL_CAPSULE | Freq: Three times a day (TID) | ORAL | Status: DC

## 2016-07-29 ENCOUNTER — Ambulatory Visit: Payer: BLUE CROSS/BLUE SHIELD | Admitting: Family Medicine

## 2016-08-05 ENCOUNTER — Encounter: Payer: Self-pay | Admitting: Family Medicine

## 2016-08-05 ENCOUNTER — Ambulatory Visit (INDEPENDENT_AMBULATORY_CARE_PROVIDER_SITE_OTHER): Payer: BLUE CROSS/BLUE SHIELD | Admitting: Family Medicine

## 2016-08-05 VITALS — BP 124/82 | HR 73 | Temp 98.0°F | Resp 16 | Ht 63.0 in | Wt 223.5 lb

## 2016-08-05 DIAGNOSIS — E669 Obesity, unspecified: Secondary | ICD-10-CM | POA: Diagnosis not present

## 2016-08-05 NOTE — Progress Notes (Signed)
   Subjective:    Patient ID: [name], female    DOB: 1961/07/07, 55 y.o.   MRN: [id number]  HPI Weight loss f/u- pt was started on phentermine at last visit.  Pt has actually gained 7 lbs since last visit.  Pt only took med x2-3 weeks consistently.  No regular exercise.  Pt's goal is to resume walking in the evenings.  Admits to more snacking.  No palpitations or anxiety on medication.   Review of Systems For ROS see HPI     Objective:   Physical Exam  Constitutional: She appears well-developed and well-nourished. No distress.  HENT:  Head: Normocephalic and atraumatic.  Cardiovascular: Normal rate, regular rhythm, normal heart sounds and intact distal pulses.   Pulmonary/Chest: Effort normal and breath sounds normal. No respiratory distress. She has no wheezes. She has no rales.  Neurological: She is alert.  Skin: Skin is warm and dry.  Psychiatric: She has a normal mood and affect. Her behavior is normal. Thought content normal.  Vitals reviewed.         Assessment & Plan:

## 2016-08-05 NOTE — Assessment & Plan Note (Signed)
Pt has actually gained weight since starting the phentermine at last visit.  She admits to not taking it regularly and snacking more, exercising less due to issues w/ her son.  Stressed need for healthy diet and regular exercise.  Will follow.

## 2016-08-05 NOTE — Progress Notes (Signed)
Pre visit review using our clinic review tool, if applicable. No additional management support is needed unless otherwise documented below in the visit note. 

## 2016-08-05 NOTE — Patient Instructions (Signed)
Follow up in 2-3 months to recheck weight loss progress Restart the phentermine daily to help curb the appetite Schedule exercise the way you would schedule an appt- it will hold you more accountable Try and eat low carb, high fruit/veggie intake Call with any questions or concerns Enjoy the rest of your summer!!!

## 2016-08-18 ENCOUNTER — Encounter: Payer: Self-pay | Admitting: Hematology and Oncology

## 2016-08-18 ENCOUNTER — Ambulatory Visit (HOSPITAL_BASED_OUTPATIENT_CLINIC_OR_DEPARTMENT_OTHER): Payer: BLUE CROSS/BLUE SHIELD | Admitting: Hematology and Oncology

## 2016-08-18 ENCOUNTER — Telehealth: Payer: Self-pay | Admitting: Hematology and Oncology

## 2016-08-18 DIAGNOSIS — C50512 Malignant neoplasm of lower-outer quadrant of left female breast: Secondary | ICD-10-CM | POA: Diagnosis not present

## 2016-08-18 DIAGNOSIS — C773 Secondary and unspecified malignant neoplasm of axilla and upper limb lymph nodes: Secondary | ICD-10-CM | POA: Diagnosis not present

## 2016-08-18 DIAGNOSIS — N951 Menopausal and female climacteric states: Secondary | ICD-10-CM

## 2016-08-18 NOTE — Telephone Encounter (Signed)
appt made and avs printed °

## 2016-08-18 NOTE — Assessment & Plan Note (Signed)
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 x4 followed by Taxol x8 and status post radiation therapy. Currently on tamoxifen 20 mg daily started in November 2012 but she stopped taking it May 2015 due to severe hot flashes. (plan is to treat for 10 years)  Current treatment: Anastrozole 1 mg daily started May 2017 (when she became postmenopausal) Anastrozole toxicities:  Breast Cancer Surveillance: 1. Breast exam 11/12/2015: Normal 2. Mammogram January 2017 No abnormalities. Postsurgical changes. Breast Density Category B. I recommended that she get 3-D mammograms for surveillance. Discussed the differences between different breast density categories.

## 2016-08-18 NOTE — Progress Notes (Signed)
Patient Care Team: Midge Minium, MD as PCP - General Servando Salina, MD as Consulting Physician (Obstetrics and Gynecology) Nicholas Lose, MD as Consulting Physician (Hematology and Oncology)  DIAGNOSIS: No matching staging information was found for the patient.  SUMMARY OF ONCOLOGIC HISTORY:   Breast cancer of lower-outer quadrant of left female breast ([place])   01/20/2011 Surgery    Left breast lumpectomy: Invasive lobular carcinoma multiple foci 1.2 and 0.7 cm posterior margin and LCIS, micro met in one SLN, ER 100%, PR 100%, Ki-67 12%, HER-2 negative ratio 1.11: Oncotype DX 22, ROR14%      03/17/2011 - 07/17/2011 Chemotherapy    Adjuvant chemotherapy AC x4 followed by Taxol weekly x8      08/28/2011 - 10/13/2011 Radiation Therapy    Adjuvant radiation therapy      10/29/2011 -  Anti-estrogen oral therapy    Adjuvant tamoxifen 20 mg daily, stopped May 2016 started on anastrozole in May 2017 (and she became postmenopausal), plan of treatment 5 years       CHIEF COMPLIANT: Follow-up on anastrozole  INTERVAL HISTORY: [name] is a 55 year old with above-mentioned history of left breast cancer treated with lumpectomy followed by adjuvant chemotherapy and radiation. She has been on tamoxifen since November 2012. She could not tolerate it then was stopped in 2015. She resumed antiestrogen therapy with anastrozole in May 2017 after she was found to be postmenopausal. She is here for a follow-up visit to assess tolerability to anastrozole she reports that the hot flashes have significantly improved. She still things that they are coming but not as severe as before.  REVIEW OF SYSTEMS:   Constitutional: Denies fevers, chills or abnormal weight loss Eyes: Denies blurriness of vision Ears, nose, mouth, throat, and face: Denies mucositis or sore throat Respiratory: Denies cough, dyspnea or wheezes Cardiovascular: Denies palpitation, chest discomfort Gastrointestinal:  Denies  nausea, heartburn or change in bowel habits Skin: Denies abnormal skin rashes Lymphatics: Denies new lymphadenopathy or easy bruising Neurological: Peripheral neuropathies present Behavioral/Psych: Mood is stable, no new changes  Extremities: No lower extremity edema Breast:  denies any pain or lumps or nodules in either breasts All other systems were reviewed with the patient and are negative.  I have reviewed the past medical history, past surgical history, social history and family history with the patient and they are unchanged from previous note.  ALLERGIES:  has No Known Allergies.  MEDICATIONS:  Current Outpatient Prescriptions  Medication Sig Dispense Refill  . anastrozole (ARIMIDEX) 1 MG tablet Take 1 tablet (1 mg total) by mouth daily. 90 tablet 3  . atenolol-chlorthalidone (TENORETIC) 50-25 MG tablet TAKE 0.5 TABLETS BY MOUTH DAILY. 45 tablet 3  . cyclobenzaprine (FLEXERIL) 5 MG tablet Take 1 tablet (5 mg total) by mouth at bedtime. 30 tablet 1  . dicyclomine (BENTYL) 20 MG tablet TAKE 1 TABLET (20 MG TOTAL) BY MOUTH 3 (THREE) TIMES DAILY BEFORE MEALS. 180 tablet 1  . furosemide (LASIX) 40 MG tablet Take 1 tablet (40 mg total) by mouth daily as needed. 30 tablet 5  . gabapentin (NEURONTIN) 300 MG capsule Take 1 capsule (300 mg total) by mouth 3 (three) times daily. 270 capsule 1  . phentermine 37.5 MG capsule Take 1 capsule (37.5 mg total) by mouth every morning. 30 capsule 3  . zolpidem (AMBIEN) 10 MG tablet Take 1 tablet (10 mg total) by mouth at bedtime as needed. 90 tablet 1   No current facility-administered medications for this visit.  PHYSICAL EXAMINATION: ECOG PERFORMANCE STATUS: 1 - Symptomatic but completely ambulatory  Vitals:   08/18/16 1151  BP: 127/79  Pulse: 74  Resp: 17  Temp: 98.4 F (36.9 C)   Filed Weights   08/18/16 1151  Weight: 221 lb 11.2 oz (100.6 kg)    GENERAL:alert, no distress and comfortable SKIN: skin color, texture, turgor are  normal, no rashes or significant lesions EYES: normal, Conjunctiva are pink and non-injected, sclera clear OROPHARYNX:no exudate, no erythema and lips, buccal mucosa, and tongue normal  NECK: supple, thyroid normal size, non-tender, without nodularity LYMPH:  no palpable lymphadenopathy in the cervical, axillary or inguinal LUNGS: clear to auscultation and percussion with normal breathing effort HEART: regular rate & rhythm and no murmurs and no lower extremity edema ABDOMEN:abdomen soft, non-tender and normal bowel sounds MUSCULOSKELETAL:no cyanosis of digits and no clubbing  NEURO: alert & oriented x 3 with fluent speech, grade 2 peripheral neuropathy EXTREMITIES: No lower extremity edema BREAST: No palpable masses or nodules in either right or left breasts. No palpable axillary supraclavicular or infraclavicular adenopathy no breast tenderness or nipple discharge. (exam performed in the presence of a chaperone)  LABORATORY DATA:  I have reviewed the data as listed   Chemistry      Component Value Date/Time   NA 139 05/26/2016 0956   NA 140 11/19/2014 1232   K 4.0 05/26/2016 0956   K 3.7 11/19/2014 1232   CL 103 05/26/2016 0956   CL 104 05/01/2013 1203   CO2 32 05/26/2016 0956   CO2 28 11/19/2014 1232   BUN 14 05/26/2016 0956   BUN 12.8 11/19/2014 1232   CREATININE 0.68 05/26/2016 0956   CREATININE 0.8 11/19/2014 1232      Component Value Date/Time   CALCIUM 9.7 05/26/2016 0956   CALCIUM 10.0 11/19/2014 1232   ALKPHOS 71 05/26/2016 0956   ALKPHOS 89 11/19/2014 1232   AST 11 05/26/2016 0956   AST 14 11/19/2014 1232   ALT 9 05/26/2016 0956   ALT 11 11/19/2014 1232   BILITOT 0.3 05/26/2016 0956   BILITOT 0.29 11/19/2014 1232       Lab Results  Component Value Date   WBC 4.7 05/26/2016   HGB 12.9 05/26/2016   HCT 38.3 05/26/2016   MCV 91.9 05/26/2016   PLT 288.0 05/26/2016   NEUTROABS 2.3 05/26/2016     ASSESSMENT & PLAN:  Breast cancer of lower-outer quadrant  of left female breast 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 x4 followed by Taxol x8 and status post radiation therapy. Currently on tamoxifen 20 mg daily started in November 2012 but she stopped taking it May 2015 due to severe hot flashes.   Current treatment: Anastrozole 1 mg daily started May 2017 (when she became postmenopausal), plan to continue to 2022 Anastrozole toxicities: 1. Hot flashes not as severe as tamoxifen 2. mild musculoskeletal discomforts   Breast Cancer Surveillance: 1. Breast exam 11/12/2015: Normal 2. Mammogram January 2017 No abnormalities. Postsurgical changes. Breast Density Category B. I recommended that she get 3-D mammograms for surveillance. Discussed the differences between different breast density categories. No orders of the defined types were placed in this encounter.  The patient has a good understanding of the overall plan. she agrees with it. she will call with any problems that may develop before the next visit here.   Rulon Eisenmenger, MD 08/18/16

## 2016-08-24 ENCOUNTER — Other Ambulatory Visit: Payer: Self-pay | Admitting: Family Medicine

## 2016-09-09 ENCOUNTER — Other Ambulatory Visit: Payer: Self-pay | Admitting: Family Medicine

## 2016-09-14 ENCOUNTER — Other Ambulatory Visit: Payer: Self-pay | Admitting: Family Medicine

## 2016-10-07 ENCOUNTER — Other Ambulatory Visit: Payer: Self-pay | Admitting: Family Medicine

## 2016-10-07 NOTE — Telephone Encounter (Signed)
Last OV 08/05/16 Flexeril last filled 09/10/16 #30 with 0

## 2016-11-06 ENCOUNTER — Encounter: Payer: Self-pay | Admitting: Family Medicine

## 2016-11-06 MED ORDER — FUROSEMIDE 40 MG PO TABS: 40.0000 mg | ORAL_TABLET | Freq: Every day | ORAL | 3 refills | Status: DC

## 2016-11-13 ENCOUNTER — Other Ambulatory Visit: Payer: Self-pay | Admitting: Family Medicine

## 2016-11-23 ENCOUNTER — Ambulatory Visit: Payer: BLUE CROSS/BLUE SHIELD | Admitting: Family Medicine

## 2016-11-25 ENCOUNTER — Ambulatory Visit: Payer: BLUE CROSS/BLUE SHIELD | Admitting: Family Medicine

## 2016-11-26 ENCOUNTER — Encounter: Payer: Self-pay | Admitting: Family Medicine

## 2016-11-27 MED ORDER — ZOLPIDEM TARTRATE 10 MG PO TABS: 10.0000 mg | ORAL_TABLET | Freq: Every evening | ORAL | 1 refills | Status: DC | PRN

## 2016-11-27 NOTE — Telephone Encounter (Signed)
Last OV 08/05/16 ambien last filled 05/26/16 #90 with 1  Moderate risk

## 2016-11-27 NOTE — Telephone Encounter (Signed)
Med phoned in to pharmacy 

## 2016-12-04 ENCOUNTER — Ambulatory Visit: Payer: BLUE CROSS/BLUE SHIELD | Admitting: Family Medicine

## 2016-12-20 ENCOUNTER — Other Ambulatory Visit: Payer: Self-pay | Admitting: Family Medicine

## 2017-01-04 ENCOUNTER — Other Ambulatory Visit: Payer: Self-pay | Admitting: General Practice

## 2017-01-04 MED ORDER — FUROSEMIDE 40 MG PO TABS: 40.0000 mg | ORAL_TABLET | Freq: Every day | ORAL | 0 refills | Status: DC

## 2017-01-07 ENCOUNTER — Other Ambulatory Visit: Payer: Self-pay | Admitting: Hematology and Oncology

## 2017-01-07 DIAGNOSIS — Z853 Personal history of malignant neoplasm of breast: Secondary | ICD-10-CM

## 2017-01-14 ENCOUNTER — Encounter: Payer: Self-pay | Admitting: Family Medicine

## 2017-01-15 ENCOUNTER — Ambulatory Visit: Payer: BLUE CROSS/BLUE SHIELD | Admitting: Family Medicine

## 2017-01-20 ENCOUNTER — Ambulatory Visit (INDEPENDENT_AMBULATORY_CARE_PROVIDER_SITE_OTHER): Payer: BLUE CROSS/BLUE SHIELD | Admitting: Family Medicine

## 2017-01-20 ENCOUNTER — Encounter: Payer: Self-pay | Admitting: Family Medicine

## 2017-01-20 VITALS — BP 126/80 | HR 89 | Temp 98.0°F | Resp 17 | Ht 63.0 in | Wt 223.5 lb

## 2017-01-20 DIAGNOSIS — M25511 Pain in right shoulder: Secondary | ICD-10-CM

## 2017-01-20 DIAGNOSIS — G620 Drug-induced polyneuropathy: Secondary | ICD-10-CM | POA: Diagnosis not present

## 2017-01-20 MED ORDER — GABAPENTIN 400 MG PO CAPS: 400.0000 mg | ORAL_CAPSULE | Freq: Three times a day (TID) | ORAL | 3 refills | Status: DC

## 2017-01-20 NOTE — Progress Notes (Signed)
   Subjective:    Patient ID: [name], female    DOB: April 23, 1961, 56 y.o.   MRN: [id number]  HPI Shoulder pain- R sided, now having difficulty sleeping.  Pain has been present 'for awhile' but she has been 'ignoring it'.  R hand dominant.  No known injury.    Neuropathy- ongoing issue for pt.  On Gabapentin 300mg  TID.  Tried Lyrica previously but this caused some GI side effects.  Neuropathy is due to her chemo.   Review of Systems For ROS see HPI     Objective:   Physical Exam  Constitutional: She is oriented to person, place, and time. She appears well-developed and well-nourished. No distress.  overweight  Cardiovascular: Intact distal pulses.   Musculoskeletal: She exhibits no edema, tenderness (no TTP over bursa, biceps tendon, clavicle, AC joint) or deformity.  Full ROM of R shoulder + impingement signs on R  Neurological: She is alert and oriented to person, place, and time. She has normal reflexes. Coordination normal.  Vitals reviewed.         Assessment & Plan:  R shoulder pain- new.  Pt w/ impingement signs.  Refer to ortho for complete evaluation and tx.  Pt expressed understanding and is in agreement w/ plan.

## 2017-01-20 NOTE — Progress Notes (Signed)
Pre visit review using our clinic review tool, if applicable. No additional management support is needed unless otherwise documented below in the visit note. 

## 2017-01-20 NOTE — Patient Instructions (Signed)
Schedule your complete physical after 5/30 Increase the Gabapentin to 400mg  3x/day (new prescription sent) We'll call you with your Ortho appt for the shoulder pain Alternate ice/heat for the shoulder (or whichever feels better) Call with any questions or concerns Hang in there!!!

## 2017-01-20 NOTE — Assessment & Plan Note (Signed)
Deteriorated.  Will increase the Gabapentin to 400mg  TID.  Pt expressed understanding and is in agreement w/ plan.

## 2017-01-21 ENCOUNTER — Other Ambulatory Visit: Payer: Self-pay | Admitting: Family Medicine

## 2017-01-25 ENCOUNTER — Other Ambulatory Visit: Payer: Self-pay | Admitting: Hematology and Oncology

## 2017-01-25 ENCOUNTER — Ambulatory Visit
Admission: RE | Admit: 2017-01-25 | Discharge: 2017-01-25 | Disposition: A | Discharge status: Home / Self Care | Payer: BLUE CROSS/BLUE SHIELD | Source: Ambulatory Visit | Attending: Hematology and Oncology | Admitting: Hematology and Oncology

## 2017-01-25 DIAGNOSIS — R921 Mammographic calcification found on diagnostic imaging of breast: Secondary | ICD-10-CM

## 2017-01-25 DIAGNOSIS — Z853 Personal history of malignant neoplasm of breast: Secondary | ICD-10-CM

## 2017-01-27 ENCOUNTER — Ambulatory Visit
Admission: RE | Admit: 2017-01-27 | Discharge: 2017-01-27 | Disposition: A | Discharge status: Home / Self Care | Payer: BLUE CROSS/BLUE SHIELD | Source: Ambulatory Visit | Attending: Hematology and Oncology | Admitting: Hematology and Oncology

## 2017-01-27 ENCOUNTER — Other Ambulatory Visit: Payer: Self-pay | Admitting: Hematology and Oncology

## 2017-01-27 DIAGNOSIS — R921 Mammographic calcification found on diagnostic imaging of breast: Secondary | ICD-10-CM

## 2017-02-18 ENCOUNTER — Other Ambulatory Visit: Payer: Self-pay | Admitting: Family Medicine

## 2017-03-02 ENCOUNTER — Other Ambulatory Visit: Payer: Self-pay | Admitting: Family Medicine

## 2017-03-14 ENCOUNTER — Other Ambulatory Visit: Payer: Self-pay | Admitting: Family Medicine

## 2017-03-14 DIAGNOSIS — C50512 Malignant neoplasm of lower-outer quadrant of left female breast: Secondary | ICD-10-CM

## 2017-03-29 ENCOUNTER — Other Ambulatory Visit: Payer: Self-pay | Admitting: Family Medicine

## 2017-04-30 ENCOUNTER — Other Ambulatory Visit: Payer: Self-pay | Admitting: Family Medicine

## 2017-06-01 ENCOUNTER — Other Ambulatory Visit: Payer: Self-pay | Admitting: Family Medicine

## 2017-06-04 ENCOUNTER — Telehealth: Payer: Self-pay | Admitting: *Deleted

## 2017-06-04 MED ORDER — ZOLPIDEM TARTRATE 10 MG PO TABS: 10.0000 mg | ORAL_TABLET | Freq: Every evening | ORAL | 0 refills | Status: DC | PRN

## 2017-06-04 NOTE — Telephone Encounter (Signed)
Last OV 01/20/17 (neuropathy) ambien last filled 11/27/16 #90 with 1

## 2017-06-04 NOTE — Telephone Encounter (Signed)
Medication filled to pharmacy as requested.   

## 2017-06-04 NOTE — Telephone Encounter (Signed)
Patient calling to see if we can refill her ambien.  It is out of refills and she could not get in for her CPE until September.   She is wanted to see if she can get at least enough to last her until September.

## 2017-06-04 NOTE — Telephone Encounter (Signed)
Ok for #90, no refills 

## 2017-06-30 ENCOUNTER — Other Ambulatory Visit: Payer: Self-pay | Admitting: Family Medicine

## 2017-07-01 ENCOUNTER — Other Ambulatory Visit: Payer: Self-pay | Admitting: Family Medicine

## 2017-08-01 ENCOUNTER — Other Ambulatory Visit: Payer: Self-pay | Admitting: Family Medicine

## 2017-08-16 ENCOUNTER — Other Ambulatory Visit: Payer: Self-pay | Admitting: Family Medicine

## 2017-08-18 ENCOUNTER — Encounter: Payer: Self-pay | Admitting: Hematology and Oncology

## 2017-08-18 ENCOUNTER — Ambulatory Visit (HOSPITAL_BASED_OUTPATIENT_CLINIC_OR_DEPARTMENT_OTHER): Payer: BLUE CROSS/BLUE SHIELD | Admitting: Hematology and Oncology

## 2017-08-18 DIAGNOSIS — N951 Menopausal and female climacteric states: Secondary | ICD-10-CM | POA: Diagnosis not present

## 2017-08-18 DIAGNOSIS — Z170 Estrogen receptor positive status [ER+]: Secondary | ICD-10-CM | POA: Diagnosis not present

## 2017-08-18 DIAGNOSIS — C50512 Malignant neoplasm of lower-outer quadrant of left female breast: Secondary | ICD-10-CM | POA: Diagnosis not present

## 2017-08-18 MED ORDER — DICLOFENAC SODIUM 1 % TD GEL: 2.0000 g | Freq: Four times a day (QID) | TRANSDERMAL | 3 refills | Status: DC

## 2017-08-18 NOTE — Assessment & Plan Note (Signed)
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 x4 followed by Taxol x8 and status post radiation therapy. Currently on tamoxifen 20 mg daily started in November 2012 but she stopped taking it May 2015 due to severe hot flashes.   Current treatment: Anastrozole 1 mg daily started May 2017 (when she became postmenopausal), plan to continue to 2022 Anastrozole toxicities: 1. Hot flashes not as severe as tamoxifen 2. mild musculoskeletal discomforts   Breast Cancer Surveillance: 1. Breast exam 08/20/2017: No palpable lumps or nodules of concern 2. Mammogram: 01/25/2017: Calcifications spanning 11 x 5 x 4 mm biopsy revealed benign findings   Return to clinic in 1 year for follow-up

## 2017-08-18 NOTE — Progress Notes (Signed)
Patient Care Team: Midge Minium, MD as PCP - General Servando Salina, MD as Consulting Physician (Obstetrics and Gynecology) Nicholas Lose, MD as Consulting Physician (Hematology and Oncology)  DIAGNOSIS:  Encounter Diagnosis  Name Primary?  . Malignant neoplasm of lower-outer quadrant of left breast of female, estrogen receptor positive ([place])     SUMMARY OF ONCOLOGIC HISTORY:   Breast cancer of lower-outer quadrant of left female breast ([place])   01/20/2011 Surgery    Left breast lumpectomy: Invasive lobular carcinoma multiple foci 1.2 and 0.7 cm posterior margin and LCIS, micro met in one SLN, ER 100%, PR 100%, Ki-67 12%, HER-2 negative ratio 1.11: Oncotype DX 22, ROR14%      03/17/2011 - 07/17/2011 Chemotherapy    Adjuvant chemotherapy AC x4 followed by Taxol weekly x8      08/28/2011 - 10/13/2011 Radiation Therapy    Adjuvant radiation therapy      10/29/2011 -  Anti-estrogen oral therapy    Adjuvant tamoxifen 20 mg daily, stopped May 2016 started on anastrozole in May 2017 (and she became postmenopausal), plan of treatment 5 years       CHIEF COMPLIANT: Follow-up on anastrozole therapy  INTERVAL HISTORY: [name] is a 56 year old with above-mentioned history left breast cancer currently on adjuvant antiestrogen therapy with anastrozole. She resumed her antiestrogen therapy and may have to thousand 17. She continues to have muscle aches and pains and stiffness related to the anastrozole treatment. However she feels that it is much better than tamoxifen and is willing to put up with it. She had abnormalities on the mammograms which were biopsy-proven to be benign findings. She continues to have intermittent hot flashes.  REVIEW OF SYSTEMS:   Constitutional: Denies fevers, chills or abnormal weight loss Eyes: Denies blurriness of vision Ears, nose, mouth, throat, and face: Denies mucositis or sore throat Respiratory: Denies cough, dyspnea or  wheezes Cardiovascular: Denies palpitation, chest discomfort Gastrointestinal:  Denies nausea, heartburn or change in bowel habits Skin: Denies abnormal skin rashes Lymphatics: Denies new lymphadenopathy or easy bruising Neurological:Denies numbness, tingling or new weaknesses Behavioral/Psych: Mood is stable, no new changes  Extremities: No lower extremity edema Breast:  denies any pain or lumps or nodules in either breasts All other systems were reviewed with the patient and are negative.  I have reviewed the past medical history, past surgical history, social history and family history with the patient and they are unchanged from previous note.  ALLERGIES:  has No Known Allergies.  MEDICATIONS:  Current Outpatient Prescriptions  Medication Sig Dispense Refill  . anastrozole (ARIMIDEX) 1 MG tablet Take 1 tablet (1 mg total) by mouth daily. 90 tablet 3  . atenolol-chlorthalidone (TENORETIC) 50-25 MG tablet TAKE 0.5 TABLETS BY MOUTH DAILY. 45 tablet 2  . cyclobenzaprine (FLEXERIL) 5 MG tablet TAKE 1 TABLET (5 MG TOTAL) BY MOUTH AT BEDTIME. 30 tablet 0  . diclofenac sodium (VOLTAREN) 1 % GEL Apply 2 g topically 4 (four) times daily. 100 g 3  . dicyclomine (BENTYL) 20 MG tablet TAKE 1 TABLET (20 MG TOTAL) BY MOUTH 3 (THREE) TIMES DAILY BEFORE MEALS. 180 tablet 1  . furosemide (LASIX) 40 MG tablet TAKE 1 TABLET (40 MG TOTAL) BY MOUTH DAILY. 90 tablet 0  . gabapentin (NEURONTIN) 300 MG capsule TAKE 1 CAPSULE (300 MG TOTAL) BY MOUTH 3 (THREE) TIMES DAILY. 270 capsule 1  . gabapentin (NEURONTIN) 400 MG capsule TAKE 1 CAPSULE BY MOUTH 3 TIMES DAILY. 90 capsule 3  . phentermine (ADIPEX-P) 37.5  MG tablet     . zolpidem (AMBIEN) 10 MG tablet Take 1 tablet (10 mg total) by mouth at bedtime as needed. 90 tablet 0   No current facility-administered medications for this visit.     PHYSICAL EXAMINATION: ECOG PERFORMANCE STATUS: 1 - Symptomatic but completely ambulatory  Vitals:   08/18/17 1205   BP: 127/60  Pulse: 74  Resp: 18  Temp: 99.1 F (37.3 C)  SpO2: 97%   Filed Weights   08/18/17 1205  Weight: 215 lb 1.6 oz (97.6 kg)    GENERAL:alert, no distress and comfortable SKIN: skin color, texture, turgor are normal, no rashes or significant lesions EYES: normal, Conjunctiva are pink and non-injected, sclera clear OROPHARYNX:no exudate, no erythema and lips, buccal mucosa, and tongue normal  NECK: supple, thyroid normal size, non-tender, without nodularity LYMPH:  no palpable lymphadenopathy in the cervical, axillary or inguinal LUNGS: clear to auscultation and percussion with normal breathing effort HEART: regular rate & rhythm and no murmurs and no lower extremity edema ABDOMEN:abdomen soft, non-tender and normal bowel sounds MUSCULOSKELETAL:no cyanosis of digits and no clubbing  NEURO: alert & oriented x 3 with fluent speech, no focal motor/sensory deficits EXTREMITIES: No lower extremity edema   LABORATORY DATA:  I have reviewed the data as listed   Chemistry      Component Value Date/Time   NA 139 05/26/2016 0956   NA 140 11/19/2014 1232   K 4.0 05/26/2016 0956   K 3.7 11/19/2014 1232   CL 103 05/26/2016 0956   CL 104 05/01/2013 1203   CO2 32 05/26/2016 0956   CO2 28 11/19/2014 1232   BUN 14 05/26/2016 0956   BUN 12.8 11/19/2014 1232   CREATININE 0.68 05/26/2016 0956   CREATININE 0.8 11/19/2014 1232      Component Value Date/Time   CALCIUM 9.7 05/26/2016 0956   CALCIUM 10.0 11/19/2014 1232   ALKPHOS 71 05/26/2016 0956   ALKPHOS 89 11/19/2014 1232   AST 11 05/26/2016 0956   AST 14 11/19/2014 1232   ALT 9 05/26/2016 0956   ALT 11 11/19/2014 1232   BILITOT 0.3 05/26/2016 0956   BILITOT 0.29 11/19/2014 1232       Lab Results  Component Value Date   WBC 4.7 05/26/2016   HGB 12.9 05/26/2016   HCT 38.3 05/26/2016   MCV 91.9 05/26/2016   PLT 288.0 05/26/2016   NEUTROABS 2.3 05/26/2016    ASSESSMENT & PLAN:  Breast cancer of lower-outer  quadrant of left female breast Left breast cancer stage II invasive lobular with one positive lymph node for micrometastatic disease status post lumpectomy followed by adjuvant chemotherapy with a.c. x4 followed by Taxol x8 and status post radiation therapy. Currently on tamoxifen 20 mg daily started in November 2012 but she stopped taking it May 2015 due to severe hot flashes.   Current treatment: Anastrozole 1 mg daily started May 2017 (when she became postmenopausal), plan to continue to 2022 Anastrozole toxicities: 1. Hot flashes not as severe as tamoxifen 2. mild musculoskeletal discomforts   Breast Cancer Surveillance: 1. Breast exam 08/18/2017: No palpable lumps or nodules of concern 2. Mammogram: 01/25/2017: Calcifications spanning 11 x 5 x 4 mm biopsy revealed benign findings   Return to clinic in 1 year for follow-up  I spent 15 minutes talking to the patient of which more than half was spent in counseling and coordination of care.  Orders Placed This Encounter  Procedures  . Ambulatory referral to Physical Therapy      Referral Priority:   Routine    Referral Type:   Physical Medicine    Referral Reason:   Specialty Services Required    Requested Specialty:   Physical Therapy    Number of Visits Requested:   1   The patient has a good understanding of the overall plan. she agrees with it. she will call with any problems that may develop before the next visit here.   Rulon Eisenmenger, MD 08/18/17

## 2017-08-19 ENCOUNTER — Encounter: Payer: Self-pay | Admitting: Hematology and Oncology

## 2017-08-19 NOTE — Progress Notes (Signed)
Submitted auth request for Diclofenac Sodium today and it was approved.

## 2017-08-19 NOTE — Progress Notes (Signed)
Approval for Diclofenac Sodium is from 07/20/17 to 08/19/18.

## 2017-09-01 ENCOUNTER — Ambulatory Visit: Payer: BLUE CROSS/BLUE SHIELD | Admitting: Physical Therapy

## 2017-09-03 ENCOUNTER — Encounter: Payer: Self-pay | Admitting: Family Medicine

## 2017-09-03 ENCOUNTER — Ambulatory Visit (INDEPENDENT_AMBULATORY_CARE_PROVIDER_SITE_OTHER): Payer: BLUE CROSS/BLUE SHIELD | Admitting: Family Medicine

## 2017-09-03 ENCOUNTER — Other Ambulatory Visit: Payer: Self-pay | Admitting: General Practice

## 2017-09-03 VITALS — BP 123/76 | HR 80 | Temp 98.0°F | Resp 16 | Ht 63.0 in | Wt 216.4 lb

## 2017-09-03 DIAGNOSIS — Z0000 Encounter for general adult medical examination without abnormal findings: Secondary | ICD-10-CM | POA: Diagnosis not present

## 2017-09-03 DIAGNOSIS — E559 Vitamin D deficiency, unspecified: Secondary | ICD-10-CM | POA: Diagnosis not present

## 2017-09-03 DIAGNOSIS — I10 Essential (primary) hypertension: Secondary | ICD-10-CM | POA: Diagnosis not present

## 2017-09-03 LAB — BASIC METABOLIC PANEL
BUN: 16 mg/dL (ref 6–23)
CO2: 32 mEq/L (ref 19–32)
Calcium: 10.2 mg/dL (ref 8.4–10.5)
Chloride: 99 mEq/L (ref 96–112)
Creatinine, Ser: 0.72 mg/dL (ref 0.40–1.20)
GFR: 108.07 mL/min (ref >60.00)
Glucose, Bld: 110 mg/dL — ABNORMAL HIGH (ref 70–99)
POTASSIUM: 4.3 meq/L (ref 3.5–5.1)
SODIUM: 139 meq/L (ref 135–145)

## 2017-09-03 LAB — HEPATIC FUNCTION PANEL
ALK PHOS: 90 U/L (ref 39–117)
ALT: 18 U/L (ref 0–35)
AST: 14 U/L (ref 0–37)
Albumin: 4.3 g/dL (ref 3.5–5.2)
BILIRUBIN DIRECT: 0.1 mg/dL (ref 0.0–0.3)
BILIRUBIN TOTAL: 0.4 mg/dL (ref 0.2–1.2)
Total Protein: 7.4 g/dL (ref 6.0–8.3)

## 2017-09-03 LAB — LIPID PANEL
CHOL/HDL RATIO: 5
CHOLESTEROL: 214 mg/dL — AB (ref 0–200)
HDL: 47.3 mg/dL (ref >39.00)
LDL CALC: 145 mg/dL — AB (ref 0–99)
NONHDL: 166.85
Triglycerides: 107 mg/dL (ref 0.0–149.0)
VLDL: 21.4 mg/dL (ref 0.0–40.0)

## 2017-09-03 LAB — CBC WITH DIFFERENTIAL/PLATELET
Basophils Absolute: 0 10*3/uL (ref 0.0–0.1)
Basophils Relative: 0.8 % (ref 0.0–3.0)
EOS PCT: 2.2 % (ref 0.0–5.0)
Eosinophils Absolute: 0.1 10*3/uL (ref 0.0–0.7)
HEMATOCRIT: 39.5 % (ref 36.0–46.0)
HEMOGLOBIN: 13.3 g/dL (ref 12.0–15.0)
LYMPHS ABS: 1.9 10*3/uL (ref 0.7–4.0)
LYMPHS PCT: 38.9 % (ref 12.0–46.0)
MCHC: 33.6 g/dL (ref 30.0–36.0)
MCV: 93.4 fl (ref 78.0–100.0)
MONOS PCT: 7.5 % (ref 3.0–12.0)
Monocytes Absolute: 0.4 10*3/uL (ref 0.1–1.0)
NEUTROS ABS: 2.4 10*3/uL (ref 1.4–7.7)
Neutrophils Relative %: 50.6 % (ref 43.0–77.0)
Platelets: 302 10*3/uL (ref 150.0–400.0)
RBC: 4.23 Mil/uL (ref 3.87–5.11)
RDW: 13.1 % (ref 11.5–15.5)
WBC: 4.8 10*3/uL (ref 4.0–10.5)

## 2017-09-03 LAB — TSH: TSH: 1.11 u[IU]/mL (ref 0.35–4.50)

## 2017-09-03 LAB — VITAMIN D 25 HYDROXY (VIT D DEFICIENCY, FRACTURES): VITD: 28.73 ng/mL — AB (ref 30.00–100.00)

## 2017-09-03 MED ORDER — PHENTERMINE HCL 37.5 MG PO TABS: 37.5000 mg | ORAL_TABLET | Freq: Every day | ORAL | 2 refills | Status: DC

## 2017-09-03 MED ORDER — ZOLPIDEM TARTRATE 10 MG PO TABS: 10.0000 mg | ORAL_TABLET | Freq: Every evening | ORAL | 0 refills | Status: DC | PRN

## 2017-09-03 NOTE — Assessment & Plan Note (Signed)
Pt's PE WNL w/ exception of obesity.  UTD on mammo, colonoscopy.  Declines flu.  Check labs.  Anticipatory guidance provided.

## 2017-09-03 NOTE — Assessment & Plan Note (Signed)
Check labs.  Replete prn. 

## 2017-09-03 NOTE — Progress Notes (Signed)
   Subjective:    Patient ID: [name], female    DOB: October 23, 1961, 56 y.o.   MRN: [id number]  HPI CPE- UTD on colonoscopy, mammo.  No need for pap due to hysterectomy.   Review of Systems Patient reports no vision/ hearing changes, adenopathy,fever, weight change,  persistant/recurrent hoarseness , swallowing issues, chest pain, palpitations, edema, persistant/recurrent cough, hemoptysis, dyspnea (rest/exertional/paroxysmal nocturnal), gastrointestinal bleeding (melena, rectal bleeding), abdominal pain, significant heartburn, bowel changes, GU symptoms (dysuria, hematuria, incontinence), Gyn symptoms (abnormal  bleeding, pain),  syncope, focal weakness, memory loss, skin/hair/nail changes, abnormal bruising or bleeding, anxiety, or depression.   + chemo neuropathy    Objective:   Physical Exam General Appearance:    Alert, cooperative, no distress, appears stated age  Head:    Normocephalic, without obvious abnormality, atraumatic  Eyes:    PERRL, conjunctiva/corneas clear, EOM's intact, fundi    benign, both eyes  Ears:    Normal TM's and external ear canals, both ears  Nose:   Nares normal, septum midline, mucosa normal, no drainage    or sinus tenderness  Throat:   Lips, mucosa, and tongue normal; teeth and gums normal  Neck:   Supple, symmetrical, trachea midline, no adenopathy;    Thyroid: no enlargement/tenderness/nodules  Back:     Symmetric, no curvature, ROM normal, no CVA tenderness  Lungs:     Clear to auscultation bilaterally, respirations unlabored  Chest Wall:    No tenderness or deformity   Heart:    Regular rate and rhythm, S1 and S2 normal, no murmur, rub   or gallop  Breast Exam:    Deferred to GYN  Abdomen:     Soft, non-tender, bowel sounds active all four quadrants,    no masses, no organomegaly  Genitalia:    Deferred to GYN  Rectal:    Extremities:   Extremities normal, atraumatic, no cyanosis or edema  Pulses:   2+ and symmetric all extremities  Skin:    Skin color, texture, turgor normal, no rashes or lesions  Lymph nodes:   Cervical, supraclavicular, and axillary nodes normal  Neurologic:   CNII-XII intact, normal strength, sensation and reflexes    throughout          Assessment & Plan:

## 2017-09-03 NOTE — Assessment & Plan Note (Signed)
Chronic problem.  Well controlled.  Asymptomatic.  Check labs.  No anticipated med changes. 

## 2017-09-03 NOTE — Patient Instructions (Signed)
Follow up in 6 months to recheck BP and cholesterol We'll notify you of your lab results and make any changes if needed Continue to work on healthy diet and regular exercise- you can do it!!! You are up to date on mammogram until Feb- yay! You are up to date on colonoscopy until 2023! Call with any questions or concerns Happy Fall!!!

## 2017-09-03 NOTE — Progress Notes (Signed)
Pre visit review using our clinic review tool, if applicable. No additional management support is needed unless otherwise documented below in the visit note. 

## 2017-09-06 ENCOUNTER — Other Ambulatory Visit (INDEPENDENT_AMBULATORY_CARE_PROVIDER_SITE_OTHER): Payer: BLUE CROSS/BLUE SHIELD

## 2017-09-06 DIAGNOSIS — R7309 Other abnormal glucose: Secondary | ICD-10-CM

## 2017-09-06 LAB — HEMOGLOBIN A1C: Hgb A1c MFr Bld: 6.1 % (ref 4.6–6.5)

## 2017-09-07 NOTE — Progress Notes (Signed)
Called pt and lmovm to return call.

## 2017-09-08 ENCOUNTER — Other Ambulatory Visit: Payer: Self-pay | Admitting: General Practice

## 2017-09-08 MED ORDER — VITAMIN D (ERGOCALCIFEROL) 1.25 MG (50000 UNIT) PO CAPS: 50000.0000 [IU] | ORAL_CAPSULE | ORAL | 0 refills | Status: DC

## 2017-09-18 ENCOUNTER — Other Ambulatory Visit: Payer: Self-pay | Admitting: Family Medicine

## 2017-09-18 DIAGNOSIS — C50512 Malignant neoplasm of lower-outer quadrant of left female breast: Secondary | ICD-10-CM

## 2017-09-21 ENCOUNTER — Ambulatory Visit: Payer: BLUE CROSS/BLUE SHIELD | Attending: Hematology and Oncology | Admitting: Physical Therapy

## 2017-09-21 DIAGNOSIS — R209 Unspecified disturbances of skin sensation: Secondary | ICD-10-CM | POA: Insufficient documentation

## 2017-09-21 NOTE — Therapy (Signed)
[place], [place], [address] Phone: [phone]   Fax:  [phone]  Patient Details  Name: [name] MRN: [id number] Date of Birth: Mar 20, 1961 Referring Provider:  Nicholas Lose, MD  Encounter Date: 09/21/2017  Pt came to clinic today seeking a consultation to see what, if anything, we could do for her chemotherapy induced peripheral neuropathy.  She did not want to do a full evaluation.   Pt has been dealing with her neuropathy for several years and is knowledgeable about the topic. I described possible treatment as hot packs, massage, contrast baths, TENS (and showed her a unit) and strengthening exercise especially to ankles and hips.  Encouraged  her to do daily exercise and to contact [facility] for possible support groups with others who are experiencing the same thing.  Gave pt information about exercise classes at the [facility] that are of low cost.   Pt decided not to continue with PT evaluation at this time, but will consider it for the future.  There is no charge for this visit.  Norwood Levo 09/21/2017, 3:15 PM  [place], [place], [address] Phone: [phone]   Fax:  [phone]

## 2017-09-24 ENCOUNTER — Other Ambulatory Visit: Payer: Self-pay | Admitting: Family Medicine

## 2017-10-01 ENCOUNTER — Other Ambulatory Visit: Payer: Self-pay | Admitting: Family Medicine

## 2017-10-10 ENCOUNTER — Other Ambulatory Visit: Payer: Self-pay | Admitting: Family Medicine

## 2017-10-15 ENCOUNTER — Other Ambulatory Visit: Payer: Self-pay | Admitting: General Practice

## 2017-10-15 MED ORDER — GABAPENTIN 400 MG PO CAPS: 400.0000 mg | ORAL_CAPSULE | Freq: Three times a day (TID) | ORAL | 1 refills | Status: DC

## 2017-11-17 ENCOUNTER — Other Ambulatory Visit: Payer: Self-pay

## 2017-11-17 ENCOUNTER — Encounter: Payer: Self-pay | Admitting: Family Medicine

## 2017-11-17 ENCOUNTER — Ambulatory Visit: Payer: BLUE CROSS/BLUE SHIELD | Admitting: Family Medicine

## 2017-11-17 VITALS — BP 124/78 | HR 86 | Temp 98.1°F | Resp 16 | Ht 63.0 in | Wt 217.5 lb

## 2017-11-17 DIAGNOSIS — M79675 Pain in left toe(s): Secondary | ICD-10-CM

## 2017-11-17 NOTE — Progress Notes (Signed)
   Subjective:    Patient ID: SAMONE GUHL, female    DOB: 10/21/1961, 56 y.o.   MRN: [id number]  HPI Toenail issue- L great toenail is painful.  sxs started 5-6 weeks ago.  No recent pedicure (June was last pedicure)  No drainage or oozing.     Review of Systems  For ROS see HPI     Objective:   Physical Exam  Constitutional: She appears well-developed and well-nourished. No distress.  Skin: Skin is warm and dry.  L great toenail is loose in the nail bed.  No redness, evidence of paronychia or ingrown edges.  TTP over top of nail rather than laterally.  Nail is lifting up superiorly from nail plate.  Vitals reviewed.         Assessment & Plan:  Nail pain- refer to podiatry for possible nail removal and treatment.  No obvious ingrown nail or cellulitis to treat.  Pt asked for abx anyway- told her at this time, I was not comfortable with that as we don't know what we are treating.  Pt expressed understanding and is in agreement w/ plan.

## 2017-11-17 NOTE — Patient Instructions (Signed)
Follow up as needed or as scheduled We'll call you with your Podiatry appt Soak your foot in Epsom salt and dry completely Call with any questions or concerns Hang in there!!! Happy Thanksgiving!

## 2017-11-24 ENCOUNTER — Other Ambulatory Visit: Payer: Self-pay | Admitting: General Practice

## 2017-11-24 MED ORDER — ATENOLOL-CHLORTHALIDONE 50-25 MG PO TABS: ORAL_TABLET | ORAL | 2 refills | Status: DC

## 2017-11-26 ENCOUNTER — Other Ambulatory Visit: Payer: Self-pay | Admitting: General Practice

## 2017-11-26 MED ORDER — ATENOLOL-CHLORTHALIDONE 50-25 MG PO TABS: ORAL_TABLET | ORAL | 2 refills | Status: DC

## 2017-11-27 ENCOUNTER — Other Ambulatory Visit: Payer: Self-pay | Admitting: Family Medicine

## 2017-11-29 ENCOUNTER — Telehealth: Payer: Self-pay | Admitting: Family Medicine

## 2017-11-29 ENCOUNTER — Other Ambulatory Visit: Payer: Self-pay | Admitting: Family Medicine

## 2017-11-29 MED ORDER — CYCLOBENZAPRINE HCL 5 MG PO TABS: 5.0000 mg | ORAL_TABLET | Freq: Every day | ORAL | 0 refills | Status: DC

## 2017-11-29 NOTE — Telephone Encounter (Signed)
Last OV 11/17/17 Flexeril last filled 09/27/17 #30 with 0

## 2017-11-29 NOTE — Telephone Encounter (Signed)
Medication filled to pharmacy as requested.   [place] for [facility] to Discuss results / PCP recommendations / Schedule patient.

## 2017-11-29 NOTE — Telephone Encounter (Signed)
Last OV 11/17/17 Toenail pain  Zolpidem last filled 09/03/17 #90 with 0

## 2017-11-30 NOTE — Telephone Encounter (Signed)
If rx is denied we need to provide a reason to the patient. Please send a MyChart message or call the patient. Thank you!

## 2017-11-30 NOTE — Telephone Encounter (Signed)
Patient checking status of ambien 10mg ?

## 2017-12-01 NOTE — Telephone Encounter (Signed)
Initial medication request was approved (11/27/17 #90 with 0), then a second one was called to the pharmacy (11/29/17 #90 with 0)  , all subsequent requests were denied (there were 3 more). Called and spoke with pharmacy this morning. Pt picked up her prescription yesterday.

## 2017-12-09 ENCOUNTER — Other Ambulatory Visit: Payer: Self-pay | Admitting: Family Medicine

## 2017-12-14 ENCOUNTER — Other Ambulatory Visit: Payer: Self-pay | Admitting: Hematology and Oncology

## 2017-12-14 DIAGNOSIS — Z9889 Other specified postprocedural states: Secondary | ICD-10-CM

## 2017-12-27 ENCOUNTER — Other Ambulatory Visit: Payer: Self-pay | Admitting: General Practice

## 2017-12-27 MED ORDER — FUROSEMIDE 40 MG PO TABS: 40.0000 mg | ORAL_TABLET | Freq: Every day | ORAL | 0 refills | Status: DC

## 2018-01-01 ENCOUNTER — Other Ambulatory Visit: Payer: Self-pay | Admitting: Hematology and Oncology

## 2018-01-01 DIAGNOSIS — C50512 Malignant neoplasm of lower-outer quadrant of left female breast: Secondary | ICD-10-CM

## 2018-01-10 ENCOUNTER — Other Ambulatory Visit: Payer: Self-pay | Admitting: Family Medicine

## 2018-01-27 ENCOUNTER — Ambulatory Visit
Admission: RE | Admit: 2018-01-27 | Discharge: 2018-01-27 | Disposition: A | Discharge status: Home / Self Care | Payer: BLUE CROSS/BLUE SHIELD | Source: Ambulatory Visit | Attending: Hematology and Oncology | Admitting: Hematology and Oncology

## 2018-01-27 DIAGNOSIS — Z9889 Other specified postprocedural states: Secondary | ICD-10-CM

## 2018-01-27 HISTORY — DX: Personal history of antineoplastic chemotherapy: Z92.21

## 2018-01-27 HISTORY — DX: Personal history of irradiation: Z92.3

## 2018-03-02 ENCOUNTER — Encounter: Payer: Self-pay | Admitting: Family Medicine

## 2018-03-02 ENCOUNTER — Other Ambulatory Visit: Payer: Self-pay

## 2018-03-02 ENCOUNTER — Ambulatory Visit: Payer: BLUE CROSS/BLUE SHIELD | Admitting: Family Medicine

## 2018-03-02 VITALS — BP 122/80 | HR 80 | Temp 98.1°F | Resp 16 | Ht 63.0 in | Wt 211.2 lb

## 2018-03-02 DIAGNOSIS — E785 Hyperlipidemia, unspecified: Secondary | ICD-10-CM

## 2018-03-02 DIAGNOSIS — I10 Essential (primary) hypertension: Secondary | ICD-10-CM

## 2018-03-02 LAB — BASIC METABOLIC PANEL
BUN: 15 mg/dL (ref 6–23)
CO2: 36 meq/L — AB (ref 19–32)
CREATININE: 0.84 mg/dL (ref 0.40–1.20)
Calcium: 10.3 mg/dL (ref 8.4–10.5)
Chloride: 96 mEq/L (ref 96–112)
GFR: 90.29 mL/min (ref >60.00)
GLUCOSE: 114 mg/dL — AB (ref 70–99)
Potassium: 3.7 mEq/L (ref 3.5–5.1)
Sodium: 139 mEq/L (ref 135–145)

## 2018-03-02 LAB — HEPATIC FUNCTION PANEL
ALBUMIN: 4.5 g/dL (ref 3.5–5.2)
ALK PHOS: 97 U/L (ref 39–117)
ALT: 10 U/L (ref 0–35)
AST: 13 U/L (ref 0–37)
BILIRUBIN DIRECT: 0.1 mg/dL (ref 0.0–0.3)
TOTAL PROTEIN: 7.9 g/dL (ref 6.0–8.3)
Total Bilirubin: 0.4 mg/dL (ref 0.2–1.2)

## 2018-03-02 LAB — CBC WITH DIFFERENTIAL/PLATELET
BASOS ABS: 0 10*3/uL (ref 0.0–0.1)
Basophils Relative: 0.9 % (ref 0.0–3.0)
EOS ABS: 0.1 10*3/uL (ref 0.0–0.7)
Eosinophils Relative: 1.6 % (ref 0.0–5.0)
HCT: 39.6 % (ref 36.0–46.0)
Hemoglobin: 13.6 g/dL (ref 12.0–15.0)
LYMPHS ABS: 2 10*3/uL (ref 0.7–4.0)
Lymphocytes Relative: 41.2 % (ref 12.0–46.0)
MCHC: 34.4 g/dL (ref 30.0–36.0)
MCV: 91.5 fl (ref 78.0–100.0)
MONO ABS: 0.4 10*3/uL (ref 0.1–1.0)
MONOS PCT: 7.9 % (ref 3.0–12.0)
NEUTROS ABS: 2.3 10*3/uL (ref 1.4–7.7)
NEUTROS PCT: 48.4 % (ref 43.0–77.0)
PLATELETS: 344 10*3/uL (ref 150.0–400.0)
RBC: 4.33 Mil/uL (ref 3.87–5.11)
RDW: 12.7 % (ref 11.5–15.5)
WBC: 4.9 10*3/uL (ref 4.0–10.5)

## 2018-03-02 LAB — LIPID PANEL
CHOLESTEROL: 222 mg/dL — AB (ref 0–200)
HDL: 45.3 mg/dL (ref >39.00)
LDL Cholesterol: 148 mg/dL — ABNORMAL HIGH (ref 0–99)
NONHDL: 176.57
Total CHOL/HDL Ratio: 5
Triglycerides: 142 mg/dL (ref 0.0–149.0)
VLDL: 28.4 mg/dL (ref 0.0–40.0)

## 2018-03-02 LAB — TSH: TSH: 1.04 u[IU]/mL (ref 0.35–4.50)

## 2018-03-02 MED ORDER — ZOLPIDEM TARTRATE 10 MG PO TABS: 10.0000 mg | ORAL_TABLET | Freq: Every evening | ORAL | 0 refills | Status: DC | PRN

## 2018-03-02 NOTE — Patient Instructions (Signed)
Schedule your complete physical in 6 months We'll notify you of your lab results and make any changes if needed Keep up the good work on healthy diet and regular exercise- you look great!!! Call with any questions or concerns Happy Spring!!! 

## 2018-03-02 NOTE — Assessment & Plan Note (Signed)
Chronic problem.  Attempting to control w/ diet and exercise.  Check labs.  Will revisit medication if needed

## 2018-03-02 NOTE — Assessment & Plan Note (Signed)
Pt's BMI is 37.4 but w/ her HTN and hyperlipidemia, this qualifies her as morbidly obese.  Encouraged healthy diet and regular exercise.  She is down 7 lbs, applauded her efforts.  Check labs to risk stratify.  Will follow.

## 2018-03-02 NOTE — Assessment & Plan Note (Signed)
Chronic problem.  Adequate control.  Asymptomatic.  Check labs.  No anticipated med changes.  Will follow. 

## 2018-03-02 NOTE — Progress Notes (Signed)
   Subjective:    Patient ID: [name], female    DOB: [birth year], 57 y.o.   MRN: [id number]  HPI HTN- chronic problem, on Atenolol-Chlorthalidone and Lasix w/ good control.  No CP, SOB, HAs, visual changes, edema.  Hyperlipidemia- chronic problem, attempting to control w/ diet and exercise.  Pt is down 7 lbs from last visit.  No abd pain, N/V  Obesity- pt is down 7 lbs from last visit.  Pt is walking 'a little more' and has eliminated sugary drinks.     Review of Systems For ROS see HPI     Objective:   Physical Exam  Constitutional: She is oriented to person, place, and time. She appears well-developed and well-nourished. No distress.  obese  HENT:  Head: Normocephalic and atraumatic.  Eyes: Conjunctivae and EOM are normal. Pupils are equal, round, and reactive to light.  Neck: Normal range of motion. Neck supple. No thyromegaly present.  Cardiovascular: Normal rate, regular rhythm, normal heart sounds and intact distal pulses.  No murmur heard. Pulmonary/Chest: Effort normal and breath sounds normal. No respiratory distress.  Abdominal: Soft. She exhibits no distension. There is no tenderness.  Musculoskeletal: She exhibits no edema.  Lymphadenopathy:    She has no cervical adenopathy.  Neurological: She is alert and oriented to person, place, and time.  Skin: Skin is warm and dry.  Psychiatric: She has a normal mood and affect. Her behavior is normal.  Vitals reviewed.         Assessment & Plan:

## 2018-03-03 ENCOUNTER — Other Ambulatory Visit: Payer: Self-pay | Admitting: Emergency Medicine

## 2018-03-03 DIAGNOSIS — E785 Hyperlipidemia, unspecified: Secondary | ICD-10-CM

## 2018-03-03 MED ORDER — ATORVASTATIN CALCIUM 10 MG PO TABS: 10.0000 mg | ORAL_TABLET | Freq: Every day | ORAL | 1 refills | Status: DC

## 2018-03-10 ENCOUNTER — Other Ambulatory Visit: Payer: Self-pay | Admitting: Family Medicine

## 2018-03-25 ENCOUNTER — Other Ambulatory Visit: Payer: Self-pay | Admitting: Family Medicine

## 2018-04-28 ENCOUNTER — Other Ambulatory Visit: Payer: BLUE CROSS/BLUE SHIELD

## 2018-05-19 ENCOUNTER — Telehealth: Payer: Self-pay

## 2018-05-19 NOTE — Telephone Encounter (Signed)
Called to verify appointment time that was scheduled for her. Per 5/23 phone que

## 2018-05-25 ENCOUNTER — Other Ambulatory Visit: Payer: Self-pay | Admitting: Family Medicine

## 2018-05-25 NOTE — Telephone Encounter (Signed)
Last OV 03/02/18 Zolpidem last filled 03/02/18 #90 with 0

## 2018-08-08 ENCOUNTER — Inpatient Hospital Stay: Payer: BLUE CROSS/BLUE SHIELD | Attending: Hematology and Oncology | Admitting: Hematology and Oncology

## 2018-08-08 ENCOUNTER — Telehealth: Payer: Self-pay | Admitting: Hematology and Oncology

## 2018-08-08 VITALS — BP 118/61 | HR 77 | Temp 97.8°F | Resp 18 | Ht 63.0 in | Wt 207.8 lb

## 2018-08-08 DIAGNOSIS — Z79899 Other long term (current) drug therapy: Secondary | ICD-10-CM | POA: Diagnosis not present

## 2018-08-08 DIAGNOSIS — Z9221 Personal history of antineoplastic chemotherapy: Secondary | ICD-10-CM | POA: Diagnosis not present

## 2018-08-08 DIAGNOSIS — Z923 Personal history of irradiation: Secondary | ICD-10-CM | POA: Insufficient documentation

## 2018-08-08 DIAGNOSIS — Z170 Estrogen receptor positive status [ER+]: Secondary | ICD-10-CM | POA: Diagnosis not present

## 2018-08-08 DIAGNOSIS — C50512 Malignant neoplasm of lower-outer quadrant of left female breast: Secondary | ICD-10-CM | POA: Diagnosis present

## 2018-08-08 DIAGNOSIS — R232 Flushing: Secondary | ICD-10-CM

## 2018-08-08 DIAGNOSIS — Z79811 Long term (current) use of aromatase inhibitors: Secondary | ICD-10-CM

## 2018-08-08 DIAGNOSIS — G620 Drug-induced polyneuropathy: Secondary | ICD-10-CM

## 2018-08-08 MED ORDER — ANASTROZOLE 1 MG PO TABS: 1.0000 mg | ORAL_TABLET | Freq: Every day | ORAL | 3 refills | Status: DC

## 2018-08-08 MED ORDER — GABAPENTIN 400 MG PO CAPS: 400.0000 mg | ORAL_CAPSULE | Freq: Three times a day (TID) | ORAL | 1 refills | Status: DC

## 2018-08-08 NOTE — Assessment & Plan Note (Signed)
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

## 2018-08-08 NOTE — Assessment & Plan Note (Signed)
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 x4 followed by Taxol x8 and status post radiation therapy. Currently on tamoxifen 20 mg daily started in November 2012 but she stopped taking it May 2015 due to severe hot flashes.   Current treatment: Anastrozole 1 mg daily started May 2017 (when she became postmenopausal), plan to continue to 2022 Anastrozole toxicities: 1. Hot flashes not as severe as tamoxifen 2.mild musculoskeletal discomforts  Breast Cancer Surveillance: 1. Breast exam  08/08/2018: No palpable lumps or nodules of concern 2. Mammogram: 01/27/2018: No mammographic evidence of malignancy left-sided posttreatment changes breast density category B  Return to clinic in 1 year for follow-up

## 2018-08-08 NOTE — Telephone Encounter (Signed)
Gave avs and calendar ° °

## 2018-08-08 NOTE — Progress Notes (Signed)
Patient Care Team: Midge Minium, MD as PCP - General Servando Salina, MD as Consulting Physician (Obstetrics and Gynecology) Nicholas Lose, MD as Consulting Physician (Hematology and Oncology)  DIAGNOSIS:  Encounter Diagnosis  Name Primary?  . Malignant neoplasm of lower-outer quadrant of left breast of female, estrogen receptor positive ([place])     SUMMARY OF ONCOLOGIC HISTORY:   Breast cancer of lower-outer quadrant of left female breast ([place])   01/20/2011 Surgery    Left breast lumpectomy: Invasive lobular carcinoma multiple foci 1.2 and 0.7 cm posterior margin and LCIS, micro met in one SLN, ER 100%, PR 100%, Ki-67 12%, HER-2 negative ratio 1.11: Oncotype DX 22, ROR14%    03/17/2011 - 07/17/2011 Chemotherapy    Adjuvant chemotherapy AC x4 followed by Taxol weekly x8    08/28/2011 - 10/13/2011 Radiation Therapy    Adjuvant radiation therapy    10/29/2011 -  Anti-estrogen oral therapy    Adjuvant tamoxifen 20 mg daily, stopped May 2016 started on anastrozole in May 2017 (and she became postmenopausal), plan of treatment 5 years     CHIEF COMPLIANT: Follow-up on anastrozole therapy  INTERVAL HISTORY: [name] is a 69-year with above-mentioned history of left breast cancer treated with lumpectomy followed by adjuvant chemo and radiation and is currently on antiestrogen therapy with anastrozole.  She plans to take it for 5 years.  She has been on it for the past 2 years and appears to be tolerating it fairly well.  She does have hot flashes which are fairly mild.  Denies any lumps or nodules in the breast.  REVIEW OF SYSTEMS:   Constitutional: Denies fevers, chills or abnormal weight loss Eyes: Denies blurriness of vision Ears, nose, mouth, throat, and face: Denies mucositis or sore throat Respiratory: Denies cough, dyspnea or wheezes Cardiovascular: Denies palpitation, chest discomfort Gastrointestinal:  Denies nausea, heartburn or change in bowel habits Skin:  Denies abnormal skin rashes Lymphatics: Denies new lymphadenopathy or easy bruising Neurological: Complains of neuropathy.  She takes gabapentin for this. Behavioral/Psych: Mood is stable, no new changes  Extremities: No lower extremity edema Breast:  denies any pain or lumps or nodules in either breasts All other systems were reviewed with the patient and are negative.  I have reviewed the past medical history, past surgical history, social history and family history with the patient and they are unchanged from previous note.  ALLERGIES:  has No Known Allergies.  MEDICATIONS:  Current Outpatient Medications  Medication Sig Dispense Refill  . anastrozole (ARIMIDEX) 1 MG tablet TAKE 1 TABLET (1 MG TOTAL) BY MOUTH DAILY. 90 tablet 3  . atenolol-chlorthalidone (TENORETIC) 50-25 MG tablet TAKE 0.5 TABLETS BY MOUTH DAILY. 45 tablet 2  . atorvastatin (LIPITOR) 10 MG tablet TAKE 1 TABLET BY MOUTH EVERY DAY 90 tablet 1  . cyclobenzaprine (FLEXERIL) 5 MG tablet TAKE 1 TABLET BY MOUTH EVERYDAY AT BEDTIME 30 tablet 0  . furosemide (LASIX) 40 MG tablet Take 1 tablet (40 mg total) by mouth daily. 90 tablet 0  . gabapentin (NEURONTIN) 400 MG capsule Take 1 capsule (400 mg total) by mouth 3 (three) times daily. 270 capsule 1  . meloxicam (MOBIC) 15 MG tablet TAKE 1 TABLET DAILY WITH FOOD AS NEEDED  2  . zolpidem (AMBIEN) 10 MG tablet TAKE 1 TABLET (10 MG TOTAL) BY MOUTH AT BEDTIME AS NEEDED. 90 tablet 0   No current facility-administered medications for this visit.     PHYSICAL EXAMINATION: ECOG PERFORMANCE STATUS: 1 - Symptomatic but  completely ambulatory  Vitals:   08/08/18 1345  BP: 118/61  Pulse: 77  Resp: 18  Temp: 97.8 F (36.6 C)  SpO2: 100%   Filed Weights   08/08/18 1345  Weight: 207 lb 12.8 oz (94.3 kg)    GENERAL:alert, no distress and comfortable SKIN: skin color, texture, turgor are normal, no rashes or significant lesions EYES: normal, Conjunctiva are pink and  non-injected, sclera clear OROPHARYNX:no exudate, no erythema and lips, buccal mucosa, and tongue normal  NECK: supple, thyroid normal size, non-tender, without nodularity LYMPH:  no palpable lymphadenopathy in the cervical, axillary or inguinal LUNGS: clear to auscultation and percussion with normal breathing effort HEART: regular rate & rhythm and no murmurs and no lower extremity edema ABDOMEN:abdomen soft, non-tender and normal bowel sounds MUSCULOSKELETAL:no cyanosis of digits and no clubbing  NEURO: alert & oriented x 3 with fluent speech, neuropathy in her hands and feet EXTREMITIES: No lower extremity edema BREAST: No palpable masses or nodules in either right or left breasts. No palpable axillary supraclavicular or infraclavicular adenopathy no breast tenderness or nipple discharge. (exam performed in the presence of a chaperone)  LABORATORY DATA:  I have reviewed the data as listed CMP Latest Ref Rng & Units 03/02/2018 09/03/2017 05/26/2016  Glucose 70 - 99 mg/dL 114(H) 110(H) 95  BUN 6 - 23 mg/dL '15 16 14  ' Creatinine 0.40 - 1.20 mg/dL 0.84 0.72 0.68  Sodium 135 - 145 mEq/L 139 139 139  Potassium 3.5 - 5.1 mEq/L 3.7 4.3 4.0  Chloride 96 - 112 mEq/L 96 99 103  CO2 19 - 32 mEq/L 36(H) 32 32  Calcium 8.4 - 10.5 mg/dL 10.3 10.2 9.7  Total Protein 6.0 - 8.3 g/dL 7.9 7.4 7.0  Total Bilirubin 0.2 - 1.2 mg/dL 0.4 0.4 0.3  Alkaline Phos 39 - 117 U/L 97 90 71  AST 0 - 37 U/L '13 14 11  ' ALT 0 - 35 U/L '10 18 9    ' Lab Results  Component Value Date   WBC 4.9 03/02/2018   HGB 13.6 03/02/2018   HCT 39.6 03/02/2018   MCV 91.5 03/02/2018   PLT 344.0 03/02/2018   NEUTROABS 2.3 03/02/2018    ASSESSMENT & PLAN:  Breast cancer of lower-outer quadrant of left female breast Left breast cancer stage II invasive lobular with one positive lymph node for micrometastatic disease status post lumpectomy followed by adjuvant chemotherapy with a.c. x4 followed by Taxol x8 and status post radiation  therapy. Currently on tamoxifen 20 mg daily started in November 2012 but she stopped taking it May 2015 due to severe hot flashes.   Current treatment: Anastrozole 1 mg daily started May 2017 (when she became postmenopausal), plan to continue to 2022 Anastrozole toxicities: 1. Hot flashes not as severe as tamoxifen 2.mild musculoskeletal discomforts  Breast Cancer Surveillance: 1. Breast exam  08/08/2018: No palpable lumps or nodules of concern 2. Mammogram: 01/27/2018: No mammographic evidence of malignancy left-sided posttreatment changes breast density category B  Return to clinic in 1 year for follow-up  No orders of the defined types were placed in this encounter.  The patient has a good understanding of the overall plan. she agrees with it. she will call with any problems that may develop before the next visit here.   Harriette Ohara, MD 08/08/18

## 2018-08-11 ENCOUNTER — Other Ambulatory Visit: Payer: Self-pay

## 2018-08-11 ENCOUNTER — Encounter

## 2018-08-11 ENCOUNTER — Ambulatory Visit: Payer: BLUE CROSS/BLUE SHIELD | Admitting: Family Medicine

## 2018-08-11 ENCOUNTER — Encounter: Payer: Self-pay | Admitting: Family Medicine

## 2018-08-11 VITALS — BP 124/78 | HR 79 | Temp 98.1°F | Resp 16 | Ht 63.0 in | Wt 207.0 lb

## 2018-08-11 DIAGNOSIS — G4700 Insomnia, unspecified: Secondary | ICD-10-CM

## 2018-08-11 DIAGNOSIS — I10 Essential (primary) hypertension: Secondary | ICD-10-CM | POA: Diagnosis not present

## 2018-08-11 DIAGNOSIS — E785 Hyperlipidemia, unspecified: Secondary | ICD-10-CM

## 2018-08-11 MED ORDER — ZOLPIDEM TARTRATE 10 MG PO TABS: 10.0000 mg | ORAL_TABLET | Freq: Every evening | ORAL | 0 refills | Status: DC | PRN

## 2018-08-11 NOTE — Assessment & Plan Note (Signed)
Chronic problem.  Refill on Ambien provided

## 2018-08-11 NOTE — Assessment & Plan Note (Signed)
Chronic problem, well controlled.  Asymptomatic.  Check labs.  No anticipated med changes.  Will follow. 

## 2018-08-11 NOTE — Progress Notes (Signed)
   Subjective:    Patient ID: [name], female    DOB: August 06, 1961, 57 y.o.   MRN: [id number]  HPI HTN- chronic problem, on Atenolol-chlorthalidone 50/25mg  daily.  No CP, SOB, HAs, visual changes  Hyperlipidemia- chronic problem, on Lipitor 10mg  daily.  Pt is down 4 lbs since last visit.  No formal exercise.  No abd pain, N/V.   Insomnia- needs refill on Ambien.   Review of Systems For ROS see HPI     Objective:   Physical Exam  Constitutional: She is oriented to person, place, and time. She appears well-developed and well-nourished. No distress.  obese  HENT:  Head: Normocephalic and atraumatic.  Eyes: Pupils are equal, round, and reactive to light. Conjunctivae and EOM are normal.  Neck: Normal range of motion. Neck supple. No thyromegaly present.  Cardiovascular: Normal rate, regular rhythm, normal heart sounds and intact distal pulses.  No murmur heard. Pulmonary/Chest: Effort normal and breath sounds normal. No respiratory distress.  Abdominal: Soft. She exhibits no distension. There is no tenderness.  Musculoskeletal: She exhibits no edema.  Lymphadenopathy:    She has no cervical adenopathy.  Neurological: She is alert and oriented to person, place, and time.  Skin: Skin is warm and dry.  Psychiatric: She has a normal mood and affect. Her behavior is normal.  Vitals reviewed.         Assessment & Plan:

## 2018-08-11 NOTE — Assessment & Plan Note (Signed)
Chronic problem.  Tolerating statin w/o difficulty.  Check labs.  Adjust meds prn  

## 2018-08-11 NOTE — Patient Instructions (Signed)
Schedule your complete physical in 6 months We'll notify you of your lab results and make any changes if needed Continue to work on healthy diet and regular exercise- you can do it!!! Call with any questions or concerns Enjoy your vacation!!!

## 2018-08-12 ENCOUNTER — Encounter: Payer: Self-pay | Admitting: General Practice

## 2018-08-12 LAB — HEPATIC FUNCTION PANEL
ALBUMIN: 4.3 g/dL (ref 3.5–5.2)
ALT: 14 U/L (ref 0–35)
AST: 16 U/L (ref 0–37)
Alkaline Phosphatase: 102 U/L (ref 39–117)
BILIRUBIN TOTAL: 0.4 mg/dL (ref 0.2–1.2)
Bilirubin, Direct: 0.1 mg/dL (ref 0.0–0.3)
Total Protein: 7.5 g/dL (ref 6.0–8.3)

## 2018-08-12 LAB — CBC WITH DIFFERENTIAL/PLATELET
BASOS PCT: 0.7 % (ref 0.0–3.0)
Basophils Absolute: 0 10*3/uL (ref 0.0–0.1)
EOS PCT: 2.5 % (ref 0.0–5.0)
Eosinophils Absolute: 0.1 10*3/uL (ref 0.0–0.7)
HEMATOCRIT: 37.6 % (ref 36.0–46.0)
HEMOGLOBIN: 12.8 g/dL (ref 12.0–15.0)
LYMPHS PCT: 43.7 % (ref 12.0–46.0)
Lymphs Abs: 2.5 10*3/uL (ref 0.7–4.0)
MCHC: 34.1 g/dL (ref 30.0–36.0)
MCV: 91.8 fl (ref 78.0–100.0)
MONO ABS: 0.3 10*3/uL (ref 0.1–1.0)
Monocytes Relative: 5.8 % (ref 3.0–12.0)
Neutro Abs: 2.8 10*3/uL (ref 1.4–7.7)
Neutrophils Relative %: 47.3 % (ref 43.0–77.0)
Platelets: 300 10*3/uL (ref 150.0–400.0)
RBC: 4.1 Mil/uL (ref 3.87–5.11)
RDW: 13 % (ref 11.5–15.5)
WBC: 5.8 10*3/uL (ref 4.0–10.5)

## 2018-08-12 LAB — LIPID PANEL
CHOL/HDL RATIO: 3
CHOLESTEROL: 169 mg/dL (ref 0–200)
HDL: 52.1 mg/dL (ref >39.00)
LDL Cholesterol: 99 mg/dL (ref 0–99)
NonHDL: 117.11
Triglycerides: 93 mg/dL (ref 0.0–149.0)
VLDL: 18.6 mg/dL (ref 0.0–40.0)

## 2018-08-12 LAB — BASIC METABOLIC PANEL
BUN: 14 mg/dL (ref 6–23)
CALCIUM: 9.9 mg/dL (ref 8.4–10.5)
CO2: 33 mEq/L — ABNORMAL HIGH (ref 19–32)
CREATININE: 0.79 mg/dL (ref 0.40–1.20)
Chloride: 104 mEq/L (ref 96–112)
GFR: 96.76 mL/min (ref >60.00)
GLUCOSE: 86 mg/dL (ref 70–99)
POTASSIUM: 3.9 meq/L (ref 3.5–5.1)
Sodium: 143 mEq/L (ref 135–145)

## 2018-08-15 ENCOUNTER — Ambulatory Visit: Payer: BLUE CROSS/BLUE SHIELD | Admitting: Hematology and Oncology

## 2018-08-18 ENCOUNTER — Ambulatory Visit: Payer: BLUE CROSS/BLUE SHIELD | Admitting: Hematology and Oncology

## 2018-08-26 ENCOUNTER — Other Ambulatory Visit: Payer: Self-pay | Admitting: Family Medicine

## 2018-09-28 ENCOUNTER — Other Ambulatory Visit: Payer: Self-pay | Admitting: Family Medicine

## 2018-10-26 ENCOUNTER — Encounter: Payer: Self-pay | Admitting: Family Medicine

## 2018-10-26 ENCOUNTER — Ambulatory Visit: Payer: BLUE CROSS/BLUE SHIELD | Admitting: Family Medicine

## 2018-10-26 ENCOUNTER — Other Ambulatory Visit: Payer: Self-pay

## 2018-10-26 VITALS — BP 110/72 | HR 68 | Temp 98.5°F | Resp 18 | Ht 63.0 in | Wt 209.2 lb

## 2018-10-26 DIAGNOSIS — M79671 Pain in right foot: Secondary | ICD-10-CM

## 2018-10-26 DIAGNOSIS — M79672 Pain in left foot: Secondary | ICD-10-CM

## 2018-10-26 MED ORDER — DICLOFENAC SODIUM 1 % TD GEL: 2.0000 g | Freq: Four times a day (QID) | TRANSDERMAL | 3 refills | Status: DC

## 2018-10-26 NOTE — Progress Notes (Signed)
   Subjective:    Patient ID: [name], female    DOB: 09-08-1961, 57 y.o.   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  Recently R heel has become painful.  Was unable to walk yesterday due to pain.  Pain is located along achilles and not under foot.  Also has pain in L foot but much less than R.  Yesterday was on Aleve w/o relief.  Pain is '50% better today'.  No change in footwear.  No change in activity level.   Review of Systems For ROS see HPI     Objective:   Physical Exam  Constitutional: She appears well-developed and well-nourished. No distress.  Cardiovascular: Intact distal pulses.  Musculoskeletal: She exhibits tenderness (TTP along either side of R achilles but no TTP over achilles or calcaneous). She exhibits no edema or deformity.  Skin: Skin is warm and dry. No rash noted. No erythema.  Psychiatric: She has a normal mood and affect. Her behavior is normal. Thought content normal.  Vitals reviewed.         Assessment & Plan:  Bilateral foot pain- R>L.  Not located over the achilles tendon but to either side.  Start topical Voltaren gel, ice.  Encouraged calling Podiatry to schedule appt as she has an established relationship w/ Dr Fritzi Mandes.  Pt expressed understanding and is in agreement w/ plan.

## 2018-10-26 NOTE — Patient Instructions (Addendum)
Follow up as needed or as scheduled START the Voltaren gel up to 4x/day as needed for pain/inflammation ICE and elevate as you are able or as needed Call and schedule with your podiatrist to assess your gait/walking pattern Call with any questions or concerns Hang in there!

## 2018-11-29 ENCOUNTER — Other Ambulatory Visit: Payer: Self-pay | Admitting: Family Medicine

## 2018-11-30 ENCOUNTER — Other Ambulatory Visit: Payer: Self-pay | Admitting: Family Medicine

## 2018-11-30 MED ORDER — ZOLPIDEM TARTRATE 10 MG PO TABS: 10.0000 mg | ORAL_TABLET | Freq: Every evening | ORAL | 0 refills | Status: DC | PRN

## 2018-11-30 NOTE — Telephone Encounter (Signed)
Last OV 10/26/18 Zolpidem last filled 11/30/18 #90 with 0

## 2018-12-16 ENCOUNTER — Other Ambulatory Visit: Payer: Self-pay | Admitting: Hematology and Oncology

## 2018-12-16 DIAGNOSIS — Z9889 Other specified postprocedural states: Secondary | ICD-10-CM

## 2019-02-01 ENCOUNTER — Ambulatory Visit
Admission: RE | Admit: 2019-02-01 | Discharge: 2019-02-01 | Disposition: A | Discharge status: Home / Self Care | Payer: BLUE CROSS/BLUE SHIELD | Source: Ambulatory Visit | Attending: Hematology and Oncology | Admitting: Hematology and Oncology

## 2019-02-01 ENCOUNTER — Other Ambulatory Visit: Payer: Self-pay | Admitting: Hematology and Oncology

## 2019-02-01 DIAGNOSIS — Z1231 Encounter for screening mammogram for malignant neoplasm of breast: Secondary | ICD-10-CM

## 2019-02-01 DIAGNOSIS — Z9889 Other specified postprocedural states: Secondary | ICD-10-CM

## 2019-02-09 ENCOUNTER — Other Ambulatory Visit: Payer: Self-pay | Admitting: Family Medicine

## 2019-02-24 ENCOUNTER — Other Ambulatory Visit: Payer: Self-pay | Admitting: Family Medicine

## 2019-02-24 ENCOUNTER — Other Ambulatory Visit: Payer: Self-pay | Admitting: Hematology and Oncology

## 2019-02-26 ENCOUNTER — Other Ambulatory Visit: Payer: Self-pay | Admitting: Family Medicine

## 2019-02-27 MED ORDER — ZOLPIDEM TARTRATE 10 MG PO TABS: 10.0000 mg | ORAL_TABLET | Freq: Every evening | ORAL | 0 refills | Status: DC | PRN

## 2019-02-27 NOTE — Telephone Encounter (Signed)
Last OV 10/26/18 Zolpidem last filled 11/30/18 #90 with 0

## 2019-04-04 ENCOUNTER — Other Ambulatory Visit: Payer: Self-pay | Admitting: Family Medicine

## 2019-05-13 ENCOUNTER — Other Ambulatory Visit: Payer: Self-pay | Admitting: Family Medicine

## 2019-05-15 ENCOUNTER — Other Ambulatory Visit: Payer: Self-pay | Admitting: Family Medicine

## 2019-05-15 NOTE — Telephone Encounter (Signed)
Last OV 10/26/18 Zolpidem last filled 02/27/19 #90 with 0 Flexeril last filled 03/10/18 #30 with 0

## 2019-05-15 NOTE — Telephone Encounter (Signed)
Last OV 10/26/18 Flexeril last filled 03/10/18 #30 with 0 Zolpidem last filled 02/27/19 #90 with 0

## 2019-05-16 MED ORDER — CYCLOBENZAPRINE HCL 5 MG PO TABS: 5.0000 mg | ORAL_TABLET | Freq: Three times a day (TID) | ORAL | 0 refills | Status: DC | PRN

## 2019-05-16 MED ORDER — ZOLPIDEM TARTRATE 10 MG PO TABS: 10.0000 mg | ORAL_TABLET | Freq: Every evening | ORAL | 0 refills | Status: DC | PRN

## 2019-05-25 ENCOUNTER — Other Ambulatory Visit: Payer: Self-pay | Admitting: Family Medicine

## 2019-05-29 ENCOUNTER — Ambulatory Visit (INDEPENDENT_AMBULATORY_CARE_PROVIDER_SITE_OTHER): Payer: BLUE CROSS/BLUE SHIELD | Admitting: Family Medicine

## 2019-05-29 ENCOUNTER — Encounter: Payer: Self-pay | Admitting: Family Medicine

## 2019-05-29 ENCOUNTER — Other Ambulatory Visit: Payer: Self-pay

## 2019-05-29 VITALS — BP 125/82 | HR 86 | Temp 97.9°F | Resp 16 | Ht 62.0 in | Wt 220.5 lb

## 2019-05-29 DIAGNOSIS — E559 Vitamin D deficiency, unspecified: Secondary | ICD-10-CM

## 2019-05-29 DIAGNOSIS — Z0000 Encounter for general adult medical examination without abnormal findings: Secondary | ICD-10-CM

## 2019-05-29 DIAGNOSIS — G620 Drug-induced polyneuropathy: Secondary | ICD-10-CM | POA: Diagnosis not present

## 2019-05-29 DIAGNOSIS — C50512 Malignant neoplasm of lower-outer quadrant of left female breast: Secondary | ICD-10-CM | POA: Diagnosis not present

## 2019-05-29 LAB — LIPID PANEL
Cholesterol: 218 mg/dL — ABNORMAL HIGH (ref 0–200)
HDL: 47.9 mg/dL (ref >39.00)
LDL Cholesterol: 155 mg/dL — ABNORMAL HIGH (ref 0–99)
NonHDL: 170.03
Total CHOL/HDL Ratio: 5
Triglycerides: 74 mg/dL (ref 0.0–149.0)
VLDL: 14.8 mg/dL (ref 0.0–40.0)

## 2019-05-29 LAB — HEPATIC FUNCTION PANEL
ALT: 13 U/L (ref 0–35)
AST: 13 U/L (ref 0–37)
Albumin: 4 g/dL (ref 3.5–5.2)
Alkaline Phosphatase: 103 U/L (ref 39–117)
Bilirubin, Direct: 0.1 mg/dL (ref 0.0–0.3)
Total Bilirubin: 0.4 mg/dL (ref 0.2–1.2)
Total Protein: 7.1 g/dL (ref 6.0–8.3)

## 2019-05-29 LAB — BASIC METABOLIC PANEL
BUN: 21 mg/dL (ref 6–23)
CO2: 31 mEq/L (ref 19–32)
Calcium: 9.5 mg/dL (ref 8.4–10.5)
Chloride: 99 mEq/L (ref 96–112)
Creatinine, Ser: 0.85 mg/dL (ref 0.40–1.20)
GFR: 83.43 mL/min (ref >60.00)
Glucose, Bld: 102 mg/dL — ABNORMAL HIGH (ref 70–99)
Potassium: 4 mEq/L (ref 3.5–5.1)
Sodium: 137 mEq/L (ref 135–145)

## 2019-05-29 LAB — CBC WITH DIFFERENTIAL/PLATELET
Basophils Absolute: 0.1 10*3/uL (ref 0.0–0.1)
Basophils Relative: 1.9 % (ref 0.0–3.0)
Eosinophils Absolute: 0.1 10*3/uL (ref 0.0–0.7)
Eosinophils Relative: 2.6 % (ref 0.0–5.0)
HCT: 37.8 % (ref 36.0–46.0)
Hemoglobin: 13.3 g/dL (ref 12.0–15.0)
Lymphocytes Relative: 35 % (ref 12.0–46.0)
Lymphs Abs: 1.5 10*3/uL (ref 0.7–4.0)
MCHC: 35.2 g/dL (ref 30.0–36.0)
MCV: 93 fl (ref 78.0–100.0)
Monocytes Absolute: 0.3 10*3/uL (ref 0.1–1.0)
Monocytes Relative: 7.4 % (ref 3.0–12.0)
Neutro Abs: 2.2 10*3/uL (ref 1.4–7.7)
Neutrophils Relative %: 53.1 % (ref 43.0–77.0)
Platelets: 297 10*3/uL (ref 150.0–400.0)
RBC: 4.07 Mil/uL (ref 3.87–5.11)
RDW: 13.6 % (ref 11.5–15.5)
WBC: 4.2 10*3/uL (ref 4.0–10.5)

## 2019-05-29 LAB — TSH: TSH: 1.97 u[IU]/mL (ref 0.35–4.50)

## 2019-05-29 LAB — VITAMIN D 25 HYDROXY (VIT D DEFICIENCY, FRACTURES): VITD: 28.85 ng/mL — ABNORMAL LOW (ref 30.00–100.00)

## 2019-05-29 NOTE — Patient Instructions (Signed)
Follow up in 6 months to recheck BP and cholesterol We'll notify you of your lab results and make any changes if needed Continue to work on healthy diet and regular exercise- you can do it! Call with any questions or concerns Stay Safe!!!

## 2019-05-29 NOTE — Assessment & Plan Note (Signed)
Deteriorated.  Pt has gained another 11 lbs.  Stressed need for healthy diet and regular exercise- pt admits to doing neither.  Check labs to risk stratify.  Will follow.

## 2019-05-29 NOTE — Assessment & Plan Note (Signed)
Check labs and replete prn. 

## 2019-05-29 NOTE — Assessment & Plan Note (Signed)
Chronic problem.  Remains on Arimidex.  Following w/ oncology.

## 2019-05-29 NOTE — Progress Notes (Signed)
   Subjective:    Patient ID: [name], female    DOB: January 20, 1961, 58 y.o.   MRN: [id number]  HPI CPE- UTD on mammo, colonoscopy.  Pt has gained 11 lbs since last visit.  BMI is now 40.33.   Review of Systems Patient reports no vision/ hearing changes, adenopathy,fever, persistant/recurrent hoarseness , swallowing issues, chest pain, palpitations, edema, persistant/recurrent cough, hemoptysis, dyspnea (rest/exertional/paroxysmal nocturnal), gastrointestinal bleeding (melena, rectal bleeding), abdominal pain, significant heartburn, bowel changes, GU symptoms (dysuria, hematuria, incontinence), Gyn symptoms (abnormal  bleeding, pain),  syncope, focal weakness, memory loss, numbness & tingling, skin/hair/nail changes, abnormal bruising or bleeding, anxiety, or depression.     Objective:   Physical Exam General Appearance:    Alert, cooperative, no distress, appears stated age  Head:    Normocephalic, without obvious abnormality, atraumatic  Eyes:    PERRL, conjunctiva/corneas clear, EOM's intact, fundi    benign, both eyes  Ears:    Normal TM's and external ear canals, both ears  Nose:   Nares normal, septum midline, mucosa normal, no drainage    or sinus tenderness  Throat:   Lips, mucosa, and tongue normal; teeth and gums normal  Neck:   Supple, symmetrical, trachea midline, no adenopathy;    Thyroid: no enlargement/tenderness/nodules  Back:     Symmetric, no curvature, ROM normal, no CVA tenderness  Lungs:     Clear to auscultation bilaterally, respirations unlabored  Chest Wall:    No tenderness or deformity   Heart:    Regular rate and rhythm, S1 and S2 normal, no murmur, rub   or gallop  Breast Exam:    Deferred to mammo  Abdomen:     Soft, non-tender, bowel sounds active all four quadrants,    no masses, no organomegaly  Genitalia:    Deferred  Rectal:    Extremities:   Extremities normal, atraumatic, no cyanosis or edema  Pulses:   2+ and symmetric all extremities  Skin:    Skin color, texture, turgor normal, no rashes or lesions  Lymph nodes:   Cervical, supraclavicular, and axillary nodes normal  Neurologic:   CNII-XII intact, normal strength, sensation and reflexes    throughout          Assessment & Plan:

## 2019-05-29 NOTE — Assessment & Plan Note (Signed)
Stable. On gabapentin.  

## 2019-05-29 NOTE — Assessment & Plan Note (Signed)
Pt's PE WNL w/ exception of obesity.  UTD on mammo and colonoscopy.  Check labs.  Anticipatory guidance provided.

## 2019-06-02 ENCOUNTER — Encounter: Payer: BLUE CROSS/BLUE SHIELD | Admitting: Family Medicine

## 2019-06-13 ENCOUNTER — Other Ambulatory Visit: Payer: Self-pay | Admitting: Family Medicine

## 2019-06-14 MED ORDER — CYCLOBENZAPRINE HCL 5 MG PO TABS: 5.0000 mg | ORAL_TABLET | Freq: Three times a day (TID) | ORAL | 0 refills | Status: DC | PRN

## 2019-07-13 ENCOUNTER — Telehealth: Payer: Self-pay | Admitting: Hematology and Oncology

## 2019-07-13 NOTE — Telephone Encounter (Signed)
[place] 8/12 moved f/u to AM. Left message on home phone, however reach patient on cell to confirm change. Per patient cannot come 8/12. F/u moved to 8/13. Confirmed with patient.

## 2019-07-19 ENCOUNTER — Telehealth: Payer: Self-pay | Admitting: Hematology and Oncology

## 2019-07-19 NOTE — Telephone Encounter (Signed)
Scheduled apt per 7/22 sch messag e- pt aware of appt date and time

## 2019-08-09 ENCOUNTER — Ambulatory Visit: Payer: BLUE CROSS/BLUE SHIELD | Admitting: Hematology and Oncology

## 2019-08-10 ENCOUNTER — Ambulatory Visit: Payer: BLUE CROSS/BLUE SHIELD | Admitting: Hematology and Oncology

## 2019-08-10 ENCOUNTER — Telehealth: Payer: Self-pay | Admitting: Hematology and Oncology

## 2019-08-10 NOTE — Assessment & Plan Note (Signed)
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 x4 followed by Taxol x8 and status post radiation therapy. Currently on tamoxifen 20 mg daily started in November 2012 but she stopped taking it May 2015 due to severe hot flashes.   Current treatment: Anastrozole 1 mg daily started May 2017 (when she became postmenopausal), plan to continue to 2022 Anastrozole toxicities: 1. Hot flashes not as severe as tamoxifen 2.mild musculoskeletal discomforts  Breast Cancer Surveillance: 1. Breast exam 08/08/2018:No palpable lumps or nodules of concern 2. Mammogram:  To 05/17/2019: No mammographic evidence of malignancy left-sided posttreatment changes breast density category B  Return to clinic in 1 year for follow-up

## 2019-08-10 NOTE — Telephone Encounter (Signed)
I left a message regarding video visit  °

## 2019-08-14 ENCOUNTER — Telehealth: Payer: Self-pay | Admitting: Hematology and Oncology

## 2019-08-14 ENCOUNTER — Other Ambulatory Visit: Payer: Self-pay | Admitting: Family Medicine

## 2019-08-14 NOTE — Progress Notes (Signed)
HEMATOLOGY-ONCOLOGY [facility] VIDEO VISIT PROGRESS NOTE  I connected with [name] on 08/15/2019 at 11:45 AM EDT by MyChart video conference and verified that I am speaking with the correct person using two identifiers.  I discussed the limitations, risks, security and privacy concerns of performing an evaluation and management service by MyChart and the availability of in person appointments.  I also discussed with the patient that there may be a patient responsible charge related to this service. The patient expressed understanding and agreed to proceed.  Patient's Location: Home Physician Location: Clinic  CHIEF COMPLIANT: Follow-up on anastrozole therapy  INTERVAL HISTORY: [name] is a 58 y.o. female with above-mentioned history of left breast cancer treated with lumpectomy, adjuvant chemotherapy, radiation, and who is currently on antiestrogen therapy with anastrozole. I last saw her a year ago. Mammogram on 02/01/19 showed no evidence of malignancy bilaterally. She presents over MyChart today for annual follow-up.   Oncology History  Breast cancer of lower-outer quadrant of left female breast ([place])  01/20/2011 Surgery   Left breast lumpectomy: Invasive lobular carcinoma multiple foci 1.2 and 0.7 cm posterior margin and LCIS, micro met in one SLN, ER 100%, PR 100%, Ki-67 12%, HER-2 negative ratio 1.11: Oncotype DX 22, ROR14%   03/17/2011 - 07/17/2011 Chemotherapy   Adjuvant chemotherapy AC x4 followed by Taxol weekly x8   08/28/2011 - 10/13/2011 Radiation Therapy   Adjuvant radiation therapy   10/29/2011 -  Anti-estrogen oral therapy   Adjuvant tamoxifen 20 mg daily, stopped May 2016 started on anastrozole in May 2017 (and she became postmenopausal), plan of treatment 5 years     REVIEW OF SYSTEMS:   Constitutional: Denies fevers, chills or abnormal weight loss Eyes: Denies blurriness of vision Ears, nose, mouth, throat, and face: Denies mucositis or sore throat Respiratory:  Denies cough, dyspnea or wheezes Cardiovascular: Denies palpitation, chest discomfort Gastrointestinal:  Denies nausea, heartburn or change in bowel habits Skin: Denies abnormal skin rashes Lymphatics: Denies new lymphadenopathy or easy bruising Neurological:Denies numbness, tingling or new weaknesses Behavioral/Psych: Mood is stable, no new changes  Extremities: No lower extremity edema Breast: denies any pain or lumps or nodules in either breasts All other systems were reviewed with the patient and are negative.  Observations/Objective:  There were no vitals filed for this visit. There is no height or weight on file to calculate BMI.  I have reviewed the data as listed CMP Latest Ref Rng & Units 05/29/2019 08/11/2018 03/02/2018  Glucose 70 - 99 mg/dL 102(H) 86 114(H)  BUN 6 - 23 mg/dL '21 14 15  ' Creatinine 0.40 - 1.20 mg/dL 0.85 0.79 0.84  Sodium 135 - 145 mEq/L 137 143 139  Potassium 3.5 - 5.1 mEq/L 4.0 3.9 3.7  Chloride 96 - 112 mEq/L 99 104 96  CO2 19 - 32 mEq/L 31 33(H) 36(H)  Calcium 8.4 - 10.5 mg/dL 9.5 9.9 10.3  Total Protein 6.0 - 8.3 g/dL 7.1 7.5 7.9  Total Bilirubin 0.2 - 1.2 mg/dL 0.4 0.4 0.4  Alkaline Phos 39 - 117 U/L 103 102 97  AST 0 - 37 U/L '13 16 13  ' ALT 0 - 35 U/L '13 14 10    ' Lab Results  Component Value Date   WBC 4.2 05/29/2019   HGB 13.3 05/29/2019   HCT 37.8 05/29/2019   MCV 93.0 05/29/2019   PLT 297.0 05/29/2019   NEUTROABS 2.2 05/29/2019      Assessment Plan:  Breast cancer of lower-outer quadrant of left female breast  Left breast cancer stage II invasive lobular with one positive lymph node for micrometastatic disease status post lumpectomy followed by adjuvant chemotherapy with a.c. x4 followed by Taxol x8 and status post radiation therapy. Currently on tamoxifen 20 mg daily started in November 2012 but she stopped taking it May 2015 due to severe hot flashes.   Current treatment: Anastrozole 1 mg daily started May 2017 (when she became  postmenopausal), plan to continue to 2022 Anastrozole toxicities: 1. Hot flashes not as severe as tamoxifen 2.mild musculoskeletal discomforts  Breast Cancer Surveillance: 1. Breast exam 08/08/2018:No palpable lumps or nodules of concern 2. Mammogram:  To 05/17/2019: No mammographic evidence of malignancy left-sided posttreatment changes breast density category B  Return to clinic in 1 year for follow-up  I discussed the assessment and treatment plan with the patient. The patient was provided an opportunity to ask questions and all were answered. The patient agreed with the plan and demonstrated an understanding of the instructions. The patient was advised to call back or seek an in-person evaluation if the symptoms worsen or if the condition fails to improve as anticipated.   I provided 15 minutes of face-to-face MyChart video visit time during this encounter.    Rulon Eisenmenger, MD 08/15/2019   I, Molly Dorshimer, am acting as scribe for Nicholas Lose, MD.  I have reviewed the above documentation for accuracy and completeness, and I agree with the above.

## 2019-08-15 ENCOUNTER — Inpatient Hospital Stay: Payer: BC Managed Care – PPO | Attending: Hematology and Oncology | Admitting: Hematology and Oncology

## 2019-08-15 ENCOUNTER — Other Ambulatory Visit: Payer: Self-pay

## 2019-08-15 DIAGNOSIS — Z79811 Long term (current) use of aromatase inhibitors: Secondary | ICD-10-CM | POA: Diagnosis not present

## 2019-08-15 DIAGNOSIS — C50512 Malignant neoplasm of lower-outer quadrant of left female breast: Secondary | ICD-10-CM | POA: Diagnosis present

## 2019-08-15 DIAGNOSIS — Z170 Estrogen receptor positive status [ER+]: Secondary | ICD-10-CM

## 2019-08-15 MED ORDER — ANASTROZOLE 1 MG PO TABS: 1.0000 mg | ORAL_TABLET | Freq: Every day | ORAL | 3 refills | Status: DC

## 2019-08-15 NOTE — Telephone Encounter (Signed)
Last OV 05/29/19 Flexeril last filled 06/14/19 #30 with 0 Zolpidem last filled 05/16/19 #90 with 0

## 2019-08-16 MED ORDER — CYCLOBENZAPRINE HCL 5 MG PO TABS: 5.0000 mg | ORAL_TABLET | Freq: Three times a day (TID) | ORAL | 0 refills | Status: DC | PRN

## 2019-08-16 MED ORDER — ZOLPIDEM TARTRATE 10 MG PO TABS: 10.0000 mg | ORAL_TABLET | Freq: Every evening | ORAL | 0 refills | Status: DC | PRN

## 2019-08-26 ENCOUNTER — Other Ambulatory Visit: Payer: Self-pay | Admitting: Hematology and Oncology

## 2019-09-21 ENCOUNTER — Other Ambulatory Visit: Payer: Self-pay | Admitting: Physician Assistant

## 2019-09-22 NOTE — Telephone Encounter (Signed)
Ambien 08/16/19 #30 LOV: 05/29/19 CPE

## 2019-10-12 ENCOUNTER — Other Ambulatory Visit: Payer: Self-pay

## 2019-10-12 ENCOUNTER — Ambulatory Visit (INDEPENDENT_AMBULATORY_CARE_PROVIDER_SITE_OTHER): Payer: BC Managed Care – PPO | Admitting: Family Medicine

## 2019-10-12 ENCOUNTER — Encounter: Payer: Self-pay | Admitting: Family Medicine

## 2019-10-12 DIAGNOSIS — L720 Epidermal cyst: Secondary | ICD-10-CM

## 2019-10-12 NOTE — Progress Notes (Signed)
I have discussed the procedure for the virtual visit with the patient who has given consent to proceed with assessment and treatment.   Pt unable to obtain vitals.   Tacuma Graffam L Nailah Luepke, CMA     

## 2019-10-12 NOTE — Progress Notes (Signed)
Virtual Visit via Video   I connected with patient on 10/12/19 at  4:15 PM EDT by a video enabled telemedicine application and verified that I am speaking with the correct person using two identifiers.  Location patient: Home Location provider: Acupuncturist, Office Persons participating in the virtual visit: Patient, Provider, [place] (Jess B)  I discussed the limitations of evaluation and management by telemedicine and the availability of in person appointments. The patient expressed understanding and agreed to proceed.  Subjective:   HPI:   Facial bumps- has white closed comedone between eyes and one on L nasolabial fold.  Have been present 'at least 3-4 yrs'.  Has not changed in size or shape.  Doesn't hurt, itch, ooze, or drain.  Will periodically stick a pin in it but no drainage other than bleeding.  ROS:   See pertinent positives and negatives per HPI.  Patient Active Problem List   Diagnosis Date Noted  . Hot flashes due to tamoxifen 05/25/2014  . Knee pain, bilateral 02/09/2014  . General medical examination 05/02/2012  . 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 OBSTRUCTIVE SLEEP APNEA 02/09/2011  . Breast cancer of lower-outer quadrant of left female breast ([place]) 01/14/2011  . Vitamin D deficiency 12/30/2010  . Hyperlipidemia 08/13/2009  . Anxiety and depression 08/13/2009  . HIP PAIN, RIGHT 06/29/2009  . KNEE PAIN, RIGHT 06/29/2009  . INSOMNIA 07/24/2008  . METABOLIC SYNDROME X 123XX123  . Morbid obesity ([place]) 07/22/2007  . Essential hypertension 07/22/2007    Social History   Tobacco Use  . Smoking status: Former Smoker    Quit date: 12/28/1989    Years since quitting: 29.8  . Smokeless tobacco: Never Used  Substance Use Topics  . Alcohol use: Yes    Comment: 1 glass of wine once a month    Current Outpatient Medications:  .  anastrozole (ARIMIDEX) 1 MG tablet, Take 1 tablet (1 mg total) by mouth daily., Disp: 90 tablet, Rfl: 3 .   atenolol-chlorthalidone (TENORETIC) 50-25 MG tablet, TAKE 0.5 TABLETS BY MOUTH DAILY., Disp: 45 tablet, Rfl: 2 .  atorvastatin (LIPITOR) 10 MG tablet, TAKE 1 TABLET BY MOUTH EVERY DAY, Disp: 90 tablet, Rfl: 1 .  cyclobenzaprine (FLEXERIL) 5 MG tablet, Take 1 tablet (5 mg total) by mouth 3 (three) times daily as needed for muscle spasms., Disp: 30 tablet, Rfl: 0 .  diclofenac sodium (VOLTAREN) 1 % GEL, APPLY 2 G TOPICALLY 4 (FOUR) TIMES DAILY., Disp: 100 g, Rfl: 3 .  furosemide (LASIX) 40 MG tablet, Take 1 tablet (40 mg total) by mouth daily., Disp: 90 tablet, Rfl: 0 .  gabapentin (NEURONTIN) 400 MG capsule, TAKE 1 CAPSULE BY MOUTH 3 TIMES A DAY, Disp: 270 capsule, Rfl: 1 .  meloxicam (MOBIC) 15 MG tablet, TAKE 1 TABLET DAILY WITH FOOD AS NEEDED, Disp: , Rfl: 2 .  zolpidem (AMBIEN) 10 MG tablet, TAKE 1 TABLET (10 MG TOTAL) BY MOUTH AT BEDTIME AS NEEDED., Disp: 30 tablet, Rfl: 3  No Known Allergies  Objective:   There were no vitals taken for this visit.  AAOx3, NAD NCAT, EOMI Small white cystic appearing mass between eyes and similar mass on L nasolabial fold No obvious CN deficits Coloring WNL Pt is able to speak clearly, coherently without shortness of breath or increased work of breathing.  Thought process is linear.  Mood is appropriate.   Assessment and Plan:   Inclusion cyst- new to provider, ongoing for pt.  On limited video PE this  appears to be consistent w/ inclusion cyst.  Will refer to derm for definitive tx.  Pt expressed understanding and is in agreement w/ plan.    Annye Asa, MD 10/12/2019

## 2019-10-16 ENCOUNTER — Other Ambulatory Visit: Payer: Self-pay | Admitting: Family Medicine

## 2019-11-24 ENCOUNTER — Other Ambulatory Visit: Payer: Self-pay | Admitting: Family Medicine

## 2019-12-07 ENCOUNTER — Other Ambulatory Visit: Payer: Self-pay | Admitting: Hematology and Oncology

## 2019-12-14 ENCOUNTER — Other Ambulatory Visit: Payer: Self-pay | Admitting: Cardiology

## 2019-12-14 DIAGNOSIS — Z20822 Contact with and (suspected) exposure to covid-19: Secondary | ICD-10-CM

## 2019-12-15 ENCOUNTER — Other Ambulatory Visit: Payer: BC Managed Care – PPO

## 2019-12-15 LAB — NOVEL CORONAVIRUS, NAA: SARS-CoV-2, NAA: NOT DETECTED

## 2020-01-02 ENCOUNTER — Other Ambulatory Visit: Payer: Self-pay

## 2020-01-02 ENCOUNTER — Ambulatory Visit (INDEPENDENT_AMBULATORY_CARE_PROVIDER_SITE_OTHER): Payer: BC Managed Care – PPO | Admitting: Family Medicine

## 2020-01-02 ENCOUNTER — Encounter: Payer: Self-pay | Admitting: Family Medicine

## 2020-01-02 ENCOUNTER — Other Ambulatory Visit: Payer: Self-pay | Admitting: General Practice

## 2020-01-02 DIAGNOSIS — G4700 Insomnia, unspecified: Secondary | ICD-10-CM

## 2020-01-02 DIAGNOSIS — B369 Superficial mycosis, unspecified: Secondary | ICD-10-CM

## 2020-01-02 MED ORDER — NYSTATIN 100000 UNIT/GM EX POWD: 1.0000 "application " | Freq: Three times a day (TID) | CUTANEOUS | 0 refills | Status: DC

## 2020-01-02 MED ORDER — ZOLPIDEM TARTRATE 10 MG PO TABS: 10.0000 mg | ORAL_TABLET | Freq: Every evening | ORAL | 3 refills | Status: DC | PRN

## 2020-01-02 MED ORDER — DICLOFENAC SODIUM 1 % EX GEL: 2.0000 g | Freq: Four times a day (QID) | CUTANEOUS | 1 refills | Status: DC

## 2020-01-02 MED ORDER — NYSTATIN 100000 UNIT/GM EX OINT: 1.0000 "application " | TOPICAL_OINTMENT | Freq: Two times a day (BID) | CUTANEOUS | 0 refills | Status: DC

## 2020-01-02 NOTE — Progress Notes (Signed)
I have discussed the procedure for the virtual visit with the patient who has given consent to proceed with assessment and treatment.   Pt unable to obtain vitals.   Falen Lehrmann L Carma Dwiggins, CMA     

## 2020-01-02 NOTE — Progress Notes (Signed)
Virtual Visit via Video   I connected with patient on 01/02/20 at  8:30 AM EST by a video enabled telemedicine application and verified that I am speaking with the correct person using two identifiers.  Location patient: Home Location provider: Acupuncturist, Office Persons participating in the virtual visit: Patient, Provider, [place] (Jess B)  I discussed the limitations of evaluation and management by telemedicine and the availability of in person appointments. The patient expressed understanding and agreed to proceed.  Subjective:   HPI:   Groin irritation- pt reports she is not prone to yeast infections but has recently developed irritation and itching.  Did buy new underwear and did not pre-wash them.  Irritation is in groin creases bilaterally.  sxs started 3-4 weeks ago.  Has been using diaper ointment w/ some relief.  Denies vaginal d/c.  ROS:   See pertinent positives and negatives per HPI.  Patient Active Problem List   Diagnosis Date Noted  . Hot flashes due to tamoxifen 05/25/2014  . Knee pain, bilateral 02/09/2014  . General medical examination 05/02/2012  . 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 OBSTRUCTIVE SLEEP APNEA 02/09/2011  . Breast cancer of lower-outer quadrant of left female breast ([place]) 01/14/2011  . Vitamin D deficiency 12/30/2010  . Hyperlipidemia 08/13/2009  . Anxiety and depression 08/13/2009  . HIP PAIN, RIGHT 06/29/2009  . KNEE PAIN, RIGHT 06/29/2009  . INSOMNIA 07/24/2008  . METABOLIC SYNDROME X 123XX123  . Morbid obesity ([place]) 07/22/2007  . Essential hypertension 07/22/2007    Social History   Tobacco Use  . Smoking status: Former Smoker    Quit date: 12/28/1989    Years since quitting: 30.0  . Smokeless tobacco: Never Used  Substance Use Topics  . Alcohol use: Yes    Comment: 1 glass of wine once a month    Current Outpatient Medications:  .  anastrozole (ARIMIDEX) 1 MG tablet, Take 1 tablet (1 mg total) by mouth daily.,  Disp: 90 tablet, Rfl: 3 .  atenolol-chlorthalidone (TENORETIC) 50-25 MG tablet, TAKE 0.5 TABLETS BY MOUTH DAILY., Disp: 45 tablet, Rfl: 2 .  atorvastatin (LIPITOR) 10 MG tablet, TAKE 1 TABLET BY MOUTH EVERY DAY, Disp: 90 tablet, Rfl: 1 .  cyclobenzaprine (FLEXERIL) 5 MG tablet, Take 1 tablet (5 mg total) by mouth 3 (three) times daily as needed for muscle spasms., Disp: 30 tablet, Rfl: 0 .  diclofenac sodium (VOLTAREN) 1 % GEL, APPLY 2 G TOPICALLY 4 (FOUR) TIMES DAILY., Disp: 100 g, Rfl: 3 .  furosemide (LASIX) 40 MG tablet, Take 1 tablet (40 mg total) by mouth daily., Disp: 90 tablet, Rfl: 0 .  gabapentin (NEURONTIN) 400 MG capsule, TAKE 1 CAPSULE BY MOUTH 3 TIMES A DAY, Disp: 270 capsule, Rfl: 1 .  meloxicam (MOBIC) 15 MG tablet, TAKE 1 TABLET DAILY WITH FOOD AS NEEDED, Disp: , Rfl: 2 .  zolpidem (AMBIEN) 10 MG tablet, TAKE 1 TABLET (10 MG TOTAL) BY MOUTH AT BEDTIME AS NEEDED., Disp: 30 tablet, Rfl: 3  No Known Allergies  Objective:   There were no vitals taken for this visit. AAOx3, NAD NCAT, EOMI No obvious CN deficits Coloring WNL Pt is able to speak clearly, coherently without shortness of breath or increased work of breathing.  Thought process is linear.  Mood is appropriate.   Assessment and Plan:   Fungal dermatitis- new.  Pt reports there are satellite lesions in groin bilaterally.  Will start w/ ointment due to itching and burning and then switch  to anti-fungal powder PRN.  Reviewed supportive care and red flags that should prompt return.  Pt expressed understanding and is in agreement w/ plan.   Insomnia- refill on Ambien provided   Annye Asa, MD 01/02/2020

## 2020-01-11 ENCOUNTER — Other Ambulatory Visit: Payer: Self-pay | Admitting: Hematology and Oncology

## 2020-01-11 DIAGNOSIS — Z1231 Encounter for screening mammogram for malignant neoplasm of breast: Secondary | ICD-10-CM

## 2020-02-15 ENCOUNTER — Ambulatory Visit
Admission: RE | Admit: 2020-02-15 | Discharge: 2020-02-15 | Disposition: A | Discharge status: Home / Self Care | Payer: BC Managed Care – PPO | Source: Ambulatory Visit | Attending: Hematology and Oncology | Admitting: Hematology and Oncology

## 2020-02-15 ENCOUNTER — Other Ambulatory Visit: Payer: Self-pay

## 2020-02-15 DIAGNOSIS — Z1231 Encounter for screening mammogram for malignant neoplasm of breast: Secondary | ICD-10-CM

## 2020-02-16 ENCOUNTER — Ambulatory Visit: Payer: BC Managed Care – PPO

## 2020-02-22 ENCOUNTER — Other Ambulatory Visit: Payer: Self-pay | Admitting: Physician Assistant

## 2020-02-22 ENCOUNTER — Other Ambulatory Visit: Payer: Self-pay | Admitting: Family Medicine

## 2020-02-22 MED ORDER — NYSTATIN 100000 UNIT/GM EX OINT: 1.0000 "application " | TOPICAL_OINTMENT | Freq: Two times a day (BID) | CUTANEOUS | 0 refills | Status: DC

## 2020-02-22 MED ORDER — NYSTATIN 100000 UNIT/GM EX POWD: 1.0000 "application " | Freq: Three times a day (TID) | CUTANEOUS | 0 refills | Status: DC

## 2020-02-22 NOTE — Telephone Encounter (Signed)
Pt requested both this and the powder.

## 2020-02-22 NOTE — Telephone Encounter (Signed)
Ok to refill 

## 2020-02-22 NOTE — Telephone Encounter (Signed)
OK for refill?  As needed for muscle spasms  Filled in August 2020

## 2020-02-24 ENCOUNTER — Other Ambulatory Visit: Payer: Self-pay

## 2020-02-24 ENCOUNTER — Ambulatory Visit: Payer: BC Managed Care – PPO | Attending: Internal Medicine

## 2020-02-24 DIAGNOSIS — Z23 Encounter for immunization: Secondary | ICD-10-CM | POA: Insufficient documentation

## 2020-02-24 NOTE — Progress Notes (Signed)
   Covid-19 Vaccination Clinic  Name:  [name]    MRN: [id number] DOB: [birth year]  02/24/2020  [name] was observed post Covid-19 immunization for 15 minutes without incidence. She was provided with Vaccine Information Sheet and instruction to access the V-Safe system.   [name] was instructed to call 911 with any severe reactions post vaccine: Marland Kitchen Difficulty breathing  . Swelling of your face and throat  . A fast heartbeat  . A bad rash all over your body  . Dizziness and weakness    Immunizations Administered    Name Date Dose VIS Date Route   Pfizer COVID-19 Vaccine 02/24/2020  9:14 AM 0.3 mL 12/08/2019 Intramuscular   Manufacturer: [place]   Lot: [id number]   [place]: [id number]

## 2020-03-13 ENCOUNTER — Other Ambulatory Visit: Payer: Self-pay | Admitting: Family Medicine

## 2020-03-18 ENCOUNTER — Ambulatory Visit: Payer: BC Managed Care – PPO | Attending: Internal Medicine

## 2020-03-18 DIAGNOSIS — Z23 Encounter for immunization: Secondary | ICD-10-CM

## 2020-03-18 NOTE — Progress Notes (Signed)
   Covid-19 Vaccination Clinic  Name:  [name]    MRN: [id number] DOB: 06/20/1961  03/18/2020  [name] was observed post Covid-19 immunization for 15 minutes without incident. She was provided with Vaccine Information Sheet and instruction to access the V-Safe system.   [name] was instructed to call 911 with any severe reactions post vaccine: Marland Kitchen Difficulty breathing  . Swelling of face and throat  . A fast heartbeat  . A bad rash all over body  . Dizziness and weakness   Immunizations Administered    Name Date Dose VIS Date Route   Pfizer COVID-19 Vaccine 03/18/2020  8:07 AM 0.3 mL 12/08/2019 Intramuscular   Manufacturer: [place]   Lot: [id number]   [place]: [id number]

## 2020-05-09 ENCOUNTER — Other Ambulatory Visit: Payer: Self-pay | Admitting: Family Medicine

## 2020-05-09 NOTE — Telephone Encounter (Signed)
Zolpidem last filled 01/02/20 #30 with 3 refills Last office visit 01/02/20 Next office visit 05/30/20

## 2020-05-28 ENCOUNTER — Ambulatory Visit: Payer: Self-pay | Admitting: Psychology

## 2020-05-30 ENCOUNTER — Ambulatory Visit (INDEPENDENT_AMBULATORY_CARE_PROVIDER_SITE_OTHER): Payer: BC Managed Care – PPO | Admitting: Family Medicine

## 2020-05-30 ENCOUNTER — Encounter: Payer: Self-pay | Admitting: Family Medicine

## 2020-05-30 ENCOUNTER — Ambulatory Visit: Payer: Self-pay | Admitting: Psychology

## 2020-05-30 ENCOUNTER — Other Ambulatory Visit: Payer: Self-pay

## 2020-05-30 VITALS — BP 130/76 | HR 78 | Temp 98.0°F | Resp 16 | Ht 62.0 in | Wt 220.5 lb

## 2020-05-30 DIAGNOSIS — F419 Anxiety disorder, unspecified: Secondary | ICD-10-CM | POA: Diagnosis not present

## 2020-05-30 DIAGNOSIS — G620 Drug-induced polyneuropathy: Secondary | ICD-10-CM | POA: Diagnosis not present

## 2020-05-30 DIAGNOSIS — C50512 Malignant neoplasm of lower-outer quadrant of left female breast: Secondary | ICD-10-CM | POA: Diagnosis not present

## 2020-05-30 DIAGNOSIS — E559 Vitamin D deficiency, unspecified: Secondary | ICD-10-CM

## 2020-05-30 DIAGNOSIS — Z0000 Encounter for general adult medical examination without abnormal findings: Secondary | ICD-10-CM

## 2020-05-30 DIAGNOSIS — F329 Major depressive disorder, single episode, unspecified: Secondary | ICD-10-CM | POA: Diagnosis not present

## 2020-05-30 DIAGNOSIS — F32A Depression, unspecified: Secondary | ICD-10-CM

## 2020-05-30 MED ORDER — FLUOXETINE HCL 10 MG PO CAPS
10.0000 mg | ORAL_CAPSULE | Freq: Every day | ORAL | 3 refills | Status: DC
Start: 2020-05-30 — End: 2020-07-19

## 2020-05-30 NOTE — Patient Instructions (Addendum)
Follow up in 1 month to recheck mood We'll notify you of your lab results and make any changes if needed Start the Fluoxetine daily to improve mood and anxiety Call with any questions or concerns Hang in there!  We'll get you feeling better!

## 2020-05-31 LAB — CBC WITH DIFFERENTIAL/PLATELET
Basophils Absolute: 0.1 10*3/uL (ref 0.0–0.1)
Basophils Relative: 1.5 % (ref 0.0–3.0)
Eosinophils Absolute: 0.2 10*3/uL (ref 0.0–0.7)
Eosinophils Relative: 3 % (ref 0.0–5.0)
HCT: 38.7 % (ref 36.0–46.0)
Hemoglobin: 13.1 g/dL (ref 12.0–15.0)
Lymphocytes Relative: 40.4 % (ref 12.0–46.0)
Lymphs Abs: 2 10*3/uL (ref 0.7–4.0)
MCHC: 33.8 g/dL (ref 30.0–36.0)
MCV: 92.3 fl (ref 78.0–100.0)
Monocytes Absolute: 0.4 10*3/uL (ref 0.1–1.0)
Monocytes Relative: 8 % (ref 3.0–12.0)
Neutro Abs: 2.4 10*3/uL (ref 1.4–7.7)
Neutrophils Relative %: 47.1 % (ref 43.0–77.0)
Platelets: 294 10*3/uL (ref 150.0–400.0)
RBC: 4.19 Mil/uL (ref 3.87–5.11)
RDW: 13 % (ref 11.5–15.5)
WBC: 5 10*3/uL (ref 4.0–10.5)

## 2020-05-31 LAB — TSH: TSH: 0.8 u[IU]/mL (ref 0.35–4.50)

## 2020-05-31 LAB — HEPATIC FUNCTION PANEL
ALT: 12 U/L (ref 0–35)
AST: 14 U/L (ref 0–37)
Albumin: 4.4 g/dL (ref 3.5–5.2)
Alkaline Phosphatase: 111 U/L (ref 39–117)
Bilirubin, Direct: 0.1 mg/dL (ref 0.0–0.3)
Total Bilirubin: 0.3 mg/dL (ref 0.2–1.2)
Total Protein: 7.5 g/dL (ref 6.0–8.3)

## 2020-05-31 LAB — VITAMIN D 25 HYDROXY (VIT D DEFICIENCY, FRACTURES): VITD: 29.75 ng/mL — ABNORMAL LOW (ref 30.00–100.00)

## 2020-05-31 LAB — LIPID PANEL
Cholesterol: 228 mg/dL — ABNORMAL HIGH (ref 0–200)
HDL: 50.1 mg/dL (ref >39.00)
LDL Cholesterol: 158 mg/dL — ABNORMAL HIGH (ref 0–99)
NonHDL: 177.96
Total CHOL/HDL Ratio: 5
Triglycerides: 102 mg/dL (ref 0.0–149.0)
VLDL: 20.4 mg/dL (ref 0.0–40.0)

## 2020-06-01 ENCOUNTER — Other Ambulatory Visit: Payer: Self-pay | Admitting: Hematology and Oncology

## 2020-06-01 DIAGNOSIS — C50512 Malignant neoplasm of lower-outer quadrant of left female breast: Secondary | ICD-10-CM

## 2020-06-04 ENCOUNTER — Other Ambulatory Visit: Payer: Self-pay

## 2020-06-04 ENCOUNTER — Ambulatory Visit (INDEPENDENT_AMBULATORY_CARE_PROVIDER_SITE_OTHER): Payer: BC Managed Care – PPO

## 2020-06-04 LAB — BASIC METABOLIC PANEL
BUN: 12 mg/dL (ref 6–23)
CO2: 32 mEq/L (ref 19–32)
Calcium: 9.6 mg/dL (ref 8.4–10.5)
Chloride: 99 mEq/L (ref 96–112)
Creatinine, Ser: 0.77 mg/dL (ref 0.40–1.20)
GFR: 93.17 mL/min (ref >60.00)
Glucose, Bld: 111 mg/dL — ABNORMAL HIGH (ref 70–99)
Potassium: 4 mEq/L (ref 3.5–5.1)
Sodium: 138 mEq/L (ref 135–145)

## 2020-06-04 MED ORDER — ATORVASTATIN CALCIUM 20 MG PO TABS: 20.0000 mg | ORAL_TABLET | Freq: Every day | ORAL | 1 refills | Status: DC

## 2020-06-04 NOTE — Assessment & Plan Note (Signed)
Ongoing issue for pt.  On Gabapentin for symptom relief

## 2020-06-04 NOTE — Assessment & Plan Note (Signed)
Deteriorated.  Pt has hx of similar and was previously on Citalopram and Venlafaxine.  Pt is willing to restart medication at a low dose and titrate prn.  Will follow closely.

## 2020-06-04 NOTE — Assessment & Plan Note (Signed)
Continues on Arimidex.

## 2020-06-04 NOTE — Assessment & Plan Note (Signed)
Ongoing issue for pt.  BMI 40.33  Encouraged healthy diet and regular exercise.  Check labs to risk stratify.  Will follow.

## 2020-06-04 NOTE — Progress Notes (Signed)
   Subjective:    Patient ID: [name], female    DOB: 1961-04-04, 59 y.o.   MRN: [id number]  HPI CPE- UTD on mammo, colonoscopy.  No need for pap due to hysterectomy.  UTD on Tdap.   Review of Systems Patient reports no vision/ hearing changes, adenopathy,fever, weight change,  persistant/recurrent hoarseness , swallowing issues, chest pain, palpitations, edema, persistant/recurrent cough, hemoptysis, dyspnea (rest/exertional/paroxysmal nocturnal), gastrointestinal bleeding (melena, rectal bleeding), abdominal pain, significant heartburn, bowel changes, GU symptoms (dysuria, hematuria, incontinence), Gyn symptoms (abnormal  bleeding, pain),  syncope, focal weakness, memory los, skin/hair/nail changes, abnormal bruising or bleeding.   + anxiety/depression- pt admits to struggling recently.  Having a hard time finding joy.  Previously on Citalopram and Venlafaxine.  Doesn't want anything that will cause weight gain or is hard to come off of. + neuropathy due to previous chemo  This visit occurred during the SARS-CoV-2 public health emergency.  Safety protocols were in place, including screening questions prior to the visit, additional usage of staff PPE, and extensive cleaning of exam room while observing appropriate contact time as indicated for disinfecting solutions.       Objective:   Physical Exam General Appearance:    Alert, cooperative, no distress, appears stated age, obese  Head:    Normocephalic, without obvious abnormality, atraumatic  Eyes:    PERRL, conjunctiva/corneas clear, EOM's intact, fundi    benign, both eyes  Ears:    Normal TM's and external ear canals, both ears  Nose:   Deferred due to COVID  Throat:   Neck:   Supple, symmetrical, trachea midline, no adenopathy;    Thyroid: no enlargement/tenderness/nodules  Back:     Symmetric, no curvature, ROM normal, no CVA tenderness  Lungs:     Clear to auscultation bilaterally, respirations unlabored  Chest Wall:    No  tenderness or deformity   Heart:    Regular rate and rhythm, S1 and S2 normal, no murmur, rub   or gallop  Breast Exam:    Deferred to GYN  Abdomen:     Soft, non-tender, bowel sounds active all four quadrants,    no masses, no organomegaly  Genitalia:    Deferred to GYN  Rectal:    Extremities:   Extremities normal, atraumatic, no cyanosis or edema  Pulses:   2+ and symmetric all extremities  Skin:   Skin color, texture, turgor normal, no rashes or lesions  Lymph nodes:   Cervical, supraclavicular, and axillary nodes normal  Neurologic:   CNII-XII intact, normal strength, sensation and reflexes    throughout          Assessment & Plan:

## 2020-06-04 NOTE — Assessment & Plan Note (Signed)
Pt's PE unchanged from previous.  UTD on mammo, colonoscopy, and immunizations.  Check labs.  Anticipatory guidance provided.

## 2020-06-04 NOTE — Assessment & Plan Note (Signed)
Check labs and replete prn. 

## 2020-06-17 ENCOUNTER — Ambulatory Visit: Payer: BC Managed Care – PPO | Admitting: Psychology

## 2020-06-25 ENCOUNTER — Other Ambulatory Visit: Payer: Self-pay | Admitting: Family Medicine

## 2020-07-02 ENCOUNTER — Other Ambulatory Visit: Payer: Self-pay | Admitting: Family Medicine

## 2020-07-05 ENCOUNTER — Ambulatory Visit: Payer: BC Managed Care – PPO | Admitting: Family Medicine

## 2020-07-12 ENCOUNTER — Ambulatory Visit: Payer: BC Managed Care – PPO | Admitting: Family Medicine

## 2020-07-19 ENCOUNTER — Other Ambulatory Visit: Payer: Self-pay

## 2020-07-19 ENCOUNTER — Encounter: Payer: Self-pay | Admitting: Family Medicine

## 2020-07-19 ENCOUNTER — Telehealth (INDEPENDENT_AMBULATORY_CARE_PROVIDER_SITE_OTHER): Payer: BC Managed Care – PPO | Admitting: Family Medicine

## 2020-07-19 DIAGNOSIS — F329 Major depressive disorder, single episode, unspecified: Secondary | ICD-10-CM

## 2020-07-19 DIAGNOSIS — F419 Anxiety disorder, unspecified: Secondary | ICD-10-CM | POA: Diagnosis not present

## 2020-07-19 DIAGNOSIS — F32A Depression, unspecified: Secondary | ICD-10-CM

## 2020-07-19 MED ORDER — FLUOXETINE HCL 20 MG PO TABS
20.0000 mg | ORAL_TABLET | Freq: Every day | ORAL | 3 refills | Status: DC
Start: 2020-07-19 — End: 2020-11-15

## 2020-07-19 NOTE — Progress Notes (Signed)
   Virtual Visit via Video   I connected with patient on 07/19/20 at  2:30 PM EDT by a video enabled telemedicine application and verified that I am speaking with the correct person using two identifiers.  Location patient: Home Location provider: Acupuncturist, Office Persons participating in the virtual visit: Patient, Provider, [place] (Jess B)  I discussed the limitations of evaluation and management by telemedicine and the availability of in person appointments. The patient expressed understanding and agreed to proceed.  Subjective:   HPI:   Anxiety/Depression- pt was started on Fluoxetine 10mg  at last visit.  Pt feels that addition of medication has been helpful.  Pt would like to go up on medication to further improve sxs.  Has upcoming therapy appt- she is excited about this.  Sleeping better.  Has 'definitely taken the edge off'.    ROS:   See pertinent positives and negatives per HPI.  Patient Active Problem List   Diagnosis Date Noted  . Hot flashes due to tamoxifen 05/25/2014  . Knee pain, bilateral 02/09/2014  . General medical examination 05/02/2012  . 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 OBSTRUCTIVE SLEEP APNEA 02/09/2011  . Breast cancer of lower-outer quadrant of left female breast ([place]) 01/14/2011  . Vitamin D deficiency 12/30/2010  . Hyperlipidemia 08/13/2009  . Anxiety and depression 08/13/2009  . HIP PAIN, RIGHT 06/29/2009  . KNEE PAIN, RIGHT 06/29/2009  . INSOMNIA 07/24/2008  . METABOLIC SYNDROME X 58/85/0277  . Morbid obesity ([place]) 07/22/2007  . Essential hypertension 07/22/2007    Social History   Tobacco Use  . Smoking status: Former Smoker    Quit date: 12/28/1989    Years since quitting: 30.5  . Smokeless tobacco: Never Used  Substance Use Topics  . Alcohol use: Yes    Comment: 1 glass of wine once a month    Current Outpatient Medications:  .  anastrozole (ARIMIDEX) 1 MG tablet, TAKE 1 TABLET BY MOUTH EVERY DAY, Disp: 90 tablet,  Rfl: 3 .  atenolol-chlorthalidone (TENORETIC) 50-25 MG tablet, TAKE 0.5 TABLETS BY MOUTH DAILY., Disp: 45 tablet, Rfl: 1 .  atorvastatin (LIPITOR) 20 MG tablet, Take 1 tablet (20 mg total) by mouth daily., Disp: 90 tablet, Rfl: 1 .  diclofenac Sodium (VOLTAREN) 1 % GEL, Apply 2 g topically 4 (four) times daily., Disp: 100 g, Rfl: 1 .  FLUoxetine (PROZAC) 10 MG capsule, Take 1 capsule (10 mg total) by mouth daily., Disp: 30 capsule, Rfl: 3 .  furosemide (LASIX) 40 MG tablet, Take 1 tablet (40 mg total) by mouth daily., Disp: 90 tablet, Rfl: 0 .  gabapentin (NEURONTIN) 400 MG capsule, TAKE 1 CAPSULE BY MOUTH 3 TIMES A DAY, Disp: 270 capsule, Rfl: 1 .  zolpidem (AMBIEN) 10 MG tablet, TAKE 1 TABLET (10 MG TOTAL) BY MOUTH AT BEDTIME AS NEEDED., Disp: 30 tablet, Rfl: 3  No Known Allergies  Objective:   There were no vitals taken for this visit. AAOx3, NAD NCAT, EOMI No obvious CN deficits Coloring WNL Pt is able to speak clearly, coherently without shortness of breath or increased work of breathing.  Thought process is linear.  Mood is appropriate.   Assessment and Plan:   Anxiety/depression- improving.  Pt feels Prozac is helpful and is interested in increasing the dose for further benefit.  New script sent for 20mg  daily.  Will follow.  Annye Asa, MD 07/19/2020

## 2020-07-19 NOTE — Progress Notes (Signed)
I have discussed the procedure for the virtual visit with the patient who has given consent to proceed with assessment and treatment.    Pt unable to obtain vitals.   States the 10mg  Prozac has helped. Would like to increase it.   Davis Gourd, CMA

## 2020-08-13 ENCOUNTER — Telehealth: Payer: Self-pay | Admitting: Hematology and Oncology

## 2020-08-13 NOTE — Telephone Encounter (Signed)
Changed appt per pt request. Confirmed 8/18 appt

## 2020-08-13 NOTE — Progress Notes (Signed)
HEMATOLOGY-ONCOLOGY MYCHART VIDEO VISIT PROGRESS NOTE  I connected with [name] on 08/14/2020 at  1:45 PM EDT by MyChart video conference and verified that I am speaking with the correct person using two identifiers.  I discussed the limitations, risks, security and privacy concerns of performing an evaluation and management service by MyChart and the availability of in person appointments.  I also discussed with the patient that there may be a patient responsible charge related to this service. The patient expressed understanding and agreed to proceed.  Patient's Location: Home Physician Location: Clinic  CHIEF COMPLIANT: Follow-up of left breast cancer on anastrozole therapy  INTERVAL HISTORY: [name] is a 59 y.o. female with above-mentioned history of left breast cancer treated with lumpectomy, adjuvant chemotherapy, radiation, and who is currently on antiestrogen therapy with anastrozole. Mammogram on 02/15/20 showed no evidence of malignancy bilaterally. She presents over MyChart today for annual follow-up.   Oncology History  Breast cancer of lower-outer quadrant of left female breast ([place])  01/20/2011 Surgery   Left breast lumpectomy: Invasive lobular carcinoma multiple foci 1.2 and 0.7 cm posterior margin and LCIS, micro met in one SLN, ER 100%, PR 100%, Ki-67 12%, HER-2 negative ratio 1.11: Oncotype DX 22, ROR14%   03/17/2011 - 07/17/2011 Chemotherapy   Adjuvant chemotherapy AC x4 followed by Taxol weekly x8   08/28/2011 - 10/13/2011 Radiation Therapy   Adjuvant radiation therapy   10/29/2011 -  Anti-estrogen oral therapy   Adjuvant tamoxifen 20 mg daily, stopped May 2016 started on anastrozole in May 2017 (and she became postmenopausal), plan of treatment 5 years     Observations/Objective:  There were no vitals filed for this visit. There is no height or weight on file to calculate BMI.  I have reviewed the data as listed CMP Latest Ref Rng & Units 06/04/2020 05/30/2020  05/29/2019  Glucose 70 - 99 mg/dL 111(H) - 102(H)  BUN 6 - 23 mg/dL 12 - 21  Creatinine 0.40 - 1.20 mg/dL 0.77 - 0.85  Sodium 135 - 145 mEq/L 138 - 137  Potassium 3.5 - 5.1 mEq/L 4.0 - 4.0  Chloride 96 - 112 mEq/L 99 - 99  CO2 19 - 32 mEq/L 32 - 31  Calcium 8.4 - 10.5 mg/dL 9.6 - 9.5  Total Protein 6.0 - 8.3 g/dL - 7.5 7.1  Total Bilirubin 0.2 - 1.2 mg/dL - 0.3 0.4  Alkaline Phos 39 - 117 U/L - 111 103  AST 0 - 37 U/L - 14 13  ALT 0 - 35 U/L - 12 13    Lab Results  Component Value Date   WBC 5.0 05/30/2020   HGB 13.1 05/30/2020   HCT 38.7 05/30/2020   MCV 92.3 05/30/2020   PLT 294.0 05/30/2020   NEUTROABS 2.4 05/30/2020      Assessment Plan:  Breast cancer of lower-outer quadrant of left female breast 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 x4 followed by Taxol x8 and status post radiation therapy. Currently on tamoxifen 20 mg daily started in November 2012 but she stopped taking it May 2015 due to severe hot flashes.   Current treatment: Anastrozole 1 mg daily started May 2017 (when she became postmenopausal), plan to continue for 7 to 10 years total duration  Anastrozole toxicities: 1. Hot flashes not as severe as tamoxifen 2.mild musculoskeletal discomforts  Breast Cancer Surveillance: 1. Breast exam8/11/2018:No palpable lumps or nodules  of concern 2. Mammogram:02/15/2020:No mammographic evidence of malignancy left-sided posttreatment changes breast density category B  I recommended that she get a bone density test along with her mammogram in February. She has received the COVID-19 vaccine. Return to clinic in 1 year for follow-up  I discussed the assessment and treatment plan with the patient. The patient was provided an opportunity to ask questions and all were answered. The patient agreed with the plan and demonstrated an  understanding of the instructions. The patient was advised to call back or seek an in-person evaluation if the symptoms worsen or if the condition fails to improve as anticipated.   I provided 20 minutes of face-to-face MyChart video visit time during this encounter.    Rulon Eisenmenger, MD 08/14/2020   I, Molly Dorshimer, am acting as scribe for Nicholas Lose, MD.  I have reviewed the above documentation for accuracy and completeness, and I agree with the above.

## 2020-08-14 ENCOUNTER — Inpatient Hospital Stay: Payer: BC Managed Care – PPO | Attending: Hematology and Oncology | Admitting: Hematology and Oncology

## 2020-08-14 DIAGNOSIS — Z170 Estrogen receptor positive status [ER+]: Secondary | ICD-10-CM

## 2020-08-14 DIAGNOSIS — C50512 Malignant neoplasm of lower-outer quadrant of left female breast: Secondary | ICD-10-CM

## 2020-08-14 NOTE — Assessment & Plan Note (Signed)
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 x4 followed by Taxol x8 and status post radiation therapy. Currently on tamoxifen 20 mg daily started in November 2012 but she stopped taking it May 2015 due to severe hot flashes.   Current treatment: Anastrozole 1 mg daily started May 2017 (when she became postmenopausal), plan to continue to 2022 Anastrozole toxicities: 1. Hot flashes not as severe as tamoxifen 2.mild musculoskeletal discomforts  Breast Cancer Surveillance: 1. Breast exam8/11/2018:No palpable lumps or nodules of concern 2. Mammogram:02/15/2020:No mammographic evidence of malignancy left-sided posttreatment changes breast density category B  Return to clinic in 1 year for follow-up

## 2020-08-15 ENCOUNTER — Other Ambulatory Visit: Payer: Self-pay

## 2020-08-20 ENCOUNTER — Other Ambulatory Visit: Payer: Self-pay | Admitting: Hematology and Oncology

## 2020-08-20 DIAGNOSIS — M810 Age-related osteoporosis without current pathological fracture: Secondary | ICD-10-CM

## 2020-08-20 DIAGNOSIS — Z1231 Encounter for screening mammogram for malignant neoplasm of breast: Secondary | ICD-10-CM

## 2020-08-20 DIAGNOSIS — C50512 Malignant neoplasm of lower-outer quadrant of left female breast: Secondary | ICD-10-CM

## 2020-09-10 ENCOUNTER — Other Ambulatory Visit: Payer: Self-pay | Admitting: Family Medicine

## 2020-09-11 NOTE — Telephone Encounter (Signed)
Last refill: 05/09/20 #30, 3 Last OV: 07/19/20 dx. Anxiety and depression

## 2020-10-12 ENCOUNTER — Ambulatory Visit: Payer: BC Managed Care – PPO | Attending: Internal Medicine

## 2020-10-12 DIAGNOSIS — Z23 Encounter for immunization: Secondary | ICD-10-CM

## 2020-10-12 NOTE — Progress Notes (Signed)
   Covid-19 Vaccination Clinic  Name:  [name]    MRN: [id number] DOB: 10/26/1961  10/12/2020  [name] was observed post Covid-19 immunization for 15 minutes without incident. She was provided with Vaccine Information Sheet and instruction to access the V-Safe system.   [name] was instructed to call 911 with any severe reactions post vaccine: Marland Kitchen Difficulty breathing  . Swelling of face and throat  . A fast heartbeat  . A bad rash all over body  . Dizziness and weakness

## 2020-11-07 ENCOUNTER — Institutional Professional Consult (permissible substitution): Payer: BC Managed Care – PPO | Admitting: Plastic Surgery

## 2020-11-14 ENCOUNTER — Other Ambulatory Visit: Payer: Self-pay

## 2020-11-14 ENCOUNTER — Encounter: Payer: Self-pay | Admitting: Plastic Surgery

## 2020-11-14 ENCOUNTER — Ambulatory Visit: Payer: BC Managed Care – PPO | Admitting: Plastic Surgery

## 2020-11-14 VITALS — BP 123/63 | HR 67 | Temp 98.8°F | Ht 62.5 in | Wt 212.8 lb

## 2020-11-14 DIAGNOSIS — L989 Disorder of the skin and subcutaneous tissue, unspecified: Secondary | ICD-10-CM

## 2020-11-14 NOTE — Progress Notes (Signed)
Referring Provider Midge Minium, MD [address] [place],  [place] [address]   CC:  Chief Complaint  Patient presents with  . Advice Only      [name] is an 59 y.o. female.  HPI: Patient presents with 2 lesions on her face that are bothersome for her.  One is in the glabella area and one is in the left nasolabial fold.  They have been present for several years and have not been biopsied.  She is interested in having them excised.  They are growing in size and occasionally catch on things as they are prominent and raised.  No Known Allergies  Outpatient Encounter Medications as of 11/14/2020  Medication Sig  . anastrozole (ARIMIDEX) 1 MG tablet TAKE 1 TABLET BY MOUTH EVERY DAY  . atenolol-chlorthalidone (TENORETIC) 50-25 MG tablet TAKE 0.5 TABLETS BY MOUTH DAILY.  Marland Kitchen diclofenac Sodium (VOLTAREN) 1 % GEL Apply 2 g topically 4 (four) times daily.  Marland Kitchen FLUoxetine (PROZAC) 20 MG tablet Take 1 tablet (20 mg total) by mouth daily.  . furosemide (LASIX) 40 MG tablet Take 1 tablet (40 mg total) by mouth daily.  Marland Kitchen gabapentin (NEURONTIN) 400 MG capsule TAKE 1 CAPSULE BY MOUTH 3 TIMES A DAY  . zolpidem (AMBIEN) 10 MG tablet TAKE 1 TABLET (10 MG TOTAL) BY MOUTH AT BEDTIME AS NEEDED.  Marland Kitchen atorvastatin (LIPITOR) 20 MG tablet Take 1 tablet (20 mg total) by mouth daily.   No facility-administered encounter medications on file as of 11/14/2020.     Past Medical History:  Diagnosis Date  . Adenocarcinoma, breast ([place])   . Anxiety   . Blood transfusion 2004  . Breast cancer ([place]) 2012   Left  . Depression   . Hyperlipidemia   . Hypertension   . Metabolic syndrome   . Obesity   . Personal history of chemotherapy   . Personal history of radiation therapy   . Vitamin D deficiency     Past Surgical History:  Procedure Laterality Date  . BREAST LUMPECTOMY Left 2012  . BTL    . CARPAL TUNNEL RELEASE    . PORT-A-CATH REMOVAL  02/19/2012   Procedure: MINOR REMOVAL PORT-A-CATH;   Surgeon: Edward Jolly, MD;  Location: [facility];  Service: General;  Laterality: Right;  . PORT-A-CATH REMOVAL  02/19/2012   Procedure: MINOR REMOVAL PORT-A-CATH;  Surgeon: Edward Jolly, MD;  Location: [facility];  Service: General;  Laterality: Right;  . TONSILLECTOMY    . TOTAL ABDOMINAL HYSTERECTOMY  2005    Family History  Problem Relation Age of Onset  . Colon cancer Maternal Uncle   . Alcohol abuse Other   . Asthma Other   . Coronary artery disease Other   . Hyperlipidemia Other   . 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     Review of Systems General: Denies fevers, chills, weight loss CV: Denies chest pain, shortness of breath, palpitations  Physical Exam Vitals with BMI 11/14/2020 05/30/2020 08/15/2019  Height 5' 2.5" 5\' 2"  -  Weight 212 lbs 13 oz 220 lbs 8 oz 219 lbs  BMI 68.11 57.26 -  Systolic 203 559 741  Diastolic 63 76 59  Pulse 67 78 75    General:  No acute distress,  Alert and oriented, Non-Toxic, Normal speech and affect Examination shows a 3  mm diameter flesh colored papule in the glabellar area.  There is a 2 mm flesh-colored papule of a similar appearance in the left nasolabial fold.  There is no surrounding skin changes or inflammation that I can detect.  Assessment/Plan Patient presents with 2 symptomatic skin lesions 1 in the glabella and one in the left nasolabial fold.  She is interested in having it excised.  We discussed excision under local in the office.  We discussed the risk that include bleeding, infection, damage to surrounding structures and need for additional procedures.  All her questions were answered and we will plan to move forward with scheduling this.  Cindra Presume 11/14/2020, 2:12 PM

## 2020-11-15 ENCOUNTER — Other Ambulatory Visit: Payer: Self-pay | Admitting: Family Medicine

## 2020-12-05 ENCOUNTER — Other Ambulatory Visit: Payer: Self-pay | Admitting: Family Medicine

## 2020-12-09 ENCOUNTER — Other Ambulatory Visit: Payer: Self-pay | Admitting: Family Medicine

## 2020-12-09 NOTE — Telephone Encounter (Signed)
Medication last prescribed on 01/02/20. Not on patient's current medication list. Last office visit on 07/19/20. Okay to fill?

## 2021-01-01 ENCOUNTER — Encounter: Payer: Self-pay | Admitting: Plastic Surgery

## 2021-01-01 ENCOUNTER — Other Ambulatory Visit: Payer: Self-pay

## 2021-01-01 ENCOUNTER — Ambulatory Visit: Payer: BC Managed Care – PPO | Admitting: Plastic Surgery

## 2021-01-01 ENCOUNTER — Other Ambulatory Visit (HOSPITAL_COMMUNITY)
Admission: RE | Admit: 2021-01-01 | Discharge: 2021-01-01 | Disposition: A | Discharge status: Home / Self Care | Payer: BC Managed Care – PPO | Source: Ambulatory Visit | Attending: Plastic Surgery | Admitting: Plastic Surgery

## 2021-01-01 VITALS — BP 124/67 | HR 81 | Temp 98.1°F

## 2021-01-01 DIAGNOSIS — D235 Other benign neoplasm of skin of trunk: Secondary | ICD-10-CM

## 2021-01-01 DIAGNOSIS — L989 Disorder of the skin and subcutaneous tissue, unspecified: Secondary | ICD-10-CM | POA: Diagnosis not present

## 2021-01-01 DIAGNOSIS — D2330 Other benign neoplasm of skin of unspecified part of face: Secondary | ICD-10-CM

## 2021-01-01 NOTE — Progress Notes (Signed)
Operative Note   DATE OF OPERATION: 01/01/2021  LOCATION:    SURGICAL DEPARTMENT: Plastic Surgery  PREOPERATIVE DIAGNOSES: Skin lesions of the left cheek, right forehead and right upper back  POSTOPERATIVE DIAGNOSES:  same  PROCEDURE:  1. Excision of left cheek skin lesion measuring 2 cm 2. Excision of the right forehead skin lesion measuring 2 cm 3. Shave excision of right upper back skin tag totaling 6 mm in size 4. Complex closure on the face measuring 4 cm  SURGEON: Ancil Linsey, MD  ANESTHESIA:  Local  COMPLICATIONS: None.   INDICATIONS FOR PROCEDURE:  The patient, [name] is a 60 y.o. female born on 1961-06-17, is here for treatment of multiple skin lesions MRN: [id number]  CONSENT:  Informed consent was obtained directly from the patient. Risks, benefits and alternatives were fully discussed. Specific risks including but not limited to bleeding, infection, hematoma, seroma, scarring, pain, infection, wound healing problems, and need for further surgery were all discussed. The patient did have an ample opportunity to have questions answered to satisfaction.   DESCRIPTION OF PROCEDURE:  Local anesthesia was administered. The patient's operative site was prepped and draped in a sterile fashion. A time out was performed and all information was confirmed to be correct.  The lesion was excised with a 15 blade.  Hemostasis was obtained.  Circumferential undermining was performed and the skin was advanced and closed in layers with interrupted buried Monocryl sutures and 5-0 fast gut for the skin.  The upper back skin lesion was shaved at the base with a 15 blade  The patient tolerated the procedure well.  There were no complications.

## 2021-01-03 LAB — SURGICAL PATHOLOGY

## 2021-01-08 ENCOUNTER — Other Ambulatory Visit: Payer: Self-pay | Admitting: Family Medicine

## 2021-01-09 NOTE — Telephone Encounter (Signed)
Requesting: Ambien 10mg  Contract: UDS: Last Visit:07/19/20 Next Visit:n/a Last Refill:09/11/20  Please Advise

## 2021-02-08 ENCOUNTER — Other Ambulatory Visit: Payer: Self-pay | Admitting: Physician Assistant

## 2021-02-12 ENCOUNTER — Other Ambulatory Visit: Payer: Self-pay | Admitting: Family Medicine

## 2021-02-12 DIAGNOSIS — G4700 Insomnia, unspecified: Secondary | ICD-10-CM

## 2021-02-12 MED ORDER — ZOLPIDEM TARTRATE 10 MG PO TABS
10.0000 mg | ORAL_TABLET | Freq: Every evening | ORAL | 3 refills | Status: DC | PRN
Start: 2021-02-12 — End: 2021-06-03

## 2021-02-12 NOTE — Telephone Encounter (Signed)
Ambien last rx 01/09/21 #30 LOV: 07/19/20 Anxiety and Depression

## 2021-02-14 ENCOUNTER — Other Ambulatory Visit: Payer: Self-pay | Admitting: Hematology and Oncology

## 2021-02-17 ENCOUNTER — Other Ambulatory Visit: Payer: Self-pay

## 2021-02-17 ENCOUNTER — Other Ambulatory Visit: Payer: BC Managed Care – PPO

## 2021-02-17 ENCOUNTER — Ambulatory Visit
Admission: RE | Admit: 2021-02-17 | Discharge: 2021-02-17 | Disposition: A | Discharge status: Home / Self Care | Payer: BC Managed Care – PPO | Source: Ambulatory Visit | Attending: Hematology and Oncology | Admitting: Hematology and Oncology

## 2021-02-17 DIAGNOSIS — Z1231 Encounter for screening mammogram for malignant neoplasm of breast: Secondary | ICD-10-CM

## 2021-02-24 ENCOUNTER — Other Ambulatory Visit: Payer: Self-pay | Admitting: Hematology and Oncology

## 2021-02-24 DIAGNOSIS — R928 Other abnormal and inconclusive findings on diagnostic imaging of breast: Secondary | ICD-10-CM

## 2021-03-03 ENCOUNTER — Other Ambulatory Visit: Payer: Self-pay | Admitting: Family Medicine

## 2021-03-10 ENCOUNTER — Ambulatory Visit
Admission: RE | Admit: 2021-03-10 | Discharge: 2021-03-10 | Disposition: A | Discharge status: Home / Self Care | Payer: BC Managed Care – PPO | Source: Ambulatory Visit | Attending: Hematology and Oncology | Admitting: Hematology and Oncology

## 2021-03-10 ENCOUNTER — Other Ambulatory Visit: Payer: Self-pay

## 2021-03-10 ENCOUNTER — Ambulatory Visit: Payer: BC Managed Care – PPO

## 2021-03-10 ENCOUNTER — Other Ambulatory Visit: Payer: Self-pay | Admitting: Hematology and Oncology

## 2021-03-10 DIAGNOSIS — R928 Other abnormal and inconclusive findings on diagnostic imaging of breast: Secondary | ICD-10-CM

## 2021-03-10 DIAGNOSIS — R921 Mammographic calcification found on diagnostic imaging of breast: Secondary | ICD-10-CM

## 2021-03-17 ENCOUNTER — Ambulatory Visit
Admission: RE | Admit: 2021-03-17 | Discharge: 2021-03-17 | Disposition: A | Discharge status: Home / Self Care | Payer: BC Managed Care – PPO | Source: Ambulatory Visit | Attending: Hematology and Oncology | Admitting: Hematology and Oncology

## 2021-03-17 ENCOUNTER — Other Ambulatory Visit: Payer: Self-pay

## 2021-03-17 DIAGNOSIS — R921 Mammographic calcification found on diagnostic imaging of breast: Secondary | ICD-10-CM

## 2021-05-07 ENCOUNTER — Other Ambulatory Visit: Payer: Self-pay | Admitting: Family Medicine

## 2021-05-23 ENCOUNTER — Other Ambulatory Visit: Payer: Self-pay

## 2021-05-23 ENCOUNTER — Ambulatory Visit
Admission: RE | Admit: 2021-05-23 | Discharge: 2021-05-23 | Disposition: A | Discharge status: Home / Self Care | Payer: BC Managed Care – PPO | Source: Ambulatory Visit | Attending: Hematology and Oncology | Admitting: Hematology and Oncology

## 2021-05-23 DIAGNOSIS — M810 Age-related osteoporosis without current pathological fracture: Secondary | ICD-10-CM

## 2021-06-03 ENCOUNTER — Other Ambulatory Visit: Payer: Self-pay | Admitting: Family Medicine

## 2021-06-03 DIAGNOSIS — G4700 Insomnia, unspecified: Secondary | ICD-10-CM

## 2021-06-03 NOTE — Telephone Encounter (Signed)
Requesting: Ambien 10mg  Contract: UDS: Last Visit:07/19/20 v/v Next Visit:06/26/21 Last Refill:02/12/21 30 tabs 3 refills  Please Advise

## 2021-06-06 ENCOUNTER — Other Ambulatory Visit: Payer: Self-pay | Admitting: Family Medicine

## 2021-06-25 ENCOUNTER — Encounter: Payer: Self-pay | Admitting: *Deleted

## 2021-06-26 ENCOUNTER — Other Ambulatory Visit: Payer: Self-pay

## 2021-06-26 ENCOUNTER — Encounter: Payer: Self-pay | Admitting: Family Medicine

## 2021-06-26 ENCOUNTER — Ambulatory Visit (INDEPENDENT_AMBULATORY_CARE_PROVIDER_SITE_OTHER): Payer: BC Managed Care – PPO | Admitting: Family Medicine

## 2021-06-26 VITALS — BP 122/78 | HR 68 | Temp 97.2°F | Resp 17 | Ht 62.0 in | Wt 224.0 lb

## 2021-06-26 DIAGNOSIS — Z1159 Encounter for screening for other viral diseases: Secondary | ICD-10-CM

## 2021-06-26 DIAGNOSIS — Z0000 Encounter for general adult medical examination without abnormal findings: Secondary | ICD-10-CM

## 2021-06-26 DIAGNOSIS — Z0001 Encounter for general adult medical examination with abnormal findings: Secondary | ICD-10-CM | POA: Diagnosis not present

## 2021-06-26 DIAGNOSIS — G4700 Insomnia, unspecified: Secondary | ICD-10-CM

## 2021-06-26 DIAGNOSIS — Z170 Estrogen receptor positive status [ER+]: Secondary | ICD-10-CM

## 2021-06-26 DIAGNOSIS — Z114 Encounter for screening for human immunodeficiency virus [HIV]: Secondary | ICD-10-CM

## 2021-06-26 DIAGNOSIS — E8881 Metabolic syndrome: Secondary | ICD-10-CM | POA: Diagnosis not present

## 2021-06-26 DIAGNOSIS — C50512 Malignant neoplasm of lower-outer quadrant of left female breast: Secondary | ICD-10-CM

## 2021-06-26 DIAGNOSIS — E559 Vitamin D deficiency, unspecified: Secondary | ICD-10-CM

## 2021-06-26 DIAGNOSIS — G620 Drug-induced polyneuropathy: Secondary | ICD-10-CM | POA: Diagnosis not present

## 2021-06-26 LAB — BASIC METABOLIC PANEL
BUN: 17 mg/dL (ref 6–23)
CO2: 31 mEq/L (ref 19–32)
Calcium: 9.7 mg/dL (ref 8.4–10.5)
Chloride: 100 mEq/L (ref 96–112)
Creatinine, Ser: 0.82 mg/dL (ref 0.40–1.20)
GFR: 78.51 mL/min (ref >60.00)
Glucose, Bld: 95 mg/dL (ref 70–99)
Potassium: 3.6 mEq/L (ref 3.5–5.1)
Sodium: 140 mEq/L (ref 135–145)

## 2021-06-26 LAB — LIPID PANEL
Cholesterol: 161 mg/dL (ref 0–200)
HDL: 50 mg/dL (ref >39.00)
LDL Cholesterol: 101 mg/dL — ABNORMAL HIGH (ref 0–99)
NonHDL: 110.75
Total CHOL/HDL Ratio: 3
Triglycerides: 51 mg/dL (ref 0.0–149.0)
VLDL: 10.2 mg/dL (ref 0.0–40.0)

## 2021-06-26 LAB — CBC WITH DIFFERENTIAL/PLATELET
Basophils Absolute: 0 10*3/uL (ref 0.0–0.1)
Basophils Relative: 1 % (ref 0.0–3.0)
Eosinophils Absolute: 0.2 10*3/uL (ref 0.0–0.7)
Eosinophils Relative: 4.9 % (ref 0.0–5.0)
HCT: 39 % (ref 36.0–46.0)
Hemoglobin: 13.2 g/dL (ref 12.0–15.0)
Lymphocytes Relative: 32.1 % (ref 12.0–46.0)
Lymphs Abs: 1.4 10*3/uL (ref 0.7–4.0)
MCHC: 33.9 g/dL (ref 30.0–36.0)
MCV: 90.5 fl (ref 78.0–100.0)
Monocytes Absolute: 0.3 10*3/uL (ref 0.1–1.0)
Monocytes Relative: 7.1 % (ref 3.0–12.0)
Neutro Abs: 2.4 10*3/uL (ref 1.4–7.7)
Neutrophils Relative %: 54.9 % (ref 43.0–77.0)
Platelets: 278 10*3/uL (ref 150.0–400.0)
RBC: 4.31 Mil/uL (ref 3.87–5.11)
RDW: 13.5 % (ref 11.5–15.5)
WBC: 4.5 10*3/uL (ref 4.0–10.5)

## 2021-06-26 LAB — HEPATIC FUNCTION PANEL
ALT: 13 U/L (ref 0–35)
AST: 15 U/L (ref 0–37)
Albumin: 4.3 g/dL (ref 3.5–5.2)
Alkaline Phosphatase: 114 U/L (ref 39–117)
Bilirubin, Direct: 0.1 mg/dL (ref 0.0–0.3)
Total Bilirubin: 0.7 mg/dL (ref 0.2–1.2)
Total Protein: 7.5 g/dL (ref 6.0–8.3)

## 2021-06-26 LAB — VITAMIN D 25 HYDROXY (VIT D DEFICIENCY, FRACTURES): VITD: 29.45 ng/mL — ABNORMAL LOW (ref 30.00–100.00)

## 2021-06-26 LAB — TSH: TSH: 0.55 u[IU]/mL (ref 0.35–5.50)

## 2021-06-26 MED ORDER — FUROSEMIDE 40 MG PO TABS: 40.0000 mg | ORAL_TABLET | Freq: Every day | ORAL | 0 refills | Status: DC

## 2021-06-26 MED ORDER — OZEMPIC (0.25 OR 0.5 MG/DOSE) 2 MG/1.5ML ~~LOC~~ SOPN: 0.5000 mg | PEN_INJECTOR | SUBCUTANEOUS | 0 refills | Status: DC

## 2021-06-26 NOTE — Assessment & Plan Note (Addendum)
Ongoing issue for pt since taking chemo for breast cancer.  Encouraged her to discuss gabapentin dose w/ Oncology.

## 2021-06-26 NOTE — Progress Notes (Signed)
   Subjective:    Patient ID: [name], female    DOB: 02-27-1961, 60 y.o.   MRN: [id number]  HPI CPE- UTD on colonoscopy, mammo, DEXA.  UTD on Tdap, COVID.  Reviewed past medical, surgical, family and social histories.   Patient Care Team    Relationship Specialty Notifications Start End  Midge Minium, MD PCP - General   12/10/10   Servando Salina, MD Consulting Physician Obstetrics and Gynecology  05/22/15   Nicholas Lose, MD Consulting Physician Hematology and Oncology  05/22/15     Health Maintenance  Topic Date Due   Pneumococcal Vaccine 20-72 Years old (1 - PCV) Never done   Zoster Vaccines- Shingrix (1 of 2) Never done   Hepatitis C Screening  06/26/2022 (Originally 06/16/1980)   HIV Screening  06/26/2022 (Originally 06/16/1977)   INFLUENZA VACCINE  07/28/2021   COVID-19 Vaccine (5 - Booster for Pfizer series) 09/15/2021   MAMMOGRAM  02/17/2022   TETANUS/TDAP  05/02/2022   COLONOSCOPY (Pts 45-53yrs Insurance coverage will need to be confirmed)  07/08/2022   HPV VACCINES  Aged Out      Review of Systems Patient reports no vision/ hearing changes, adenopathy,fever,  persistant/recurrent hoarseness , swallowing issues, chest pain, palpitations, edema, persistant/recurrent cough, hemoptysis, dyspnea (rest/exertional/paroxysmal nocturnal), gastrointestinal bleeding (melena, rectal bleeding), abdominal pain, significant heartburn, bowel changes, GU symptoms (dysuria, hematuria, incontinence), Gyn symptoms (abnormal  bleeding, pain),  syncope, focal weakness, memory loss, skin/hair/nail changes, abnormal bruising or bleeding, anxiety, or depression.   +11 lb weight gain- pt is interested in weight loss medication to take in addition to changing diet and adding exercise + chemo neuropathy    Objective:   Physical Exam General Appearance:    Alert, cooperative, no distress, appears stated age  Head:    Normocephalic, without obvious abnormality, atraumatic  Eyes:     PERRL, conjunctiva/corneas clear, EOM's intact, fundi    benign, both eyes  Ears:    Normal TM's and external ear canals, both ears  Nose:   Deferred due to COVID  Throat:   Neck:   Supple, symmetrical, trachea midline, no adenopathy;    Thyroid: no enlargement/tenderness/nodules  Back:     Symmetric, no curvature, ROM normal, no CVA tenderness  Lungs:     Clear to auscultation bilaterally, respirations unlabored  Chest Wall:    No tenderness or deformity   Heart:    Regular rate and rhythm, S1 and S2 normal, no murmur, rub   or gallop  Breast Exam:    Deferred to GYN  Abdomen:     Soft, non-tender, bowel sounds active all four quadrants,    no masses, no organomegaly  Genitalia:    Deferred to GYN  Rectal:    Extremities:   Extremities normal, atraumatic, no cyanosis or edema  Pulses:   2+ and symmetric all extremities  Skin:   Skin color, texture, turgor normal, no rashes or lesions  Lymph nodes:   Cervical, supraclavicular, and axillary nodes normal  Neurologic:   CNII-XII intact, normal strength, sensation and reflexes    throughout          Assessment & Plan:

## 2021-06-26 NOTE — Assessment & Plan Note (Signed)
Deteriorated.  Pt has gained 11 lbs since last visit.  Since she has Metabolic syndrome, we discussed various weight loss medication and decided to start Ozempic.  Discuss appropriate use, black box warning, and possible side effects.  Pt is aware that this is in addition to diet and exercise.  Will follow.

## 2021-06-26 NOTE — Assessment & Plan Note (Signed)
Check labs and replete prn. 

## 2021-06-26 NOTE — Patient Instructions (Addendum)
Follow up in 6 months to recheck BP, cholesterol, weight loss progress We'll notify you of your lab results and make any changes if needed Continue to work on healthy diet and regular exercise- you can do it! Consider swimming to improve health and fitness START the Ozempic as directed Start 1mg  Melatonin 1 hr before your desired bedtime Ask Dr Lindi Adie about changing your gabapentin dose to help w/ the neuropathy Call with any questions or concerns Stay Safe!  Stay Healthy! Have a great summer!!!

## 2021-06-26 NOTE — Assessment & Plan Note (Signed)
Pt's PE WNL w/ exception of obesity.  UTD on colonoscopy, mammo, DEXA, and immunizations.  Declines Shingles vaccine at this time.  Check labs.  Anticipatory guidance provided.

## 2021-06-26 NOTE — Assessment & Plan Note (Signed)
Refill provided on Ambien.  Pt still struggles w/ sleep.  Add 1mg  Melatonin nightly to improve sleep/wake cycle

## 2021-06-26 NOTE — Assessment & Plan Note (Signed)
On Anastrozole and following w/ Dr Lindi Adie

## 2021-06-27 LAB — HEPATITIS C ANTIBODY
Hepatitis C Ab: NONREACTIVE
SIGNAL TO CUT-OFF: 0.08 (ref <1.00)

## 2021-06-27 LAB — HIV ANTIBODY (ROUTINE TESTING W REFLEX): HIV 1&2 Ab, 4th Generation: NONREACTIVE

## 2021-08-09 ENCOUNTER — Other Ambulatory Visit: Payer: Self-pay | Admitting: Hematology and Oncology

## 2021-08-09 DIAGNOSIS — C50512 Malignant neoplasm of lower-outer quadrant of left female breast: Secondary | ICD-10-CM

## 2021-08-13 NOTE — Progress Notes (Signed)
Patient Care Team: Midge Minium, MD as PCP - General Servando Salina, MD as Consulting Physician (Obstetrics and Gynecology) Nicholas Lose, MD as Consulting Physician (Hematology and Oncology)  DIAGNOSIS:    ICD-10-CM   1. Malignant neoplasm of lower-outer quadrant of left breast of female, estrogen receptor positive ([place])  C50.512    Z17.0       SUMMARY OF ONCOLOGIC HISTORY: Oncology History  Breast cancer of lower-outer quadrant of left female breast ([place])  01/20/2011 Surgery   Left breast lumpectomy: Invasive lobular carcinoma multiple foci 1.2 and 0.7 cm posterior margin and LCIS, micro met in one SLN, ER 100%, PR 100%, Ki-67 12%, HER-2 negative ratio 1.11: Oncotype DX 22, ROR14%   03/17/2011 - 07/17/2011 Chemotherapy   Adjuvant chemotherapy AC x4 followed by Taxol weekly x8   08/28/2011 - 10/13/2011 Radiation Therapy   Adjuvant radiation therapy   10/29/2011 -  Anti-estrogen oral therapy   Adjuvant tamoxifen 20 mg daily, stopped May 2016 started on anastrozole in May 2017 (and she became postmenopausal), plan of treatment 5 years     CHIEF COMPLIANT: Follow-up of left breast cancer on anastrozole therapy  INTERVAL HISTORY: [name] is a 60 y.o. with above-mentioned history of left breast cancer treated with lumpectomy, adjuvant chemotherapy, radiation, and who is currently on antiestrogen therapy with anastrozole. Mammogram on 03/10/21 showed indeterminate 7 mm group of calcifications in the upper outer posterior right breast. Biopsy of the right breast on 03/17/21 was negative for carcinoma. She presents to the clinic today for follow-up.   ALLERGIES:  has No Known Allergies.  MEDICATIONS:  Current Outpatient Medications  Medication Sig Dispense Refill   anastrozole (ARIMIDEX) 1 MG tablet TAKE 1 TABLET BY MOUTH EVERY DAY 90 tablet 3   atenolol-chlorthalidone (TENORETIC) 50-25 MG tablet TAKE 0.5 TABLETS BY MOUTH DAILY. 45 tablet 1   atorvastatin (LIPITOR)  20 MG tablet TAKE 1 TABLET BY MOUTH EVERY DAY 90 tablet 1   diclofenac Sodium (VOLTAREN) 1 % GEL Apply 2 g topically 4 (four) times daily. 100 g 1   furosemide (LASIX) 40 MG tablet Take 1 tablet (40 mg total) by mouth daily. 90 tablet 0   gabapentin (NEURONTIN) 400 MG capsule TAKE 1 CAPSULE BY MOUTH 3 TIMES A DAY 270 capsule 1   Semaglutide,0.25 or 0.5MG/DOS, (OZEMPIC, 0.25 OR 0.5 MG/DOSE,) 2 MG/1.5ML SOPN Inject 0.5 mg into the skin once a week. 6 mL 0   zolpidem (AMBIEN) 10 MG tablet TAKE 1 TABLET BY MOUTH AT BEDTIME AS NEEDED. 30 tablet 3   No current facility-administered medications for this visit.    PHYSICAL EXAMINATION: ECOG PERFORMANCE STATUS: 1 - Symptomatic but completely ambulatory  Vitals:   08/14/21 1344  BP: (!) 119/58  Pulse: 85  Resp: 18  Temp: 97.8 F (36.6 C)  SpO2: 100%   Filed Weights   08/14/21 1344  Weight: 221 lb (100.2 kg)    BREAST: No palpable masses or nodules in either right or left breasts. No palpable axillary supraclavicular or infraclavicular adenopathy no breast tenderness or nipple discharge. (exam performed in the presence of a chaperone)  LABORATORY DATA:  I have reviewed the data as listed CMP Latest Ref Rng & Units 06/26/2021 06/04/2020 05/30/2020  Glucose 70 - 99 mg/dL 95 111(H) -  BUN 6 - 23 mg/dL 17 12 -  Creatinine 0.40 - 1.20 mg/dL 0.82 0.77 -  Sodium 135 - 145 mEq/L 140 138 -  Potassium 3.5 - 5.1 mEq/L 3.6 4.0 -  Chloride 96 - 112 mEq/L 100 99 -  CO2 19 - 32 mEq/L 31 32 -  Calcium 8.4 - 10.5 mg/dL 9.7 9.6 -  Total Protein 6.0 - 8.3 g/dL 7.5 - 7.5  Total Bilirubin 0.2 - 1.2 mg/dL 0.7 - 0.3  Alkaline Phos 39 - 117 U/L 114 - 111  AST 0 - 37 U/L 15 - 14  ALT 0 - 35 U/L 13 - 12    Lab Results  Component Value Date   WBC 4.5 06/26/2021   HGB 13.2 06/26/2021   HCT 39.0 06/26/2021   MCV 90.5 06/26/2021   PLT 278.0 06/26/2021   NEUTROABS 2.4 06/26/2021    ASSESSMENT & PLAN:  Breast cancer of lower-outer quadrant of left female  breast Left breast cancer stage II invasive lobular with one positive lymph node for micrometastatic disease status post lumpectomy followed by adjuvant chemotherapy with a.c. x4 followed by Taxol x8 and status post radiation therapy. Currently on tamoxifen 20 mg daily started in November 2012 but she stopped taking it May 2015 due to severe hot flashes.    Current treatment: Anastrozole 1 mg daily started May 2017 (when she became postmenopausal), plan to continue for 7 to 10 years total duration   Anastrozole toxicities: 1. Hot flashes not as severe as tamoxifen 2. mild musculoskeletal discomforts   Breast Cancer Surveillance: 1. Breast exam 08/14/2021: No palpable lumps or nodules of concern 2. Mammogram: 02/21/2021: Asymmetry in the right breast, 7 mm group of calcifications you okay you right breast: Biopsy: Fibroadenoma 03/18/2021  Severe chemo induced peripheral neuropathy: Currently on gabapentin 4 mg 3 times daily.  We discussed about participating in the neuropathy clinical trial. Will provide her information on the trial and see if she is interested.  Return to clinic in 1 year for follow-up      No orders of the defined types were placed in this encounter.  The patient has a good understanding of the overall plan. she agrees with it. she will call with any problems that may develop before the next visit here.  Total time spent: 20 mins including face to face time and time spent for planning, charting and coordination of care  Rulon Eisenmenger, MD, MPH 08/14/2021  I, Thana Ates, am acting as scribe for Dr. Nicholas Lose.  I have reviewed the above documentation for accuracy and completeness, and I agree with the above.

## 2021-08-14 ENCOUNTER — Inpatient Hospital Stay: Payer: BC Managed Care – PPO | Attending: Hematology and Oncology | Admitting: Hematology and Oncology

## 2021-08-14 ENCOUNTER — Other Ambulatory Visit: Payer: Self-pay

## 2021-08-14 ENCOUNTER — Encounter: Payer: Self-pay | Admitting: Emergency Medicine

## 2021-08-14 DIAGNOSIS — Z9221 Personal history of antineoplastic chemotherapy: Secondary | ICD-10-CM | POA: Diagnosis not present

## 2021-08-14 DIAGNOSIS — R232 Flushing: Secondary | ICD-10-CM | POA: Diagnosis not present

## 2021-08-14 DIAGNOSIS — C50512 Malignant neoplasm of lower-outer quadrant of left female breast: Secondary | ICD-10-CM

## 2021-08-14 DIAGNOSIS — Z79811 Long term (current) use of aromatase inhibitors: Secondary | ICD-10-CM | POA: Diagnosis not present

## 2021-08-14 DIAGNOSIS — G620 Drug-induced polyneuropathy: Secondary | ICD-10-CM | POA: Insufficient documentation

## 2021-08-14 DIAGNOSIS — Z170 Estrogen receptor positive status [ER+]: Secondary | ICD-10-CM | POA: Insufficient documentation

## 2021-08-14 DIAGNOSIS — T451X5A Adverse effect of antineoplastic and immunosuppressive drugs, initial encounter: Secondary | ICD-10-CM | POA: Insufficient documentation

## 2021-08-14 DIAGNOSIS — Z923 Personal history of irradiation: Secondary | ICD-10-CM | POA: Diagnosis not present

## 2021-08-14 MED ORDER — GABAPENTIN 400 MG PO CAPS: 400.0000 mg | ORAL_CAPSULE | Freq: Three times a day (TID) | ORAL | 3 refills | Status: DC

## 2021-08-14 NOTE — Research (Signed)
[id number] - TREATMENT OF ESTABLISHED CHEMOTHERAPY-INDUCED NEUROPATHY WITH N-PALMITOYLETHANOLAMIDE, A CANNABIMIMETIC NUTRACEUTICAL: A RANDOMIZED DOUBLE-BLIND PHASE II PILOT TRIAL  Patient [name] was identified by Dr. Lindi Adie as a potential candidate for the above listed study.  This Clinical Research Coordinator met with LARINDA HERTER, MRN [id number], on 08/14/21 in a manner and location that ensures patient privacy to discuss participation in the above listed research study.  Patient is Unaccompanied.  A copy of the informed consent document with embedded HIPAA language was provided to the patient.  Patient reads, speaks, and understands [place].    Patient was provided with the business card of this Coordinator and encouraged to contact the research team with any questions.  Approximately 15 minutes were spent with the patient reviewing the informed consent documents.  Patient was provided the option of taking informed consent documents home to review and was encouraged to review at their convenience with their support network, including other care providers. Patient took the consent documents home to review.   Clabe Seal Clinical Research Coordinator I  08/14/21 2:25 PM

## 2021-08-14 NOTE — Assessment & Plan Note (Signed)
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 x4 followed by Taxol x8 and status post radiation therapy. Currently on tamoxifen 20 mg daily started in November 2012 but she stopped taking it May 2015 due to severe hot flashes.   Current treatment: Anastrozole 1 mg daily started May 2017 (when she became postmenopausal), plan to continue for 7 to 10 years total duration  Anastrozole toxicities: 1. Hot flashes not as severe as tamoxifen 2.mild musculoskeletal discomforts  Breast Cancer Surveillance: 1. Breast exam8/18/2022:No palpable lumps or nodules of concern 2. Mammogram:02/21/2021:Asymmetry in the right breast, 7 mm group of calcifications you okay you right breast: Biopsy: Fibroadenoma 03/18/2021  Return to clinic in 1 year for follow-up

## 2021-08-18 ENCOUNTER — Telehealth: Payer: Self-pay | Admitting: Emergency Medicine

## 2021-08-18 NOTE — Telephone Encounter (Signed)
[facility]-2102 - TREATMENT OF ESTABLISHED CHEMOTHERAPY-INDUCED NEUROPATHY WITH N-PALMITOYLETHANOLAMIDE, A CANNABIMIMETIC NUTRACEUTICAL: A RANDOMIZED DOUBLE-BLIND PHASE II PILOT TRIAL  08/18/21  10:05am: Called to follow up concerning interest in participating in this research study.  Patient states she is interested in participating in this study.  She confirmed that she has read the informed consent form and denies questions at this time.  Patient is aware she would need to stop gabapentin at the direction of Dr. Lindi Adie at least 1 week prior to starting study medication.  Discussed study procedures and duration of this study.    Scheduled research visit tomorrow at 1:30pm to review consent documents together in person and obtain signature to consent if she chooses to participate.  The patient was send an email with a copy of the informed consent document at her request.  She was advised to call with any questions.  Clabe Seal Clinical Research Coordinator I  08/18/21 10:23 AM

## 2021-08-19 ENCOUNTER — Other Ambulatory Visit: Payer: Self-pay

## 2021-08-19 ENCOUNTER — Inpatient Hospital Stay: Payer: BC Managed Care – PPO | Admitting: Emergency Medicine

## 2021-08-19 DIAGNOSIS — Z170 Estrogen receptor positive status [ER+]: Secondary | ICD-10-CM

## 2021-08-19 DIAGNOSIS — C50512 Malignant neoplasm of lower-outer quadrant of left female breast: Secondary | ICD-10-CM

## 2021-08-19 NOTE — Research (Signed)
[id number] - TREATMENT OF ESTABLISHED CHEMOTHERAPY-INDUCED NEUROPATHY WITH N-PALMITOYLETHANOLAMIDE, A CANNABIMIMETIC NUTRACEUTICAL: A RANDOMIZED DOUBLE-BLIND PHASE II PILOT TRIAL  08/19/21   Consent: Patient [name] was identified by Dr. Lindi Adie as a potential candidate for the above listed study.  This Clinical Research Coordinator met with [name], MRN [id number] on 08/19/21 in a manner and location that ensures patient privacy to discuss participation in the above listed research study.  Patient is Unaccompanied.  Patient was previously provided with informed consent documents.  Patient confirmed they have read the informed consent documents.  As outlined in the informed consent form, this Coordinator and [name] discussed the purpose of the research study, the investigational nature of the study, study procedures and requirements for study participation, potential risks and benefits of study participation, as well as alternatives to participation.  This study is blinded or double-blinded. The patient understands participation is voluntary and they may withdraw from study participation at any time.  Each study arm was reviewed, and randomization discussed.  Potential side effects were reviewed with patient as outlined in the consent form, and patient made aware there may be side effects not yet known. The chance of receiving placebo was discussed. Patient understands enrollment is pending full eligibility review.   Confidentiality and how the patient's information will be used as part of study participation were discussed.  Patient was informed there is not reimbursement provided for their time and effort spent on trial participation.  The patient is encouraged to discuss research study participation with their insurance provider to determine what costs they may incur as part of study participation, including research related injury.    All questions were answered to patient's  satisfaction.  The informed consent with embedded HIPAA language was reviewed page by page.  The patient's mental and emotional status is appropriate to provide informed consent, and the patient verbalizes an understanding of study participation.  Patient has agreed to participate in the above listed research study and has voluntarily signed the informed consent version date 02/14/2021 with embedded HIPAA language on 08/19/21 at 1:51PM.  The patient was provided with a copy of the signed informed consent form with embedded HIPAA language for their reference.  No study specific procedures were obtained prior to the signing of the informed consent document.  Approximately 20 minutes were spent with the patient reviewing the informed consent documents.   Dr. Lindi Adie was informed of patient's consenting to this study and desire to participate.  He will instruct patient on how to stop gabapentin.  Plan:  Will go over next steps of the study via phone call once she receives instructions on how to stop gabapentin.   [place] Coordinator I  08/19/21 4:35 PM

## 2021-08-26 ENCOUNTER — Telehealth: Payer: Self-pay | Admitting: Emergency Medicine

## 2021-08-26 NOTE — Telephone Encounter (Signed)
[facility]U-[facility]-2102 - TREATMENT OF ESTABLISHED CHEMOTHERAPY-INDUCED NEUROPATHY WITH N-PALMITOYLETHANOLAMIDE, A CANNABIMIMETIC NUTRACEUTICAL: A RANDOMIZED DOUBLE-BLIND PHASE II PILOT TRIAL  08/26/21  2:00pm:  Called to check in with patient concerning next steps for this study.  She was instructed how to taper off of gabapentin by Dr. Geralyn Flash nurse office last week.  Patient did not answer, left voicemail requesting with this coordinator's contact information.   Clabe Seal Clinical Research Coordinator I  08/26/21  2:18 PM

## 2021-09-03 ENCOUNTER — Telehealth: Payer: Self-pay | Admitting: Emergency Medicine

## 2021-09-03 NOTE — Telephone Encounter (Signed)
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  The patient states she has stopped gabapentin according to instructions by Dr. Geralyn Flash nurse.  She reports taking her last dose of gabapentin on 08/24/2021.  She currently rates her neuropathy symptoms at a 10 on a scale of 0-10 in the past week.  Patient states she wants to continue with participation in this trial.  Scheduled appointment for Monday 09/08/21 at 1:30pm to dispense medication.  Patient was instructed to complete baseline questionnaire that day and bring it with her to her appointment.  Patient denied further questions at this time.  Clabe Seal Clinical Research Coordinator I  09/03/21  2:35 PM

## 2021-09-08 ENCOUNTER — Other Ambulatory Visit: Payer: Self-pay

## 2021-09-08 ENCOUNTER — Encounter: Payer: Self-pay | Admitting: Hematology and Oncology

## 2021-09-08 ENCOUNTER — Inpatient Hospital Stay: Payer: BC Managed Care – PPO | Attending: Hematology and Oncology | Admitting: Emergency Medicine

## 2021-09-08 DIAGNOSIS — Z170 Estrogen receptor positive status [ER+]: Secondary | ICD-10-CM

## 2021-09-08 DIAGNOSIS — C50512 Malignant neoplasm of lower-outer quadrant of left female breast: Secondary | ICD-10-CM

## 2021-09-08 NOTE — Research (Signed)
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  Patient will continue with enrollment.  Menopausal status (women only): [name] is status post hysterectomy in 2005.  Patient was registered to the study and was randomized to receive study drug twice daily.  Baseline Visit:  The patient presents to the clinic for scheduled baseline research visit for this study.    The patient denies taking gabapentin, opioid pain medications, duloxetine, or pregabalin in the past week.  Patient denies use of cannabis and agrees to not use cannabis products during the study.  She is able to swallow medications and denies taking PEA in the past.  Patient rates her neuropathy symptoms as a 10 on a scale of 0-10 in the past week.  Baseline PROs:  The patient completed baseline questionnaire at home and brought it in to the clinic today.  The questionnaire was checked for completeness by this clinical research coordinator.  Study Drug: Study drug was dispensed to this Research officer, political party by pharmacist, Raul Del.  The patient was given study medication for the entirety of the study.  4 bottles of 30 pills each were given to the patient.  Patient was instructed to take medication twice daily with food.  Patient plans to start taking the medication tomorrow.  Weekly PROs:  Weekly questionnaires were given to the patient and she was instructed to fill out at the end of each week and mail back in provided postage paid envelopes.    Plan:  Patient plans to start study drug tomorrow.  Patient is aware to expect a phone call each week of the study with first to be expected next Wednesday.     Patient denies further questions at this time and is aware to call with any questions or concerns.    The patient was thanked for her time and participation in this study.  Clabe Seal Clinical Research Coordinator I  09/08/21 1:50 PM

## 2021-09-08 NOTE — Research (Signed)
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  Patient may continue with enrollment.  Foye Spurling, BSN, RN Clinical Research Nurse 09/08/2021

## 2021-09-10 ENCOUNTER — Other Ambulatory Visit: Payer: Self-pay | Admitting: Family Medicine

## 2021-09-14 ENCOUNTER — Telehealth: Payer: BC Managed Care – PPO | Admitting: Emergency Medicine

## 2021-09-14 DIAGNOSIS — R059 Cough, unspecified: Secondary | ICD-10-CM | POA: Diagnosis not present

## 2021-09-14 MED ORDER — TIOTROPIUM BROMIDE MONOHYDRATE 1.25 MCG/ACT IN AERS: 2.0000 | INHALATION_SPRAY | Freq: Every day | RESPIRATORY_TRACT | 0 refills | Status: DC

## 2021-09-14 NOTE — Progress Notes (Signed)
Virtual Visit Consent   [name], you are scheduled for a virtual visit with a [facility] provider today.     Just as with appointments in the office, your consent must be obtained to participate.  Your consent will be active for this visit and any virtual visit you may have with one of our providers in the next 365 days.     If you have a MyChart account, a copy of this consent can be sent to you electronically.  All virtual visits are billed to your insurance company just like a traditional visit in the office.    As this is a virtual visit, video technology does not allow for your provider to perform a traditional examination.  This may limit your provider's ability to fully assess your condition.  If your provider identifies any concerns that need to be evaluated in person or the need to arrange testing (such as labs, EKG, etc.), we will make arrangements to do so.     Although advances in technology are sophisticated, we cannot ensure that it will always work on either your end or our end.  If the connection with a video visit is poor, the visit may have to be switched to a telephone visit.  With either a video or telephone visit, we are not always able to ensure that we have a secure connection.     I need to obtain your verbal consent now.   Are you willing to proceed with your visit today?    [name] has provided verbal consent on 09/14/2021 for a virtual visit (video or telephone).   Carvel Getting, NP   Date: 09/14/2021 8:13 PM   Virtual Visit via Video Note   I, Carvel Getting, connected with  [name]  ([id number], 1961-07-07) on 09/14/21 at  7:30 PM EDT by a video-enabled telemedicine application and verified that I am speaking with the correct person using two identifiers.  Location: Patient: Virtual Visit Location Patient: Home Provider: Virtual Visit Location Provider: Home Office   I discussed the limitations of evaluation and management by telemedicine  and the availability of in person appointments. The patient expressed understanding and agreed to proceed.    History of Present Illness: [name] is a 60 y.o. who identifies as a female who was assigned female at birth, and is being seen today for chronic dry cough after COVID.  Patient had a mild COVID infection in July 2022.  Since then she has had a chronic, dry cough.  Patient does not feel ill at all.  She denies a productive cough, nasal congestion, sputum production, fever.  She does not feel fatigued.  No shortness of breath. Has returned to her usual life activities with no post-COVID problems except a dry cough.  She is using cough drops with no relief from her cough.  She tells me she is participating in a clinical trial for neuropathy and is taking a medication called palmitoylethanolamide.    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/14/2012   OBSTRUCTIVE SLEEP APNEA 02/09/2011   Breast cancer of lower-outer quadrant of left female breast ([place]) 01/14/2011   Vitamin D deficiency 12/30/2010   Hyperlipidemia 08/13/2009   Anxiety and depression 08/13/2009   HIP PAIN, RIGHT 06/29/2009  KNEE PAIN, RIGHT 06/29/2009   INSOMNIA [id number]   METABOLIC SYNDROME X [id number]   Morbid obesity ([place]) 07/22/2007   Essential hypertension 07/22/2007    Allergies: No Known Allergies Medications:  Current Outpatient Medications:    Tiotropium Bromide Monohydrate 1.25 MCG/ACT AERS, Inhale 2 puffs into the lungs daily., Disp: 4 g, Rfl: 0   anastrozole (ARIMIDEX) 1 MG tablet, TAKE 1 TABLET BY MOUTH EVERY DAY, Disp: 90 tablet, Rfl: 3   atenolol-chlorthalidone (TENORETIC) 50-25 MG tablet, TAKE 1/2 TABLET BY MOUTH EVERY DAY, Disp: 45 tablet, Rfl: 1   atorvastatin (LIPITOR) 20 MG tablet, TAKE 1 TABLET BY MOUTH  EVERY DAY, Disp: 90 tablet, Rfl: 1   diclofenac Sodium (VOLTAREN) 1 % GEL, Apply 2 g topically 4 (four) times daily., Disp: 100 g, Rfl: 1   furosemide (LASIX) 40 MG tablet, Take 1 tablet (40 mg total) by mouth daily., Disp: 90 tablet, Rfl: 0   gabapentin (NEURONTIN) 400 MG capsule, Take 1 capsule (400 mg total) by mouth 3 (three) times daily. (Patient not taking: Reported on 09/03/2021), Disp: 270 capsule, Rfl: 3   Semaglutide,0.25 or 0.'5MG'$ /DOS, (OZEMPIC, 0.25 OR 0.5 MG/DOSE,) 2 MG/1.5ML SOPN, Inject 0.5 mg into the skin once a week., Disp: 6 mL, Rfl: 0   zolpidem (AMBIEN) 10 MG tablet, TAKE 1 TABLET BY MOUTH AT BEDTIME AS NEEDED., Disp: 30 tablet, Rfl: 3  Observations/Objective: Patient is well-developed, well-nourished in no acute distress.  Resting comfortably  at home.  Head is normocephalic, atraumatic.  No labored breathing.  Speech is clear and coherent with logical content.  Patient is alert and oriented at baseline.    Assessment and Plan: 1. Cough  Looking literature, it appears cough post COVID can occur up to 6 months after infection and occurs in approximately 17 to 34% of people post-COVID.  Up-to-date suggests usual postviral cough treatments such as dextromethorphan, which I have recommended to the patient.  There appears to be some limited preliminary evidence that antimuscarinics such as tiotropium can help relieve cough.  I explained to patient the data for using Tiotropium for this problem is scant.  Patient expresses an interest in trying it.  Encourage patient to follow-up with her PCP if she does develop any other post-COVID symptoms such as dyspnea or fatigue.  Encourage patient to follow-up with her PCP if this plan of care does not improve her cough.  Follow Up Instructions: I discussed the assessment and treatment plan with the patient. The patient was provided an opportunity to ask questions and all were answered. The patient agreed with the plan and demonstrated an  understanding of the instructions.  A copy of instructions were sent to the patient via MyChart.  The patient was advised to call back or seek an in-person evaluation if the symptoms worsen or if the condition fails to improve as anticipated.  Time:  I spent 15 minutes with the patient via telehealth technology discussing the above problems/concerns.    Carvel Getting, NP

## 2021-09-14 NOTE — Patient Instructions (Signed)
[name], thank you for joining Carvel Getting, NP for today's virtual visit.  While this provider is not your primary care provider (PCP), if your PCP is located in our provider database this encounter information will be shared with them immediately following your visit.  Consent: (Patient) [name] provided verbal consent for this virtual visit at the beginning of the encounter.  Current Medications:  Current Outpatient Medications:    Tiotropium Bromide Monohydrate 1.25 MCG/ACT AERS, Inhale 2 puffs into the lungs daily., Disp: 4 g, Rfl: 0   anastrozole (ARIMIDEX) 1 MG tablet, TAKE 1 TABLET BY MOUTH EVERY DAY, Disp: 90 tablet, Rfl: 3   atenolol-chlorthalidone (TENORETIC) 50-25 MG tablet, TAKE 1/2 TABLET BY MOUTH EVERY DAY, Disp: 45 tablet, Rfl: 1   atorvastatin (LIPITOR) 20 MG tablet, TAKE 1 TABLET BY MOUTH EVERY DAY, Disp: 90 tablet, Rfl: 1   diclofenac Sodium (VOLTAREN) 1 % GEL, Apply 2 g topically 4 (four) times daily., Disp: 100 g, Rfl: 1   furosemide (LASIX) 40 MG tablet, Take 1 tablet (40 mg total) by mouth daily., Disp: 90 tablet, Rfl: 0   gabapentin (NEURONTIN) 400 MG capsule, Take 1 capsule (400 mg total) by mouth 3 (three) times daily. (Patient not taking: Reported on 09/03/2021), Disp: 270 capsule, Rfl: 3   Semaglutide,0.25 or 0.'5MG'$ /DOS, (OZEMPIC, 0.25 OR 0.5 MG/DOSE,) 2 MG/1.5ML SOPN, Inject 0.5 mg into the skin once a week., Disp: 6 mL, Rfl: 0   zolpidem (AMBIEN) 10 MG tablet, TAKE 1 TABLET BY MOUTH AT BEDTIME AS NEEDED., Disp: 30 tablet, Rfl: 3   Medications ordered in this encounter:  Meds ordered this encounter  Medications   Tiotropium Bromide Monohydrate 1.25 MCG/ACT AERS    Sig: Inhale 2 puffs into the lungs daily.    Dispense:  4 g    Refill:  0     *If you need refills on other medications prior to your next appointment, please contact your pharmacy*  Follow-Up: Call back or seek an in-person evaluation if the symptoms worsen or if the condition fails  to improve as anticipated.  Other Instructions Try extended release dextromethorphan cough syrup (plain Delsym) as directed on the bottle for your cough.  I have also prescribed a tiotropium inhaler to use once a day to see if that helps relieve your chronic dry cough after COVID.  I would use the inhaler for at least a couple of weeks to see if it makes a difference before stopping using.  Post-COVID cough can last for several months.  If you do not feel like the dextromethorphan and/or tiotropium inhaler are helping relieve your cough, follow-up with your primary care provider.  If you begin having shortness of breath with exertion, please follow-up with your primary care provider.  If you have been instructed to have an in-person evaluation today at a local Urgent Care facility, please use the link below. It will take you to a list of all of our available [facility], including address, phone number and hours of operation. Please do not delay care.  [facility]  If you or a family member do not have a primary care provider, use the link below to schedule a visit and establish care. When you choose a [facility] primary care physician or advanced practice provider, you gain a long-term partner in health. Find a Primary Care Provider  Learn more about [facility]'s in-office and virtual care options: [facility]

## 2021-09-15 NOTE — Progress Notes (Signed)
Error. Pt needs virtual visit or in-person visit.

## 2021-09-17 ENCOUNTER — Encounter: Payer: Self-pay | Admitting: Emergency Medicine

## 2021-09-17 DIAGNOSIS — C50512 Malignant neoplasm of lower-outer quadrant of left female breast: Secondary | ICD-10-CM

## 2021-09-17 NOTE — Research (Signed)
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  Left voicemail requesting return call.  Clabe Seal Clinical Research Coordinator I  09/17/21 4:22 PM

## 2021-09-18 ENCOUNTER — Encounter: Payer: Self-pay | Admitting: Emergency Medicine

## 2021-09-18 DIAGNOSIS — Z170 Estrogen receptor positive status [ER+]: Secondary | ICD-10-CM

## 2021-09-18 DIAGNOSIS — C50512 Malignant neoplasm of lower-outer quadrant of left female breast: Secondary | ICD-10-CM

## 2021-09-18 NOTE — Research (Addendum)
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 Identity was confirmed using two patient identifiers.    Neuropathy:  The patient states she has noticed subjective improvement in her neuropathy symptoms since starting the study medication.  Questionnaires:  Patient states she has completed their weekly questionnaire and has mailed the completed form using the provided envelope.   Investigational Product:  Patient reports taking the medication twice daily with food and not missing any doses since starting the medication on 09/09/21. She took a total of 14 doses during her first week of treatment and is always compliant with study medication. Patient was re-educated on the importance of taking study medication as directed.  Patient verbalized understanding.  She denies taking CBD, TCH or other cannabis products and agrees to not do so while taking study medication.  Side Effects/Adverse Events:  Patient reports nausea. Patient states nausea since starting Ozempic in  July 2022. The patient reports missing her dose of ozempic last week but plans to resume using this medication as prescribed.  She reports noticing increased nausea the day after starting this study's medication. She reports having a reduced appetite, but has not missed any meals and reports that she is still able to eat full meals.  She states eating snacks between meals helps the nausea.  Discussed with Dr. Lindi Adie on 09/18/21 who assigned attribution of "possible".  She denies vomiting or diarrhea.    Patient also reports a chronic cough which she has had since testing positive for COVID in mid July 2022.  She had a virtual visit this past weekend for this cough and was prescribed a  Spiriva inhaler and recommended to take Delsym.  Patient reports this cough was present prior to enrolling in this study and has not changed in the time since starting this study.   Event Grade Onset Date Resolved Date Attribution Treatment Comments  Nausea 27 June 2021 Ongoing Possible none Started after initiating ozempic, worsened after starting study drug     Plan: Patient denies questions or concerns at ths time.  Next weekly phone call is planned for next Wednesday, 09/24/2021.  The patient was thanked for their time and continued voluntary participation in this study. Patient has been provided direct contact information and is encouraged to contact this coordinator for any needs or questions.   [place] Coordinator I  09/18/21 9:58 AM   Addendum 09/19/21 Grade for Nausea adverse event was initially erroneously entered at 2.  Corrected to grade 1.  Clabe Seal Clinical Research Coordinator I  09/19/21 9:34 AM

## 2021-09-24 DIAGNOSIS — C50512 Malignant neoplasm of lower-outer quadrant of left female breast: Secondary | ICD-10-CM

## 2021-09-24 NOTE — Research (Signed)
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 Identity was confirmed using two patient identifiers, name and date of birth. This research nurse identified / introduced herself to the patient in the absence of Clabe Seal, [facility] , her Research officer, political party. Research nurse explained to the patient that Baron Sane was currently on vacation and I am filling in for him just today.    Neuropathy:  The patient states she has noticed dramatic improvement in her neuropathy symptoms since starting the study medication. She reports only very mild pain at night for which she takes one Tylenol when needed.   Questionnaires:  Patient states she has completed their weekly questionnaire and will mail the completed forms in the morning using the provided envelope.   Investigational Product:  Patient reports taking the medication twice daily with food and not missing any doses since starting the medication on 09/09/21. She took a total of 14 doses of IP treatment this week and is always compliant with study medication. Patient was re-educated on the importance of taking study medication as directed.  Patient verbalized understanding.  She denies taking CBD, TCH or other cannabis products and agrees to not do so while taking study medication.  Side Effects/Adverse Events:  Patient reports nausea has resolved completely. Patient denies have any untoward side effects this past week at all.   She denies vomiting or diarrhea.   Patient states she is sleeping at least 4-5 hours at night without waking up now, and her quality of life is so much better. She states she's going to the mall, and can walk the entire distance without pain at all. She states she wants to continue on this  medication even after the clinical trial is completed.       Event Grade Onset Date Resolved Date Attribution Treatment Comments  Nausea 27 June 2021 09/24/2021 Possible none Started after initiating ozempic, worsened after starting study drug     Plan: Patient denies questions or concerns at ths time.  Next weekly phone call is planned for next Wednesday, 10/01/2021.  The patient was thanked for their time and continued voluntary participation in this study. The patient understands that Baron Sane will continue calling her for her follow ups next Wednesday when he returns. Patient has been provided direct contact information and is encouraged to contact the research department for any needs or questions she may have.   Jeral Fruit, RN 09/24/21 2:51 PM

## 2021-10-01 ENCOUNTER — Encounter: Payer: Self-pay | Admitting: Emergency Medicine

## 2021-10-01 DIAGNOSIS — Z170 Estrogen receptor positive status [ER+]: Secondary | ICD-10-CM

## 2021-10-01 DIAGNOSIS — C50512 Malignant neoplasm of lower-outer quadrant of left female breast: Secondary | ICD-10-CM

## 2021-10-01 NOTE — Research (Signed)
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 Identity was confirmed using two patient identifiers, name and date of birth.   Neuropathy:  The patient states she has noticed dramatic improvement in her neuropathy symptoms since starting the study medication. She reports there is still some burning in her toes for which she takes Tylenol as needed.  She reports that she is no longer having cramping in her legs.  Questionnaires:  Patient states she has completed the week 3 questionnaire yesterday, 09/30/21, and will mail the completed forms in the morning using the provided envelope. Week 2 questionnaires were received by this department yesterday, 09/30/21.  Investigational Product:  Patient reports taking the medication twice daily with food and not missing any doses since starting the medication on 09/09/21. She took a total of 14 doses of IP treatment this week and is always compliant with study medication. Patient was re-educated on the importance of taking study medication as directed.  Patient verbalized understanding.  She denies taking CBD, TCH or other cannabis products and agrees to not do so while taking study medication.  Side Effects/Adverse Events:  She denies nausea, vomiting, or diarrhea in the past week.  She also denies any other side effects in the past week.    Event Grade Onset Date Resolved Date Attribution Treatment Comments  Nausea 27 June 2021 09/24/2021 Possible none Started after initiating ozempic, worsened after starting study drug     Plan: Patient denies questions or concerns at ths time.  Next weekly phone call is planned for next Wednesday, 10/08/2021.  The patient was thanked for their time and continued  voluntary participation in this study. Patient has been provided direct contact information and is encouraged to contact the research department for any needs or questions she may have.   Clabe Seal Clinical Research Coordinator I  10/01/21 10:08 AM

## 2021-10-03 ENCOUNTER — Other Ambulatory Visit: Payer: Self-pay | Admitting: Family Medicine

## 2021-10-06 ENCOUNTER — Other Ambulatory Visit: Payer: Self-pay | Admitting: Family Medicine

## 2021-10-06 DIAGNOSIS — G4700 Insomnia, unspecified: Secondary | ICD-10-CM

## 2021-10-08 ENCOUNTER — Encounter: Payer: Self-pay | Admitting: Emergency Medicine

## 2021-10-08 ENCOUNTER — Telehealth: Payer: Self-pay | Admitting: Emergency Medicine

## 2021-10-08 DIAGNOSIS — C50512 Malignant neoplasm of lower-outer quadrant of left female breast: Secondary | ICD-10-CM

## 2021-10-08 NOTE — Telephone Encounter (Signed)
See separate research encounter for week 4 phone call documentation.

## 2021-10-08 NOTE — Research (Signed)
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 Identity was confirmed using two patient identifiers, name and date of birth.   Neuropathy:  The patient states she has noticed worsening in burning and tingling neuropathy symptoms in the past week.  She has been taking tylenol and aleve as needed.    Questionnaires:  Patient states she has completed the week 4 questionnaire yesterday, 10/07/21, and will mail the completed forms today using the provided envelope. Week 3 questionnaires were received by this department on 10/06/2021.  Investigational Product:  Patient reports taking the medication twice daily with food and not missing any doses since starting the medication on 09/09/21. She took a total of 14 doses of IP treatment this week and is always compliant with study medication. Patient was re-educated on the importance of taking study medication as directed.  Patient verbalized understanding.  She denies taking CBD, TCH or other cannabis products and agrees to not do so while taking study medication.  Side Effects/Adverse Events:  Patient reports some mild nausea has returned this week.  She states this is manageable and not affecting eating habits.  She typically notices it mostly in the morning.  She reports the nausea improves with eating.  She denies vomiting, or diarrhea in the past week.  She also denies any other side effects in the past week.  The patient was discussed with Dr. Lindi Adie and attribution was obtained 10/08/2021.  Event Grade Onset Date Resolved Date Attribution Treatment Comments  Nausea 27 June 2021 09/24/2021 Possible none Started after initiating ozempic, worsened after starting study drug  Nausea 1 10/02/2021 Ongoing  Unlikely none     Plan: Patient denies questions or concerns at ths time.  Next weekly phone call is planned for next Tuesday, 10/14/2021.  The patient was thanked for their time and continued voluntary participation in this study. Patient has been provided direct contact information and is encouraged to contact the research department for any needs or questions she may have.    Clabe Seal Clinical Research Coordinator I  10/08/21 12:07 PM

## 2021-10-09 ENCOUNTER — Other Ambulatory Visit: Payer: Self-pay | Admitting: Family Medicine

## 2021-10-09 DIAGNOSIS — G4700 Insomnia, unspecified: Secondary | ICD-10-CM

## 2021-10-12 ENCOUNTER — Other Ambulatory Visit: Payer: Self-pay | Admitting: Family Medicine

## 2021-10-12 DIAGNOSIS — G4700 Insomnia, unspecified: Secondary | ICD-10-CM

## 2021-10-13 NOTE — Telephone Encounter (Signed)
Can you please escribe? UTD on visits

## 2021-10-14 ENCOUNTER — Encounter: Payer: Self-pay | Admitting: Emergency Medicine

## 2021-10-14 DIAGNOSIS — C50512 Malignant neoplasm of lower-outer quadrant of left female breast: Secondary | ICD-10-CM

## 2021-10-14 DIAGNOSIS — Z170 Estrogen receptor positive status [ER+]: Secondary | ICD-10-CM

## 2021-10-14 NOTE — Research (Signed)
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 Identity was confirmed using two patient identifiers, name and date of birth.   Neuropathy:  The patient states her neuropathy symptoms are stable since last week.  She does notice increased burning sensation in her feet at night.  She takes Aleve or Tylenol as needed for this which she states helps.  She reports that these symptoms are improved from   Questionnaires:  Patient states she will complete the week 5 questionnaire today and mail it tomorrow using the provided envelope.   Investigational Product:  Patient reports taking the medication twice daily with food and not missing any doses since starting the medication on 09/09/21. She took a total of 14 doses of IP treatment this week and is always compliant with study medication. Patient was re-educated on the importance of taking study medication as directed.  Patient verbalized understanding.  She denies taking CBD, TCH or other cannabis products and agrees to not do so while taking study medication.  Side Effects/Adverse Events:  Patient has not noticed any nausea in the past week.  She states her nausea has been occurring intermittently over the course of the trial.  She denies vomiting, or diarrhea in the past week.  She also denies any other side effects in the past week.   Event Grade Onset Date Resolved Date Attribution Treatment Comments  Nausea 27 June 2021 09/24/2021 Possible none Started after initiating ozempic, worsened after starting study drug  Nausea 1 10/02/2021 Ongoing Unlikely none Intermittent    Plan: Patient denies questions or concerns at ths time.  The next weekly phone call is planned for next Wednesday,  10/22/2021.  The patient was thanked for their time and continued voluntary participation in this study. Patient has been provided direct contact information and is encouraged to contact the research department for any needs or questions she may have.    Clabe Seal Clinical Research Coordinator I  10/14/21 11:04 AM

## 2021-10-15 ENCOUNTER — Other Ambulatory Visit: Payer: Self-pay | Admitting: Family Medicine

## 2021-10-15 DIAGNOSIS — G4700 Insomnia, unspecified: Secondary | ICD-10-CM

## 2021-10-22 ENCOUNTER — Encounter: Payer: Self-pay | Admitting: Emergency Medicine

## 2021-10-22 DIAGNOSIS — C50512 Malignant neoplasm of lower-outer quadrant of left female breast: Secondary | ICD-10-CM

## 2021-10-22 NOTE — Research (Signed)
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 Identity was confirmed using two patient identifiers, name and date of birth.   Neuropathy:  The patient states her neuropathy symptoms are stable since last week.  She notes she has had some tingling sensation in bilateral hands this week.  Questionnaires:  Patient states she will complete the week 6 questionnaire today and mail it today using the provided envelope.   On the week 4 questionnaire, the patient had circled two answers for question #36 on the [employer] [id number].  The patient was asked the question today over the phone and she reports an answer of "quite a bit" for that time period.  The questionnaire was corrected with this answer.  Investigational Product:  Patient reports taking the medication twice daily with food and not missing any doses since starting the medication on 09/09/21. She took a total of 14 doses of IP treatment this week and is always compliant with study medication. Patient was re-educated on the importance of taking study medication as directed.  Patient verbalized understanding.  She denies taking CBD, TCH or other cannabis products and agrees to not do so while taking study medication.  Side Effects/Adverse Events:  Patient does not report any nausea, vomiting, or diarrhea in the past week. She also denies any other side effects in the past week.   Event Grade Onset Date Resolved Date Attribution Treatment Comments  Nausea 27 June 2021 09/24/2021 Possible none Started after initiating ozempic, worsened after starting study drug  Nausea 1 10/02/2021 Ongoing Unlikely none Intermittent    Plan: Patient denies questions or concerns at ths time.  The next weekly phone  call is planned for next Wednesday, 10/29/2021.  The patient was thanked for their time and continued voluntary participation in this study. Patient has been provided direct contact information and is encouraged to contact the research department for any needs or questions she may have.    Clabe Seal Clinical Research Coordinator I  10/22/21 11:44 AM

## 2021-10-29 ENCOUNTER — Encounter: Payer: Self-pay | Admitting: Emergency Medicine

## 2021-10-29 DIAGNOSIS — Z170 Estrogen receptor positive status [ER+]: Secondary | ICD-10-CM

## 2021-10-29 DIAGNOSIS — C50512 Malignant neoplasm of lower-outer quadrant of left female breast: Secondary | ICD-10-CM

## 2021-10-29 NOTE — Research (Signed)
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  Patient did not answer, left voicemail requesting return call.  3:20pm: Called again, no answer, left voicemail.  Clabe Seal Clinical Research Coordinator I  10/29/21 4:58 PM

## 2021-10-30 ENCOUNTER — Encounter: Payer: Self-pay | Admitting: Emergency Medicine

## 2021-10-30 DIAGNOSIS — C50512 Malignant neoplasm of lower-outer quadrant of left female breast: Secondary | ICD-10-CM

## 2021-10-30 NOTE — Research (Signed)
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 Identity was confirmed using two patient identifiers, name and date of birth.   Neuropathy:  The patient states her neuropathy symptoms are stable since last week.  Questionnaires:  Patient states she has completed the week 7 questionnaire and sent it in the mail yesterday.  Investigational Product:  Patient reports taking the medication twice daily with food and not missing any doses since starting the medication on 09/09/21. She took a total of 14 doses of IP treatment this week and is always compliant with study medication. Patient was re-educated on the importance of taking study medication as directed.  Patient verbalized understanding.  She denies taking CBD, TCH or other cannabis products and agrees to not do so while taking study medication.  Side Effects/Adverse Events:  Patient does not report any nausea, vomiting, or diarrhea in the past week. She also denies any other side effects in the past week.   Event Grade Onset Date Resolved Date Attribution Treatment Comments  Nausea 27 June 2021 09/24/2021 Possible none Started after initiating ozempic, worsened after starting study drug  Nausea 1 10/02/2021 Ongoing Unlikely none Intermittent    Plan: Patient denies questions or concerns at ths time.  The final weekly phone call is planned for next Wednesday, 11/05/2021.  The patient was thanked for their time and continued voluntary participation in this study. Patient has been provided direct contact information and is encouraged to contact the research department for any needs or questions she may have.    Clabe Seal Clinical Research Coordinator I  10/30/21 10:53 AM

## 2021-11-01 ENCOUNTER — Other Ambulatory Visit: Payer: Self-pay | Admitting: Family Medicine

## 2021-11-05 ENCOUNTER — Encounter: Payer: Self-pay | Admitting: Emergency Medicine

## 2021-11-05 DIAGNOSIS — C50512 Malignant neoplasm of lower-outer quadrant of left female breast: Secondary | ICD-10-CM

## 2021-11-05 DIAGNOSIS — Z170 Estrogen receptor positive status [ER+]: Secondary | ICD-10-CM

## 2021-11-05 NOTE — Research (Signed)
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 Identity was confirmed using two patient identifiers, name and date of birth.   Neuropathy:  The patient states her neuropathy symptoms are stable since last week.  Questionnaires:  Patient states will complete the week 8 questionnaire today.    Investigational Product:  Patient reports taking the medication twice daily with food and not missing any doses since starting the medication on 09/09/21. She took a total of 14 doses of IP treatment this week and is always compliant with study medication.  The patient has completed week 8 of the study as of yesterday, and therefore has stopped taking the study medication.  She denies taking CBD, TCH or other cannabis products and agrees to not do so while taking study medication.  Side Effects/Adverse Events:  Patient does not report any nausea, vomiting, or diarrhea in the past week. She also denies any other side effects in the past week.  She states her nausea has resolved.   Event Grade Onset Date Resolved Date Attribution Treatment Comments  Nausea 27 June 2021 09/24/2021 Possible none Started after initiating ozempic, worsened after starting study drug  Nausea 1 10/02/2021 11/05/2021 Unlikely none Intermittent    Plan:  Scheduled for patient to drop off leftover study medication and week 8 PROs tomorrow at 1:00pm.    The patient states the study medication helped her and would like to be unblinded.  She states she is interested in purchasing and taking PEA on her own.  She notes that her neuropathy symptoms had not completely resolved even on the study medication and is asking if she can restart taking gabapentin.  The patient was thanked for their time and  continued voluntary participation in this study. Patient has been provided direct contact information and is encouraged to contact the research department for any needs or questions she may have.    Clabe Seal Clinical Research Coordinator I  11/05/21 1:47 PM

## 2021-11-06 ENCOUNTER — Encounter: Payer: Self-pay | Admitting: Emergency Medicine

## 2021-11-06 DIAGNOSIS — C50512 Malignant neoplasm of lower-outer quadrant of left female breast: Secondary | ICD-10-CM

## 2021-11-06 NOTE — Research (Signed)
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  The patient provided one bottle containing 6 capsules.  The patient states the other 3 bottles at home were empty.  Patient took study medication last on Tuesday, 11/04/2021, both in the morning and evening.  Disposal of Study Drug:  The 6 capsules of study drug were provided to pharmacist [facility] for destruction.    Plan:  The patient does wish to be unblinded.  Dr. Lindi Adie agrees to unblind the patient.  Will request the study sponsor to unblind the patient.     Clabe Seal Clinical Research Coordinator I  11/06/21 3:16 PM

## 2021-11-13 ENCOUNTER — Other Ambulatory Visit: Payer: Self-pay | Admitting: Family Medicine

## 2021-11-13 ENCOUNTER — Telehealth: Payer: Self-pay | Admitting: Emergency Medicine

## 2021-11-13 NOTE — Telephone Encounter (Signed)
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    The patient was called and informed that she was receiving investigational agent during the study.  She was informed that the study used micronized PEA.    The patient states she has resumed use of gabapentin since stopping study medication due to return of neuropathy symptoms.  The patient denied further questions at this time.  Clabe Seal Clinical Research Coordinator I  11/13/21  9:38 AM

## 2021-12-15 ENCOUNTER — Other Ambulatory Visit: Payer: Self-pay | Admitting: Hematology and Oncology

## 2021-12-15 DIAGNOSIS — Z1231 Encounter for screening mammogram for malignant neoplasm of breast: Secondary | ICD-10-CM

## 2021-12-30 ENCOUNTER — Ambulatory Visit: Payer: BC Managed Care – PPO | Admitting: Family Medicine

## 2021-12-30 ENCOUNTER — Encounter: Payer: Self-pay | Admitting: Family Medicine

## 2021-12-30 VITALS — BP 120/60 | HR 84 | Temp 97.7°F | Resp 16 | Wt 211.4 lb

## 2021-12-30 DIAGNOSIS — L659 Nonscarring hair loss, unspecified: Secondary | ICD-10-CM | POA: Insufficient documentation

## 2021-12-30 DIAGNOSIS — I10 Essential (primary) hypertension: Secondary | ICD-10-CM | POA: Diagnosis not present

## 2021-12-30 DIAGNOSIS — E782 Mixed hyperlipidemia: Secondary | ICD-10-CM | POA: Diagnosis not present

## 2021-12-30 DIAGNOSIS — D2230 Melanocytic nevi of unspecified part of face: Secondary | ICD-10-CM | POA: Insufficient documentation

## 2021-12-30 LAB — CBC WITH DIFFERENTIAL/PLATELET
Basophils Absolute: 0 10*3/uL (ref 0.0–0.1)
Basophils Relative: 0.6 % (ref 0.0–3.0)
Eosinophils Absolute: 0.1 10*3/uL (ref 0.0–0.7)
Eosinophils Relative: 1.1 % (ref 0.0–5.0)
HCT: 39.2 % (ref 36.0–46.0)
Hemoglobin: 13.1 g/dL (ref 12.0–15.0)
Lymphocytes Relative: 24.7 % (ref 12.0–46.0)
Lymphs Abs: 1.2 10*3/uL (ref 0.7–4.0)
MCHC: 33.3 g/dL (ref 30.0–36.0)
MCV: 93.8 fl (ref 78.0–100.0)
Monocytes Absolute: 0.5 10*3/uL (ref 0.1–1.0)
Monocytes Relative: 9.6 % (ref 3.0–12.0)
Neutro Abs: 3.1 10*3/uL (ref 1.4–7.7)
Neutrophils Relative %: 64 % (ref 43.0–77.0)
Platelets: 324 10*3/uL (ref 150.0–400.0)
RBC: 4.18 Mil/uL (ref 3.87–5.11)
RDW: 13.1 % (ref 11.5–15.5)
WBC: 4.8 10*3/uL (ref 4.0–10.5)

## 2021-12-30 LAB — BASIC METABOLIC PANEL
BUN: 15 mg/dL (ref 6–23)
CO2: 31 mEq/L (ref 19–32)
Calcium: 9.5 mg/dL (ref 8.4–10.5)
Chloride: 99 mEq/L (ref 96–112)
Creatinine, Ser: 0.95 mg/dL (ref 0.40–1.20)
GFR: 65.57 mL/min (ref >60.00)
Glucose, Bld: 85 mg/dL (ref 70–99)
Potassium: 3.7 mEq/L (ref 3.5–5.1)
Sodium: 139 mEq/L (ref 135–145)

## 2021-12-30 LAB — HEPATIC FUNCTION PANEL
ALT: 14 U/L (ref 0–35)
AST: 13 U/L (ref 0–37)
Albumin: 4.1 g/dL (ref 3.5–5.2)
Alkaline Phosphatase: 99 U/L (ref 39–117)
Bilirubin, Direct: 0.1 mg/dL (ref 0.0–0.3)
Total Bilirubin: 0.3 mg/dL (ref 0.2–1.2)
Total Protein: 7.4 g/dL (ref 6.0–8.3)

## 2021-12-30 LAB — LIPID PANEL
Cholesterol: 179 mg/dL (ref 0–200)
HDL: 43.2 mg/dL (ref >39.00)
LDL Cholesterol: 113 mg/dL — ABNORMAL HIGH (ref 0–99)
NonHDL: 135.91
Total CHOL/HDL Ratio: 4
Triglycerides: 116 mg/dL (ref 0.0–149.0)
VLDL: 23.2 mg/dL (ref 0.0–40.0)

## 2021-12-30 LAB — TSH: TSH: 1.15 u[IU]/mL (ref 0.35–5.50)

## 2021-12-30 MED ORDER — OZEMPIC (0.25 OR 0.5 MG/DOSE) 2 MG/1.5ML ~~LOC~~ SOPN: 0.5000 mg | PEN_INJECTOR | SUBCUTANEOUS | 3 refills | Status: DC

## 2021-12-30 NOTE — Patient Instructions (Signed)
Schedule your complete physical in 6 months We'll notify you of your lab results and make any changes if needed INCREASE the Ozempic to 0.5mg  weekly Continue to work on healthy diet and regular exercise- you're doing great! Call with any questions or concerns Stay Safe!  Stay Healthy! Happy New Year!!!

## 2021-12-30 NOTE — Progress Notes (Signed)
° °  Subjective:    Patient ID: [name], female    DOB: 01/17/1961, 61 y.o.   MRN: [id number]  HPI HTN- chronic problem, on Atenolol-Chlorthalidone 50/25mg  daily, Lasix 40mg  daily w/ good control.  No CP, SOB, HAs, visual changes, edema.  Hyperlipidemia- chronic problem, on Lipitor 20mg  daily.  No abd pain, N/V.  Obesity- Pt is down 10 lbs since last visit.  Currently on Ozempic.  Tolerating w/o excessive nausea.  Would like to increase dose.   Review of Systems For ROS see HPI   This visit occurred during the SARS-CoV-2 public health emergency.  Safety protocols were in place, including screening questions prior to the visit, additional usage of staff PPE, and extensive cleaning of exam room while observing appropriate contact time as indicated for disinfecting solutions.      Objective:   Physical Exam Vitals reviewed.  Constitutional:      General: She is not in acute distress.    Appearance: Normal appearance. She is well-developed. She is not ill-appearing.  HENT:     Head: Normocephalic and atraumatic.  Eyes:     Conjunctiva/sclera: Conjunctivae normal.     Pupils: Pupils are equal, round, and reactive to light.  Neck:     Thyroid: No thyromegaly.  Cardiovascular:     Rate and Rhythm: Normal rate and regular rhythm.     Heart sounds: Normal heart sounds. No murmur heard. Pulmonary:     Effort: Pulmonary effort is normal. No respiratory distress.     Breath sounds: Normal breath sounds.  Abdominal:     General: There is no distension.     Palpations: Abdomen is soft.     Tenderness: There is no abdominal tenderness.  Musculoskeletal:     Cervical back: Normal range of motion and neck supple.     Right lower leg: No edema.     Left lower leg: No edema.  Lymphadenopathy:     Cervical: No cervical adenopathy.  Skin:    General: Skin is warm and dry.  Neurological:     Mental Status: She is alert and oriented to person, place, and time.  Psychiatric:         Behavior: Behavior normal.          Assessment & Plan:

## 2021-12-31 ENCOUNTER — Encounter: Payer: Self-pay | Admitting: Hematology and Oncology

## 2021-12-31 NOTE — Assessment & Plan Note (Signed)
Chronic problem.  On Lipitor 20mg daily w/o difficulty.  Check labs.  Adjust meds prn  

## 2021-12-31 NOTE — Assessment & Plan Note (Signed)
Ongoing issue for pt.  Down 10 lbs since last visit.  Currently on Ozempic.  Feels she has hit a plateau and would like to increase dose.  Will send new prescription for the 0.5mg  weekly dose.  Pt expressed understanding and is in agreement w/ plan.

## 2021-12-31 NOTE — Assessment & Plan Note (Signed)
Chronic problem.  Well controlled on Atenolol/Chlorthalidone 50/25mg  and lasix 40mg  daily.  Currently asymptomatic.  Check labs due to diuretic use.  No anticipated med changes.

## 2022-01-16 ENCOUNTER — Telehealth: Payer: BC Managed Care – PPO | Admitting: Physician Assistant

## 2022-01-16 DIAGNOSIS — H109 Unspecified conjunctivitis: Secondary | ICD-10-CM

## 2022-01-16 MED ORDER — POLYMYXIN B-TRIMETHOPRIM 10000-0.1 UNIT/ML-% OP SOLN: 1.0000 [drp] | OPHTHALMIC | 0 refills | Status: DC

## 2022-01-16 NOTE — Progress Notes (Signed)
Virtual Visit Consent   [name], you are scheduled for a virtual visit with a [facility] provider today.     Just as with appointments in the office, your consent must be obtained to participate.  Your consent will be active for this visit and any virtual visit you may have with one of our providers in the next 365 days.     If you have a MyChart account, a copy of this consent can be sent to you electronically.  All virtual visits are billed to your insurance company just like a traditional visit in the office.    As this is a virtual visit, video technology does not allow for your provider to perform a traditional examination.  This may limit your provider's ability to fully assess your condition.  If your provider identifies any concerns that need to be evaluated in person or the need to arrange testing (such as labs, EKG, etc.), we will make arrangements to do so.     Although advances in technology are sophisticated, we cannot ensure that it will always work on either your end or our end.  If the connection with a video visit is poor, the visit may have to be switched to a telephone visit.  With either a video or telephone visit, we are not always able to ensure that we have a secure connection.     I need to obtain your verbal consent now.   Are you willing to proceed with your visit today?    [name] has provided verbal consent on 01/16/2022 for a virtual visit (video or telephone).   Mar Daring, PA-C   Date: 01/16/2022 8:11 AM   Virtual Visit via Video Note   I, Mar Daring, connected with  [name]  ([id number], 1961-06-02) on 01/16/22 at  8:00 AM EST by a video-enabled telemedicine application and verified that I am speaking with the correct person using two identifiers.  Location: Patient: Virtual Visit Location Patient: Home Provider: Virtual Visit Location Provider: Home Office   I discussed the limitations of evaluation and management by  telemedicine and the availability of in person appointments. The patient expressed understanding and agreed to proceed.    History of Present Illness: [name] is a 61 y.o. who identifies as a female who was assigned female at birth, and is being seen today for possible pink eye.  HPI: Conjunctivitis  The current episode started 5 to 7 days ago. The onset was gradual. The problem occurs occasionally. The problem has been unchanged. The problem is mild. Nothing relieves the symptoms. Nothing aggravates the symptoms. Associated symptoms include eye itching, eye discharge and eye redness. Pertinent negatives include no fever, no decreased vision, no double vision, no photophobia, no headaches and no eye pain. The eye pain is mild. The right eye is affected. The eye pain is not associated with movement.   Did have a possible contact with a child with pink eye last week.  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   OBSTRUCTIVE SLEEP APNEA 02/09/2011   Breast cancer of lower-outer quadrant of left female breast ([place]) 01/14/2011   Vitamin D deficiency 12/30/2010   Hyperlipidemia 08/13/2009   Anxiety and  depression 08/13/2009   HIP PAIN, RIGHT 06/29/2009   KNEE PAIN, RIGHT 06/29/2009   INSOMNIA 77/10/6578   METABOLIC SYNDROME X 03/83/3383   Morbid obesity ([place]) 07/22/2007   Essential hypertension 07/22/2007    Allergies: No Known Allergies Medications:  Current Outpatient Medications:    trimethoprim-polymyxin b (POLYTRIM) ophthalmic solution, Place 1 drop into the right eye every 4 (four) hours. X 3-5 days, Disp: 10 mL, Rfl: 0   anastrozole (ARIMIDEX) 1 MG tablet, TAKE 1 TABLET BY MOUTH EVERY DAY, Disp: 90 tablet, Rfl: 3    atenolol-chlorthalidone (TENORETIC) 50-25 MG tablet, TAKE 1/2 TABLET BY MOUTH EVERY DAY, Disp: 45 tablet, Rfl: 1   atorvastatin (LIPITOR) 20 MG tablet, TAKE 1 TABLET BY MOUTH EVERY DAY, Disp: 90 tablet, Rfl: 1   diclofenac Sodium (VOLTAREN) 1 % GEL, Apply 2 g topically 4 (four) times daily., Disp: 100 g, Rfl: 1   furosemide (LASIX) 40 MG tablet, TAKE 1 TABLET BY MOUTH EVERY DAY, Disp: 90 tablet, Rfl: 0   Semaglutide,0.25 or 0.5MG /DOS, (OZEMPIC, 0.25 OR 0.5 MG/DOSE,) 2 MG/1.5ML SOPN, Inject 0.5 mg into the skin once a week., Disp: 1.5 mL, Rfl: 3   zolpidem (AMBIEN) 10 MG tablet, Take 1 tablet (10 mg total) by mouth at bedtime. For insomnia, Disp: 30 tablet, Rfl: 3  Observations/Objective: Patient is well-developed, well-nourished in no acute distress.  Resting comfortably at home.  Head is normocephalic, atraumatic.  No labored breathing.  Speech is clear and coherent with logical content.  Patient is alert and oriented at baseline.  Right eye is injected and mildly swollen  Assessment and Plan: 1. Bacterial conjunctivitis of right eye - trimethoprim-polymyxin b (POLYTRIM) ophthalmic solution; Place 1 drop into the right eye every 4 (four) hours. X 3-5 days  Dispense: 10 mL; Refill: 0  - Suspect bacterial conjunctivitis - Polytrim prescribed - Clean eye as needed, warm compresses - Good hand hygiene - Seek in person evaluation if symptoms worsen or fail to improve  Follow Up Instructions: I discussed the assessment and treatment plan with the patient. The patient was provided an opportunity to ask questions and all were answered. The patient agreed with the plan and demonstrated an understanding of the instructions.  A copy of instructions were sent to the patient via MyChart unless otherwise noted below.    The patient was advised to call back or seek an in-person evaluation if the symptoms worsen or if the condition fails to improve as anticipated.  Time:  I spent 8 minutes with the  patient via telehealth technology discussing the above problems/concerns.    Mar Daring, PA-C

## 2022-01-16 NOTE — Patient Instructions (Signed)
[name], thank you for joining Mar Daring, PA-C for today's virtual visit.  While this provider is not your primary care provider (PCP), if your PCP is located in our provider database this encounter information will be shared with them immediately following your visit.  Consent: (Patient) [name] provided verbal consent for this virtual visit at the beginning of the encounter.  Current Medications:  Current Outpatient Medications:    trimethoprim-polymyxin b (POLYTRIM) ophthalmic solution, Place 1 drop into the right eye every 4 (four) hours. X 3-5 days, Disp: 10 mL, Rfl: 0   anastrozole (ARIMIDEX) 1 MG tablet, TAKE 1 TABLET BY MOUTH EVERY DAY, Disp: 90 tablet, Rfl: 3   atenolol-chlorthalidone (TENORETIC) 50-25 MG tablet, TAKE 1/2 TABLET BY MOUTH EVERY DAY, Disp: 45 tablet, Rfl: 1   atorvastatin (LIPITOR) 20 MG tablet, TAKE 1 TABLET BY MOUTH EVERY DAY, Disp: 90 tablet, Rfl: 1   diclofenac Sodium (VOLTAREN) 1 % GEL, Apply 2 g topically 4 (four) times daily., Disp: 100 g, Rfl: 1   furosemide (LASIX) 40 MG tablet, TAKE 1 TABLET BY MOUTH EVERY DAY, Disp: 90 tablet, Rfl: 0   Semaglutide,0.25 or 0.5MG /DOS, (OZEMPIC, 0.25 OR 0.5 MG/DOSE,) 2 MG/1.5ML SOPN, Inject 0.5 mg into the skin once a week., Disp: 1.5 mL, Rfl: 3   zolpidem (AMBIEN) 10 MG tablet, Take 1 tablet (10 mg total) by mouth at bedtime. For insomnia, Disp: 30 tablet, Rfl: 3   Medications ordered in this encounter:  Meds ordered this encounter  Medications   trimethoprim-polymyxin b (POLYTRIM) ophthalmic solution    Sig: Place 1 drop into the right eye every 4 (four) hours. X 3-5 days    Dispense:  10 mL    Refill:  0    Order Specific Question:   Supervising Provider    Answer:   Sabra Heck, BRIAN [[id number]]     *If you need refills on other medications prior to your next appointment, please contact your pharmacy*  Follow-Up: Call back or seek an in-person evaluation if the symptoms worsen or if the condition fails  to improve as anticipated.  Other Instructions Bacterial Conjunctivitis, Adult Bacterial conjunctivitis is an infection of the clear membrane that covers the white part of the eye and the inner surface of the eyelid (conjunctiva). When the blood vessels in the conjunctiva become inflamed, the eye becomes red or pink. The eye often feels irritated or itchy. Bacterial conjunctivitis spreads easily from person to person (is contagious). It also spreads easily from one eye to the other eye. What are the causes? This condition is caused by bacteria. You may get the infection if you come into close contact with: A person who is infected with the bacteria. Items that are contaminated with the bacteria, such as a face towel, contact lens solution, or eye makeup. What increases the risk? You are more likely to develop this condition if: You are exposed to other people who have the infection. You wear contact lenses. You have a sinus infection. You have had a recent eye injury or surgery. You have a weak body defense system (immune system). You have a medical condition that causes dry eyes. What are the signs or symptoms? Symptoms of this condition include: Thick, yellowish discharge from the eye. This may turn into a crust on the eyelid overnight and cause your eyelids to stick together. Tearing or watery eyes. Itchy eyes. Burning feeling in your eyes. Eye redness. Swollen eyelids. Blurred vision. How is this diagnosed? This condition  is diagnosed based on your symptoms and medical history. Your health care provider may also take a sample of discharge from your eye to find the cause of your infection. How is this treated? This condition may be treated with: Antibiotic eye drops or ointment to clear the infection more quickly and prevent the spread of infection to others. Antibiotic medicines taken by mouth (orally) to treat infections that do not respond to drops or ointments or that last  longer than 10 days. Cool, wet cloths (cool compresses) placed on the eyes. Artificial tears applied 2-6 times a day. Follow these instructions at home: Medicines Take or apply your antibiotic medicine as told by your health care provider. Do not stop using the antibiotic, even if your condition improves, unless directed by your health care provider. Take or apply over-the-counter and prescription medicines only as told by your health care provider. Be very careful to avoid touching the edge of your eyelid with the eye-drop bottle or the ointment tube when you apply medicines to the affected eye. This will keep you from spreading the infection to your other eye or to other people. Managing discomfort Gently wipe away any drainage from your eye with a warm, wet washcloth or a cotton ball. Apply a clean, cool compress to your eye for 10-20 minutes, 3-4 times a day. General instructions Do not wear contact lenses until the inflammation is gone and your health care provider says it is safe to wear them again. Ask your health care provider how to sterilize or replace your contact lenses before you use them again. Wear glasses until you can resume wearing contact lenses. Avoid wearing eye makeup until the inflammation is gone. Throw away any old eye cosmetics that may be contaminated. Change or wash your pillowcase every day. Do not share towels or washcloths. This may spread the infection. Wash your hands often with soap and water for at least 20 seconds and especially before touching your face or eyes. Use paper towels to dry your hands. Avoid touching or rubbing your eyes. Do not drive or use heavy machinery if your vision is blurred. Contact a health care provider if: You have a fever. Your symptoms do not get better after 10 days. Get help right away if: You have a fever and your symptoms suddenly get worse. You have severe pain when you move your eye. You have facial pain, redness, or  swelling. You have a sudden loss of vision. Summary Bacterial conjunctivitis is an infection of the clear membrane that covers the white part of the eye and the inner surface of the eyelid (conjunctiva). Bacterial conjunctivitis spreads easily from eye to eye and from person to person (is contagious). Wash your hands often with soap and water for at least 20 seconds and especially before touching your face or eyes. Use paper towels to dry your hands. Take or apply your antibiotic medicine as told by your health care provider. Do not stop using the antibiotic even if your condition improves. Contact a health care provider if you have a fever or if your symptoms do not get better after 10 days. Get help right away if you have a sudden loss of vision. This information is not intended to replace advice given to you by your health care provider. Make sure you discuss any questions you have with your health care provider. Document Revised: 03/26/2021 Document Reviewed: 03/26/2021 Elsevier Patient Education  2022 [employer].    If you have been instructed to have  an in-person evaluation today at a local Urgent Care facility, please use the link below. It will take you to a list of all of our available [facility], including address, phone number and hours of operation. Please do not delay care.  [facility]  If you or a family member do not have a primary care provider, use the link below to schedule a visit and establish care. When you choose a [facility] primary care physician or advanced practice provider, you gain a long-term partner in health. Find a Primary Care Provider  Learn more about [facility]'s in-office and virtual care options: [facility]

## 2022-01-23 ENCOUNTER — Telehealth: Payer: BC Managed Care – PPO | Admitting: Nurse Practitioner

## 2022-01-23 ENCOUNTER — Encounter: Payer: BC Managed Care – PPO | Admitting: Physician Assistant

## 2022-01-23 ENCOUNTER — Ambulatory Visit
Admission: EM | Admit: 2022-01-23 | Discharge: 2022-01-23 | Disposition: A | Discharge status: Home / Self Care | Payer: BC Managed Care – PPO | Attending: Emergency Medicine | Admitting: Emergency Medicine

## 2022-01-23 ENCOUNTER — Other Ambulatory Visit: Payer: Self-pay

## 2022-01-23 ENCOUNTER — Encounter: Payer: Self-pay | Admitting: Emergency Medicine

## 2022-01-23 DIAGNOSIS — H1033 Unspecified acute conjunctivitis, bilateral: Secondary | ICD-10-CM | POA: Diagnosis not present

## 2022-01-23 DIAGNOSIS — H5789 Other specified disorders of eye and adnexa: Secondary | ICD-10-CM

## 2022-01-23 MED ORDER — OFLOXACIN 0.3 % OP SOLN
1.0000 [drp] | Freq: Four times a day (QID) | OPHTHALMIC | 0 refills | Status: DC
Start: 2022-01-23 — End: 2022-07-01

## 2022-01-23 NOTE — Progress Notes (Signed)
Duplicate evisit 

## 2022-01-23 NOTE — Discharge Instructions (Addendum)
Use the antibiotic eyedrops as prescribed.    Follow-up with your eye doctor for a recheck in 1 to 2 days if your symptoms are not improving.    Go to the emergency department if you have acute eye pain, changes in your vision, or other concerning symptoms.    

## 2022-01-23 NOTE — ED Provider Notes (Signed)
Roderic Palau    CSN: [id number] Arrival date & time: 01/23/22  1731      History   Chief Complaint Chief Complaint  Patient presents with   Eye Problem    HPI NEVIA HENKIN is a 61 y.o. female.  Patient presents with eye redness, itching, crusting in lashes, yellow-green mucus discharge x 1 week.  No eye trauma.  No acute eye pain or changes in vision.  No fever or other symptoms.  Patient had a video visit on 01/16/2022; diagnosed with bacterial conjunctivitis; treated with Polytrim eye drops in right eye only.  Her right eye improved but then she developed the same symptoms in her left eye.  She had an e-visit today and was instructed to be seen in person.    The history is provided by the patient.   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   OBSTRUCTIVE SLEEP APNEA 02/09/2011   Breast cancer of lower-outer quadrant of left female breast ([facility]) 01/14/2011   Vitamin D deficiency 12/30/2010   Hyperlipidemia 08/13/2009   Anxiety and depression 08/13/2009   HIP PAIN, RIGHT 06/29/2009   KNEE PAIN, RIGHT 06/29/2009   INSOMNIA 85/46/2703   METABOLIC SYNDROME X 50/08/3817   Morbid obesity ([facility]) 07/22/2007   Essential hypertension 07/22/2007    Past Surgical History:  Procedure Laterality Date   BREAST LUMPECTOMY Left 2012   BTL     CARPAL TUNNEL RELEASE     PORT-A-CATH REMOVAL  02/19/2012    Procedure: MINOR REMOVAL PORT-A-CATH;  Surgeon: Edward Jolly, MD;  Location: [facility];  Service: General;  Laterality: Right;   PORT-A-CATH REMOVAL  02/19/2012   Procedure: MINOR REMOVAL PORT-A-CATH;  Surgeon: Edward Jolly, MD;  Location: [facility];  Service: General;  Laterality: Right;   TONSILLECTOMY     TOTAL ABDOMINAL HYSTERECTOMY  2005    OB History   No obstetric history on file.      Home Medications    Prior to Admission medications   Medication Sig Start Date End Date Taking? Authorizing Provider  ofloxacin (OCUFLOX) 0.3 % ophthalmic solution Place 1 drop into both eyes 4 (four) times daily. 01/23/22  Yes Sharion Balloon, NP  anastrozole (ARIMIDEX) 1 MG tablet TAKE 1 TABLET BY MOUTH EVERY DAY 08/11/21   Nicholas Lose, MD  atenolol-chlorthalidone (TENORETIC) 50-25 MG tablet TAKE 1/2 TABLET BY MOUTH EVERY DAY 09/10/21   Midge Minium, MD  atorvastatin (LIPITOR) 20 MG tablet TAKE 1 TABLET BY MOUTH EVERY DAY 11/03/21   Midge Minium, MD  diclofenac Sodium (VOLTAREN) 1 % GEL Apply 2 g topically 4 (four) times daily. 01/02/20   Midge Minium, MD  furosemide (LASIX) 40 MG tablet TAKE 1 TABLET BY MOUTH EVERY DAY 10/03/21   Midge Minium, MD  Semaglutide,0.25 or 0.5MG /DOS, (OZEMPIC, 0.25 OR 0.5 MG/DOSE,) 2 MG/1.5ML SOPN Inject 0.5 mg into the skin once a week. 12/30/21   Midge Minium, MD  zolpidem (AMBIEN) 10 MG tablet Take 1 tablet (10 mg total) by mouth at bedtime. For insomnia 10/15/21   Midge Minium, MD    Family History Family History  Problem Relation Age of Onset   Colon cancer Maternal Uncle    Alcohol abuse Other    Asthma Other    Coronary artery disease Other    Hyperlipidemia Other    Hypertension Other     Social History Social History   Tobacco Use   Smoking status: Former    Types: Cigarettes    Quit date: 12/28/1989    Years since quitting: 32.0   Smokeless tobacco: Never  Vaping Use    Vaping Use: Never used  Substance Use Topics   Alcohol use: Yes    Comment: 1 glass of wine once a month   Drug use: No     Allergies   Patient has no known allergies.   Review of Systems Review of Systems  Constitutional:  Negative for chills and fever.  HENT:  Negative for ear pain and sore throat.   Eyes:  Positive for discharge, redness and itching. Negative for pain and visual disturbance.  Respiratory:  Negative for cough and shortness of breath.   Skin:  Negative for color change and rash.  All other systems reviewed and are negative.   Physical Exam Triage Vital Signs ED Triage Vitals  Enc Vitals Group     BP      Pulse      Resp      Temp      Temp src      SpO2      Weight      Height      Head Circumference      Peak Flow      Pain Score      Pain Loc      Pain Edu?      Excl. in [place]?    No data found.  Updated Vital Signs BP 133/84 (BP Location: Left Arm)    Pulse 78    Temp 98.2 F (36.8 C) (Oral)    Resp 18    SpO2 96%   Visual Acuity Right Eye Distance:   Left Eye Distance:   Bilateral Distance:    Right Eye Near:   Left Eye Near:    Bilateral Near:     Physical Exam Vitals and nursing note reviewed.  Constitutional:      General: She is not in acute distress.    Appearance: She is well-developed. She is not ill-appearing.  HENT:     Right Ear: Tympanic membrane normal.     Left Ear: Tympanic membrane normal.     Nose: Nose normal.     Mouth/Throat:     Mouth: Mucous membranes are moist.     Pharynx: Oropharynx is clear.  Eyes:     General: Vision grossly intact.     Extraocular Movements: Extraocular movements intact.     Conjunctiva/sclera:     Right eye: Right conjunctiva is injected.     Left eye: Left conjunctiva is injected.     Pupils: Pupils are equal, round, and reactive to light.     Comments: Conjunctiva injected L>R.  Left eye has mild edema of upper eyelid.    Cardiovascular:  Rate and Rhythm: Normal rate  and regular rhythm.     Heart sounds: Normal heart sounds.  Pulmonary:     Effort: Pulmonary effort is normal. No respiratory distress.     Breath sounds: Normal breath sounds.  Musculoskeletal:     Cervical back: Neck supple.  Skin:    General: Skin is warm and dry.  Neurological:     Mental Status: She is alert.  Psychiatric:        Mood and Affect: Mood normal.        Behavior: Behavior normal.     UC Treatments / Results  Labs (all labs ordered are listed, but only abnormal results are displayed) Labs Reviewed - No data to display  EKG   Radiology No results found.  Procedures Procedures (including critical care time)  Medications Ordered in UC Medications - No data to display  Initial Impression / Assessment and Plan / UC Course  I have reviewed the triage vital signs and the nursing notes.  Pertinent labs & imaging results that were available during my care of the patient were reviewed by me and considered in my medical decision making (see chart for details).   Acute bacterial conjunctivitis.  Treating with ofloxacin eye drops in both eyes x 7 days.  Instructed patient to follow up with her eye care provider if her symptoms are not improving in 1-2 days.  ED precautions discussed.  Education provided on conjunctivitis.  Patient agrees to plan of care.    Final Clinical Impressions(s) / UC Diagnoses   Final diagnoses:  Acute bacterial conjunctivitis of both eyes     Discharge Instructions      Use the antibiotic eyedrops as prescribed.    Follow-up with your eye doctor for a recheck in 1 to 2 days if your symptoms are not improving.    Go to the emergency department if you have acute eye pain, changes in your vision, or other concerning symptoms.        ED Prescriptions     Medication Sig Dispense Auth. Provider   ofloxacin (OCUFLOX) 0.3 % ophthalmic solution Place 1 drop into both eyes 4 (four) times daily. 5 mL Sharion Balloon, NP      PDMP  not reviewed this encounter.   Sharion Balloon, NP 01/23/22 [phone]

## 2022-01-23 NOTE — Progress Notes (Signed)
Based on what you shared with me it looks like you have red eye,that should be evaluated in a face to face office visit. You stated you started polytrim eye drops on 12/2021. If this has not relieved your symptoms then you will need to see your PCP or go to the eye doctor  NOTE: There will be NO CHARGE for this eVisit   If you are having a true medical emergency please call 911.      For an urgent face to face visit, [facility] has six urgent care centers for your convenience:     [facility] [place] [facility] [place] Get Driving Directions [phone] [place], [place] [address]    [facility]) Get Driving Directions [phone] [place], [place] [address]  [facility] ([place]) Get Driving Directions [phone] [address] [place],  [place]  [address]  [facility] Get Driving Directions [phone] [place], [place], [place] [address]   [facility] Get Driving Directions  [phone] [address] [place], [place] [address]   [facility] [place] Get Driving Directions [phone] [address], [place], [place] [address]  Your MyChart E-visit questionnaire answers were reviewed by a board certified advanced clinical practitioner to complete your personal care plan based on your specific symptoms.  Thank you for using e-Visits.

## 2022-01-23 NOTE — Progress Notes (Signed)
Duplicate

## 2022-01-23 NOTE — ED Triage Notes (Signed)
Pt here with bilateral eye redness and irritation. Started with right eye and was treated for that, but then left eye started shortly after.

## 2022-01-24 ENCOUNTER — Ambulatory Visit (HOSPITAL_COMMUNITY): Payer: Self-pay

## 2022-01-29 ENCOUNTER — Other Ambulatory Visit: Payer: Self-pay | Admitting: Family Medicine

## 2022-01-29 DIAGNOSIS — G4700 Insomnia, unspecified: Secondary | ICD-10-CM

## 2022-02-12 ENCOUNTER — Telehealth: Payer: Self-pay

## 2022-02-12 NOTE — Telephone Encounter (Signed)
Caller name:[name]   On DPR? :Yes  Call back number:[phone]  Provider they see: Birdie Riddle   Reason for call:Pt is calling she was on here January 3 and medication for Semaglutide,0.25 or 0.5MG /DOS, (OZEMPIC, 0.25 OR 0.5 MG/DOSE,) 2 MG/1.5ML SOPN [[id number]]   Pt has not been able to get the pharmacy is saying they have not received the medication can this be resent to the pharmacy?

## 2022-02-13 NOTE — Telephone Encounter (Signed)
Spoke to patient and let her know that I confirmed with pharmacy they received the medication and it was ready

## 2022-02-18 ENCOUNTER — Ambulatory Visit
Admission: RE | Admit: 2022-02-18 | Discharge: 2022-02-18 | Disposition: A | Discharge status: Home / Self Care | Payer: BC Managed Care – PPO | Source: Ambulatory Visit | Attending: Hematology and Oncology | Admitting: Hematology and Oncology

## 2022-02-18 DIAGNOSIS — Z1231 Encounter for screening mammogram for malignant neoplasm of breast: Secondary | ICD-10-CM

## 2022-03-06 ENCOUNTER — Encounter: Payer: Self-pay | Admitting: Emergency Medicine

## 2022-03-06 DIAGNOSIS — Z170 Estrogen receptor positive status [ER+]: Secondary | ICD-10-CM

## 2022-03-06 NOTE — Research (Signed)
[facility]U-[facility]-2102 - TREATMENT OF ESTABLISHED CHEMOTHERAPY-INDUCED NEUROPATHY WITH N-PALMITOYLETHANOLAMIDE, A CANNABIMIMETIC NUTRACEUTICAL: A RANDOMIZED DOUBLE-BLIND PHASE II PILOT TRIAL ? ?03/06/22 ? ?SURVIVAL FOLLOW-UP PHONE CALL: Patient was contacted via phone today to complete the 6 Months follow-up phone call. Identify was confirmed using two patient identifiers.  ? ?NEUROPATHY STATUS: Patient states her neuropathy symptoms are about the same as they were during the study.  She reports taking obtaining PEA on her own and taking for about two months after the study, but has since discontinued this as she didn't notice a significant improvement.  She is now taking gabapentin for her neuropathy symptoms which she states helps some.   ? ?CANCER RECURRENCE: Patient denies any cancer recurrence or problems.  She states she has not had any other changes to her medical history or medications. ? ?PLAN:  ?Patient has an appointment scheduled with Dr. Lindi Adie 08/17/2022.  Will plan to complete month 12 research follow up at that time. ? ?The patient was thanked for their time and continued voluntary participation in this study. Patient has been provided direct contact information and is encouraged to contact this Coordinator for any needs or questions. ? ?Clabe Seal ?Clinical Research Coordinator I  ?03/06/22 2:40 PM ?

## 2022-03-12 ENCOUNTER — Other Ambulatory Visit: Payer: Self-pay | Admitting: Family Medicine

## 2022-03-29 ENCOUNTER — Encounter: Payer: Self-pay | Admitting: Family Medicine

## 2022-04-10 ENCOUNTER — Other Ambulatory Visit: Payer: Self-pay | Admitting: Family Medicine

## 2022-06-18 ENCOUNTER — Telehealth: Payer: Self-pay | Admitting: Family Medicine

## 2022-06-18 ENCOUNTER — Other Ambulatory Visit: Payer: Self-pay | Admitting: Family Medicine

## 2022-06-18 DIAGNOSIS — G4700 Insomnia, unspecified: Secondary | ICD-10-CM

## 2022-06-18 NOTE — Telephone Encounter (Signed)
Pt called requesting a refill for zolpidem 10 mg

## 2022-06-18 NOTE — Telephone Encounter (Signed)
Informed pt Rx was sent to pharmacy

## 2022-07-01 ENCOUNTER — Ambulatory Visit (INDEPENDENT_AMBULATORY_CARE_PROVIDER_SITE_OTHER): Payer: BC Managed Care – PPO | Admitting: Family Medicine

## 2022-07-01 ENCOUNTER — Encounter: Payer: Self-pay | Admitting: Family Medicine

## 2022-07-01 VITALS — BP 130/80 | HR 69 | Temp 97.4°F | Resp 16 | Ht 62.0 in | Wt 208.4 lb

## 2022-07-01 DIAGNOSIS — Z0000 Encounter for general adult medical examination without abnormal findings: Secondary | ICD-10-CM | POA: Diagnosis not present

## 2022-07-01 DIAGNOSIS — Z23 Encounter for immunization: Secondary | ICD-10-CM

## 2022-07-01 DIAGNOSIS — E559 Vitamin D deficiency, unspecified: Secondary | ICD-10-CM | POA: Diagnosis not present

## 2022-07-01 DIAGNOSIS — Z1211 Encounter for screening for malignant neoplasm of colon: Secondary | ICD-10-CM

## 2022-07-01 LAB — CBC WITH DIFFERENTIAL/PLATELET
Basophils Absolute: 0 10*3/uL (ref 0.0–0.1)
Basophils Relative: 0.4 % (ref 0.0–3.0)
Eosinophils Absolute: 0.1 10*3/uL (ref 0.0–0.7)
Eosinophils Relative: 2.2 % (ref 0.0–5.0)
HCT: 38.1 % (ref 36.0–46.0)
Hemoglobin: 12.8 g/dL (ref 12.0–15.0)
Lymphocytes Relative: 33.7 % (ref 12.0–46.0)
Lymphs Abs: 1.7 10*3/uL (ref 0.7–4.0)
MCHC: 33.7 g/dL (ref 30.0–36.0)
MCV: 92.9 fl (ref 78.0–100.0)
Monocytes Absolute: 0.4 10*3/uL (ref 0.1–1.0)
Monocytes Relative: 8.1 % (ref 3.0–12.0)
Neutro Abs: 2.8 10*3/uL (ref 1.4–7.7)
Neutrophils Relative %: 55.6 % (ref 43.0–77.0)
Platelets: 292 10*3/uL (ref 150.0–400.0)
RBC: 4.1 Mil/uL (ref 3.87–5.11)
RDW: 13.4 % (ref 11.5–15.5)
WBC: 5 10*3/uL (ref 4.0–10.5)

## 2022-07-01 LAB — HEPATIC FUNCTION PANEL
ALT: 12 U/L (ref 0–35)
AST: 13 U/L (ref 0–37)
Albumin: 4.1 g/dL (ref 3.5–5.2)
Alkaline Phosphatase: 95 U/L (ref 39–117)
Bilirubin, Direct: 0 mg/dL (ref 0.0–0.3)
Total Bilirubin: 0.3 mg/dL (ref 0.2–1.2)
Total Protein: 7.2 g/dL (ref 6.0–8.3)

## 2022-07-01 LAB — BASIC METABOLIC PANEL
BUN: 13 mg/dL (ref 6–23)
CO2: 32 mEq/L (ref 19–32)
Calcium: 9.7 mg/dL (ref 8.4–10.5)
Chloride: 101 mEq/L (ref 96–112)
Creatinine, Ser: 0.76 mg/dL (ref 0.40–1.20)
GFR: 85.4 mL/min (ref >60.00)
Glucose, Bld: 92 mg/dL (ref 70–99)
Potassium: 3.7 mEq/L (ref 3.5–5.1)
Sodium: 139 mEq/L (ref 135–145)

## 2022-07-01 LAB — LIPID PANEL
Cholesterol: 157 mg/dL (ref 0–200)
HDL: 46.5 mg/dL (ref >39.00)
LDL Cholesterol: 96 mg/dL (ref 0–99)
NonHDL: 110.8
Total CHOL/HDL Ratio: 3
Triglycerides: 73 mg/dL (ref 0.0–149.0)
VLDL: 14.6 mg/dL (ref 0.0–40.0)

## 2022-07-01 LAB — TSH: TSH: 1.32 u[IU]/mL (ref 0.35–5.50)

## 2022-07-01 LAB — VITAMIN D 25 HYDROXY (VIT D DEFICIENCY, FRACTURES): VITD: 26.5 ng/mL — ABNORMAL LOW (ref 30.00–100.00)

## 2022-07-01 MED ORDER — OZEMPIC (0.25 OR 0.5 MG/DOSE) 2 MG/3ML ~~LOC~~ SOPN: 0.5000 mg | PEN_INJECTOR | SUBCUTANEOUS | 3 refills | Status: DC

## 2022-07-01 MED ORDER — NYSTATIN 100000 UNIT/GM EX POWD: 1.0000 | Freq: Three times a day (TID) | CUTANEOUS | 1 refills | Status: AC

## 2022-07-01 NOTE — Patient Instructions (Addendum)
Follow up in 6 months to recheck BP, cholesterol, and weight loss progress We'll notify you of your lab results and make any changes if needed Continue to work on healthy diet and regular exercise- you're doing it!! We'll call you with your GI appt for the colonoscopy Call with any questions or concerns Stay Safe!  Stay Healthy! Have a great summer!!

## 2022-07-01 NOTE — Progress Notes (Signed)
   Subjective:    Patient ID: [name], female    DOB: 12-Mar-1961, 61 y.o.   MRN: [id number]  HPI CPE- due for Tdap.  Colonoscopy due this year.  UTD on mammo.  Patient Care Team    Relationship Specialty Notifications Start End  Midge Minium, MD PCP - General   12/10/10   Servando Salina, MD Consulting Physician Obstetrics and Gynecology  05/22/15   Nicholas Lose, MD Consulting Physician Hematology and Oncology  05/22/15     Health Maintenance  Topic Date Due   TETANUS/TDAP  05/02/2022   COLONOSCOPY (Pts 45-29yr Insurance coverage will need to be confirmed)  07/08/2022   INFLUENZA VACCINE  07/28/2022   MAMMOGRAM  02/18/2023   COVID-19 Vaccine  Completed   Hepatitis C Screening  Completed   HIV Screening  Completed   HPV VACCINES  Aged Out   Zoster Vaccines- Shingrix  Discontinued      Review of Systems Patient reports no vision/ hearing changes, adenopathy,fever, weight change,  persistant/recurrent hoarseness , swallowing issues, chest pain, palpitations, edema, persistant/recurrent cough, hemoptysis, dyspnea (rest/exertional/paroxysmal nocturnal), gastrointestinal bleeding (melena, rectal bleeding), abdominal pain, significant heartburn, bowel changes, GU symptoms (dysuria, hematuria, incontinence), Gyn symptoms (abnormal  bleeding, pain),  syncope, focal weakness, memory loss, skin/hair/nail changes, abnormal bruising or bleeding, anxiety, or depression.   + chemo neuropathy    Objective:   Physical Exam General Appearance:    Alert, cooperative, no distress, appears stated age  Head:    Normocephalic, without obvious abnormality, atraumatic  Eyes:    PERRL, conjunctiva/corneas clear, EOM's intact both eyes  Ears:    Normal TM's and external ear canals, both ears  Nose:   Nares normal, septum midline, mucosa normal, no drainage    or sinus tenderness  Throat:   Lips, mucosa, and tongue normal; teeth and gums normal  Neck:   Supple, symmetrical, trachea  midline, no adenopathy;    Thyroid: no enlargement/tenderness/nodules  Back:     Symmetric, no curvature, ROM normal, no CVA tenderness  Lungs:     Clear to auscultation bilaterally, respirations unlabored  Chest Wall:    No tenderness or deformity   Heart:    Regular rate and rhythm, S1 and S2 normal, no murmur, rub   or gallop  Breast Exam:    Deferred to mammo  Abdomen:     Soft, non-tender, bowel sounds active all four quadrants,    no masses, no organomegaly  Genitalia:    Deferred to GYN  Rectal:    Extremities:   Extremities normal, atraumatic, no cyanosis or edema  Pulses:   2+ and symmetric all extremities  Skin:   Skin color, texture, turgor normal, no rashes or lesions  Lymph nodes:   Cervical, supraclavicular, and axillary nodes normal  Neurologic:   CNII-XII intact, normal strength, sensation and reflexes    throughout          Assessment & Plan:

## 2022-07-02 ENCOUNTER — Telehealth: Payer: Self-pay

## 2022-07-02 ENCOUNTER — Other Ambulatory Visit: Payer: Self-pay | Admitting: Family

## 2022-07-02 MED ORDER — VITAMIN D (ERGOCALCIFEROL) 1.25 MG (50000 UNIT) PO CAPS: 50000.0000 [IU] | ORAL_CAPSULE | ORAL | 0 refills | Status: DC

## 2022-07-02 NOTE — Telephone Encounter (Signed)
-----   Message from Kennyth Arnold, [place] sent at 07/02/2022  9:02 AM EDT ----- Vitamin D is low. I will send in a once weekly prescription to help increase your vitamin D. Cholesterol looks better! Great job!

## 2022-07-02 NOTE — Telephone Encounter (Signed)
Informed pt of lab results  

## 2022-07-27 NOTE — Assessment & Plan Note (Signed)
Check labs and replete prn. 

## 2022-07-27 NOTE — Assessment & Plan Note (Signed)
Pt's PE WNL w/ exception of obesity.  UTD on mammo.  Due for repeat colonoscopy- referral placed.  Tdap given.  Check labs.  Anticipatory guidance provided.

## 2022-07-27 NOTE — Assessment & Plan Note (Signed)
Ongoing issue for pt.  Stressed need for low carb diet and regular exercise.  Check labs to risk stratify.  Will follow. 

## 2022-08-13 NOTE — Progress Notes (Signed)
Patient Care Team: Midge Minium, MD as PCP - General Servando Salina, MD as Consulting Physician (Obstetrics and Gynecology) Nicholas Lose, MD as Consulting Physician (Hematology and Oncology)  DIAGNOSIS:  Encounter Diagnoses  Name Primary?   Malignant neoplasm of lower-outer quadrant of left breast of female, estrogen receptor positive ([name])    Breast cancer of lower-outer quadrant of left female breast ([name])     SUMMARY OF ONCOLOGIC HISTORY: Oncology History  Breast cancer of lower-outer quadrant of left female breast ([name])  01/20/2011 Surgery   Left breast lumpectomy: Invasive lobular carcinoma multiple foci 1.2 and 0.7 cm posterior margin and LCIS, micro met in one SLN, ER 100%, PR 100%, Ki-67 12%, HER-2 negative ratio 1.11: Oncotype DX 22, ROR14%   03/17/2011 - 07/17/2011 Chemotherapy   Adjuvant chemotherapy AC x4 followed by Taxol weekly x8   08/28/2011 - 10/13/2011 Radiation Therapy   Adjuvant radiation therapy   10/29/2011 -  Anti-estrogen oral therapy   Adjuvant tamoxifen 20 mg daily, stopped May 2016 started on anastrozole in May 2017 (and she became postmenopausal), plan of treatment 5 years     CHIEF COMPLIANT: Follow-up of left breast cancer on anastrozole therapy  INTERVAL HISTORY: [name] is a 61 y.o. with above-mentioned history of left breast cancer treated with lumpectomy, adjuvant chemotherapy, radiation, and who is currently on antiestrogen therapy with anastrozole. She presents to the clinic today for a follow-up. She states that she is doing good. She does have joint pain. She does have some pain and discomfort in the breast.   ALLERGIES:  has No Known Allergies.  MEDICATIONS:  Current Outpatient Medications  Medication Sig Dispense Refill   anastrozole (ARIMIDEX) 1 MG tablet Take 1 tablet (1 mg total) by mouth daily. 90 tablet 3   atenolol-chlorthalidone (TENORETIC) 50-25 MG tablet TAKE 1/2 TABLET BY MOUTH EVERY DAY 45 tablet 1    atorvastatin (LIPITOR) 10 MG tablet Take 10 mg by mouth daily.     atorvastatin (LIPITOR) 20 MG tablet TAKE 1 TABLET BY MOUTH EVERY DAY 90 tablet 1   diclofenac Sodium (VOLTAREN) 1 % GEL Apply 2 g topically 4 (four) times daily. 100 g 1   furosemide (LASIX) 40 MG tablet TAKE 1 TABLET BY MOUTH EVERY DAY 90 tablet 0   gabapentin (NEURONTIN) 400 MG capsule Take 400 mg by mouth 3 (three) times daily.     nystatin (MYCOSTATIN/NYSTOP) powder Apply 1 Application topically 3 (three) times daily. 60 g 1   Semaglutide,0.25 or 0.5MG/DOS, (OZEMPIC, 0.25 OR 0.5 MG/DOSE,) 2 MG/3ML SOPN Inject 0.5 mg into the skin once a week. 6 mL 3   Vitamin D, Ergocalciferol, (DRISDOL) 1.25 MG (50000 UNIT) CAPS capsule Take 1 capsule (50,000 Units total) by mouth every 7 (seven) days. 12 capsule 0   zolpidem (AMBIEN) 10 MG tablet TAKE 1 TABLET (10 MG TOTAL) BY MOUTH AT BEDTIME. FOR INSOMNIA 30 tablet 3   No current facility-administered medications for this visit.    PHYSICAL EXAMINATION: ECOG PERFORMANCE STATUS: 1 - Symptomatic but completely ambulatory  Vitals:   08/17/22 1331  BP: 125/66  Pulse: 77  Resp: 18  Temp: (!) 97.5 F (36.4 C)  SpO2: 100%   Filed Weights   08/17/22 1331  Weight: 202 lb (91.6 kg)    BREAST: No palpable masses or nodules in either right or left breasts. No palpable axillary supraclavicular or infraclavicular adenopathy no breast tenderness or nipple discharge. (exam performed in the presence of a chaperone)  LABORATORY DATA:  I have reviewed the data as listed    Latest Ref Rng & Units 07/01/2022    8:34 AM 12/30/2021    1:47 PM 06/26/2021    8:18 AM  CMP  Glucose 70 - 99 mg/dL 92  85  95   BUN 6 - 23 mg/dL '13  15  17   ' Creatinine 0.40 - 1.20 mg/dL 0.76  0.95  0.82   Sodium 135 - 145 mEq/L 139  139  140   Potassium 3.5 - 5.1 mEq/L 3.7  3.7  3.6   Chloride 96 - 112 mEq/L 101  99  100   CO2 19 - 32 mEq/L 32  31  31   Calcium 8.4 - 10.5 mg/dL 9.7  9.5  9.7   Total Protein 6.0 -  8.3 g/dL 7.2  7.4  7.5   Total Bilirubin 0.2 - 1.2 mg/dL 0.3  0.3  0.7   Alkaline Phos 39 - 117 U/L 95  99  114   AST 0 - 37 U/L '13  13  15   ' ALT 0 - 35 U/L '12  14  13     ' Lab Results  Component Value Date   WBC 5.0 07/01/2022   HGB 12.8 07/01/2022   HCT 38.1 07/01/2022   MCV 92.9 07/01/2022   PLT 292.0 07/01/2022   NEUTROABS 2.8 07/01/2022    ASSESSMENT & PLAN:  Breast cancer of lower-outer quadrant of left female breast 01/20/2011: Left breast cancer stage II invasive lobular with one positive lymph node for micrometastatic disease status post lumpectomy followed by adjuvant chemotherapy with a.c. x4 followed by Taxol x8 and status post radiation therapy. Currently on tamoxifen 20 mg daily started in November 2012 but she stopped taking it May 2015 due to severe hot flashes.    Current treatment: Anastrozole 1 mg daily started May 2017 (when she became postmenopausal), plan to continue for 7 to 10 years total duration   Anastrozole toxicities: 1. Hot flashes not as severe as tamoxifen 2. mild musculoskeletal discomforts   Breast Cancer Surveillance: 1. Breast exam 08/17/2022: No palpable lumps or nodules of concern 2. Mammogram: 02/18/2022: Benign breast density category B   Severe chemo induced peripheral neuropathy: Currently on gabapentin 400 mg 3 times daily.     Return to clinic in 1 year for follow-up After that she could be seen on an as-needed basis.   No orders of the defined types were placed in this encounter.  The patient has a good understanding of the overall plan. she agrees with it. she will call with any problems that may develop before the next visit here. Total time spent: 30 mins including face to face time and time spent for planning, charting and co-ordination of care   Harriette Ohara, MD 08/17/22    I Gardiner Coins am scribing for Dr. Lindi Adie  I have reviewed the above documentation for accuracy and completeness, and I agree with the above.

## 2022-08-17 ENCOUNTER — Other Ambulatory Visit: Payer: Self-pay

## 2022-08-17 ENCOUNTER — Inpatient Hospital Stay: Payer: BC Managed Care – PPO | Attending: Hematology and Oncology | Admitting: Hematology and Oncology

## 2022-08-17 DIAGNOSIS — Z853 Personal history of malignant neoplasm of breast: Secondary | ICD-10-CM | POA: Diagnosis not present

## 2022-08-17 DIAGNOSIS — Z923 Personal history of irradiation: Secondary | ICD-10-CM | POA: Insufficient documentation

## 2022-08-17 DIAGNOSIS — Z9221 Personal history of antineoplastic chemotherapy: Secondary | ICD-10-CM | POA: Insufficient documentation

## 2022-08-17 DIAGNOSIS — Z791 Long term (current) use of non-steroidal anti-inflammatories (NSAID): Secondary | ICD-10-CM | POA: Insufficient documentation

## 2022-08-17 DIAGNOSIS — Z170 Estrogen receptor positive status [ER+]: Secondary | ICD-10-CM | POA: Insufficient documentation

## 2022-08-17 DIAGNOSIS — R232 Flushing: Secondary | ICD-10-CM | POA: Insufficient documentation

## 2022-08-17 DIAGNOSIS — M2550 Pain in unspecified joint: Secondary | ICD-10-CM | POA: Diagnosis not present

## 2022-08-17 DIAGNOSIS — G620 Drug-induced polyneuropathy: Secondary | ICD-10-CM | POA: Diagnosis not present

## 2022-08-17 DIAGNOSIS — C50512 Malignant neoplasm of lower-outer quadrant of left female breast: Secondary | ICD-10-CM | POA: Insufficient documentation

## 2022-08-17 DIAGNOSIS — Z79899 Other long term (current) drug therapy: Secondary | ICD-10-CM | POA: Insufficient documentation

## 2022-08-17 DIAGNOSIS — T451X5A Adverse effect of antineoplastic and immunosuppressive drugs, initial encounter: Secondary | ICD-10-CM | POA: Diagnosis not present

## 2022-08-17 DIAGNOSIS — Z79811 Long term (current) use of aromatase inhibitors: Secondary | ICD-10-CM | POA: Diagnosis not present

## 2022-08-17 MED ORDER — ANASTROZOLE 1 MG PO TABS: 1.0000 mg | ORAL_TABLET | Freq: Every day | ORAL | 3 refills | Status: DC

## 2022-08-17 NOTE — Assessment & Plan Note (Addendum)
01/20/2011: Left breast cancer stage II invasive lobular with one positive lymph node for micrometastatic disease status post lumpectomy followed by adjuvant chemotherapy with a.c. x4 followed by Taxol x8 and status post radiation therapy. Currently on tamoxifen 20 mg daily started in November 2012 but she stopped taking it May 2015 due to severe hot flashes.   Current treatment: Anastrozole 1 mg daily started May 2017 (when she became postmenopausal), plan to continuefor 7 to 10 years total duration  Anastrozole toxicities: 1. Hot flashes not as severe as tamoxifen 2.mild musculoskeletal discomforts  Breast Cancer Surveillance: 1. Breast exam8/21/2023:No palpable lumps or nodules of concern 2. Mammogram:02/18/2022:Benign breast density category B  Severe chemo induced peripheral neuropathy: Currently on gabapentin 400 mg 3 times daily.    Return to clinic in 1 year for follow-up

## 2022-08-21 ENCOUNTER — Other Ambulatory Visit: Payer: Self-pay | Admitting: Hematology and Oncology

## 2022-09-04 ENCOUNTER — Encounter: Payer: Self-pay | Admitting: Internal Medicine

## 2022-09-08 ENCOUNTER — Telehealth: Payer: Self-pay | Admitting: Emergency Medicine

## 2022-09-08 NOTE — Telephone Encounter (Signed)
[facility]102 - TREATMENT OF ESTABLISHED CHEMOTHERAPY-INDUCED NEUROPATHY WITH N-PALMITOYLETHANOLAMIDE, A CANNABIMIMETIC NUTRACEUTICAL: A RANDOMIZED DOUBLE-BLIND PHASE II PILOT TRIAL  Attempted 12 month f/u call, no answer.  Left VM requesting call back.  Wells Guiles 'Learta CoddingNeysa Bonito, RN, BSN Clinical Research Nurse I 09/08/22 11:19 AM

## 2022-09-11 ENCOUNTER — Telehealth: Payer: Self-pay | Admitting: Emergency Medicine

## 2022-09-11 NOTE — Telephone Encounter (Signed)
[facility]-2102 - TREATMENT OF ESTABLISHED CHEMOTHERAPY-INDUCED NEUROPATHY WITH N-PALMITOYLETHANOLAMIDE, A CANNABIMIMETIC NUTRACEUTICAL: A RANDOMIZED DOUBLE-BLIND PHASE II PILOT TRIAL  Called to do 12 month f/u call.  [place]  Requested to be called back Monday of next week.  Will call then.  Wells Guiles 'Learta CoddingNeysa Bonito, RN, BSN Clinical Research Nurse I 09/11/22 9:29 AM

## 2022-09-14 ENCOUNTER — Telehealth: Payer: Self-pay | Admitting: Emergency Medicine

## 2022-09-14 NOTE — Telephone Encounter (Signed)
[facility]102 - TREATMENT OF ESTABLISHED CHEMOTHERAPY-INDUCED NEUROPATHY WITH N-PALMITOYLETHANOLAMIDE, A CANNABIMIMETIC NUTRACEUTICAL: A RANDOMIZED DOUBLE-BLIND PHASE II PILOT TRIAL  12 MONTH F/U CALL  Called pt for 12 month f/u call.  Verified correct pt identity using two identifiers.  Patient reports ongoing but well managed CIPN and taking gabapentin 400 mg three times daily.  Pt denies any recurrent cancer problems.  Pt denies any questions/concerns, aware to f/u as needed.  Wells Guiles 'Learta CoddingNeysa Bonito, RN, BSN Clinical Research Nurse I 09/14/22 12:09 PM

## 2022-10-06 ENCOUNTER — Other Ambulatory Visit: Payer: Self-pay | Admitting: Family Medicine

## 2022-10-06 DIAGNOSIS — G4700 Insomnia, unspecified: Secondary | ICD-10-CM

## 2022-10-06 NOTE — Telephone Encounter (Signed)
Ambien 10 mg LOV: 07/01/22 Last Refill:06/18/22 Upcoming appt: none

## 2022-10-20 ENCOUNTER — Other Ambulatory Visit: Payer: Self-pay | Admitting: Hematology and Oncology

## 2022-10-20 DIAGNOSIS — Z1231 Encounter for screening mammogram for malignant neoplasm of breast: Secondary | ICD-10-CM

## 2022-11-30 ENCOUNTER — Telehealth: Payer: Self-pay | Admitting: Emergency Medicine

## 2022-11-30 NOTE — Telephone Encounter (Signed)
[facility]102 - TREATMENT OF ESTABLISHED CHEMOTHERAPY-INDUCED NEUROPATHY WITH N-PALMITOYLETHANOLAMIDE, A CANNABIMIMETIC NUTRACEUTICAL: A RANDOMIZED DOUBLE-BLIND PHASE II PILOT TRIAL  Received VM from patient who states she was returning Research call.  No call initiated by this Research Nurse.  Attempted to call back, no answer.  ROI on file.  Left VM stating that patient had completed study and no further follow up was indicated.  Wells Guiles 'Learta CoddingNeysa Bonito, RN, BSN Clinical Research Nurse I 11/30/22 4:46 PM

## 2022-12-15 ENCOUNTER — Other Ambulatory Visit: Payer: Self-pay | Admitting: Family Medicine

## 2023-02-19 ENCOUNTER — Ambulatory Visit
Admission: RE | Admit: 2023-02-19 | Discharge: 2023-02-19 | Disposition: A | Discharge status: Home / Self Care | Payer: BC Managed Care – PPO | Source: Ambulatory Visit | Attending: Hematology and Oncology | Admitting: Hematology and Oncology

## 2023-02-19 DIAGNOSIS — Z1231 Encounter for screening mammogram for malignant neoplasm of breast: Secondary | ICD-10-CM

## 2023-02-20 ENCOUNTER — Other Ambulatory Visit: Payer: Self-pay | Admitting: Family Medicine

## 2023-02-20 DIAGNOSIS — G4700 Insomnia, unspecified: Secondary | ICD-10-CM

## 2023-06-13 ENCOUNTER — Other Ambulatory Visit: Payer: Self-pay | Admitting: Family Medicine

## 2023-06-16 DIAGNOSIS — M25511 Pain in right shoulder: Secondary | ICD-10-CM | POA: Insufficient documentation

## 2023-06-17 DIAGNOSIS — S46019A Strain of muscle(s) and tendon(s) of the rotator cuff of unspecified shoulder, initial encounter: Secondary | ICD-10-CM | POA: Insufficient documentation

## 2023-06-18 ENCOUNTER — Other Ambulatory Visit: Payer: Self-pay | Admitting: Family Medicine

## 2023-06-18 DIAGNOSIS — G4700 Insomnia, unspecified: Secondary | ICD-10-CM

## 2023-06-21 NOTE — Telephone Encounter (Signed)
Ambien 10 mg LOV: 07/01/22 Last Refill:02/22/23 Upcoming appt: 07/15/23

## 2023-06-21 NOTE — Telephone Encounter (Signed)
Pt aware of refill.

## 2023-06-22 ENCOUNTER — Telehealth: Payer: Self-pay

## 2023-06-22 NOTE — Telephone Encounter (Signed)
Patient Advocate Encounter  Received a fax from [employer] regarding Prior Authorization for Ozempic (0.25 or 0.5 MG/DOSE) 2MG /3ML pen-injectors.   Authorization has been DENIED due to    Determination letter attached to patient chart

## 2023-06-23 MED ORDER — WEGOVY 0.5 MG/0.5ML ~~LOC~~ SOAJ: 0.5000 mg | SUBCUTANEOUS | 1 refills | Status: DC

## 2023-06-23 NOTE — Telephone Encounter (Signed)
Please let pt know that without the diagnosis of diabetes we will need to try the weight loss versions of these medication- Wegovy or Zepbound.  Most insurances are not covering these medications so she should call her insurance plan and see if they have a preferred medication

## 2023-06-23 NOTE — Telephone Encounter (Signed)
I informed the pt and she would like to try the [facility] can we send in ? Pt is aware it may require a PA and that they may not cover the medication

## 2023-06-23 NOTE — Telephone Encounter (Signed)
Noted once we receive this we will send to pharmacy team

## 2023-06-23 NOTE — Telephone Encounter (Signed)
Prescription for Wegovy sent to pharmacy

## 2023-06-23 NOTE — Telephone Encounter (Signed)
Will await prior auth if needed.

## 2023-07-15 ENCOUNTER — Encounter: Payer: Self-pay | Admitting: Family Medicine

## 2023-07-15 ENCOUNTER — Ambulatory Visit (INDEPENDENT_AMBULATORY_CARE_PROVIDER_SITE_OTHER): Payer: BC Managed Care – PPO | Admitting: Family Medicine

## 2023-07-15 VITALS — BP 130/80 | HR 75 | Temp 97.9°F | Resp 18 | Ht 62.0 in | Wt 183.4 lb

## 2023-07-15 DIAGNOSIS — I10 Essential (primary) hypertension: Secondary | ICD-10-CM | POA: Diagnosis not present

## 2023-07-15 DIAGNOSIS — G620 Drug-induced polyneuropathy: Secondary | ICD-10-CM | POA: Diagnosis not present

## 2023-07-15 DIAGNOSIS — C50512 Malignant neoplasm of lower-outer quadrant of left female breast: Secondary | ICD-10-CM | POA: Diagnosis not present

## 2023-07-15 DIAGNOSIS — G4700 Insomnia, unspecified: Secondary | ICD-10-CM

## 2023-07-15 DIAGNOSIS — Z1211 Encounter for screening for malignant neoplasm of colon: Secondary | ICD-10-CM

## 2023-07-15 DIAGNOSIS — E559 Vitamin D deficiency, unspecified: Secondary | ICD-10-CM | POA: Diagnosis not present

## 2023-07-15 DIAGNOSIS — Z0000 Encounter for general adult medical examination without abnormal findings: Secondary | ICD-10-CM | POA: Diagnosis not present

## 2023-07-15 LAB — CBC WITH DIFFERENTIAL/PLATELET
Basophils Absolute: 0 10*3/uL (ref 0.0–0.1)
Basophils Relative: 0.8 % (ref 0.0–3.0)
Eosinophils Absolute: 0 10*3/uL (ref 0.0–0.7)
Eosinophils Relative: 1.2 % (ref 0.0–5.0)
HCT: 40 % (ref 36.0–46.0)
Hemoglobin: 13.1 g/dL (ref 12.0–15.0)
Lymphocytes Relative: 42.4 % (ref 12.0–46.0)
Lymphs Abs: 1.5 10*3/uL (ref 0.7–4.0)
MCHC: 32.8 g/dL (ref 30.0–36.0)
MCV: 94.3 fl (ref 78.0–100.0)
Monocytes Absolute: 0.4 10*3/uL (ref 0.1–1.0)
Monocytes Relative: 10.3 % (ref 3.0–12.0)
Neutro Abs: 1.6 10*3/uL (ref 1.4–7.7)
Neutrophils Relative %: 45.3 % (ref 43.0–77.0)
Platelets: 269 10*3/uL (ref 150.0–400.0)
RBC: 4.24 Mil/uL (ref 3.87–5.11)
RDW: 14.2 % (ref 11.5–15.5)
WBC: 3.6 10*3/uL — ABNORMAL LOW (ref 4.0–10.5)

## 2023-07-15 LAB — HEPATIC FUNCTION PANEL
ALT: 17 U/L (ref 0–35)
AST: 15 U/L (ref 0–37)
Albumin: 4 g/dL (ref 3.5–5.2)
Alkaline Phosphatase: 88 U/L (ref 39–117)
Bilirubin, Direct: 0.1 mg/dL (ref 0.0–0.3)
Total Bilirubin: 0.4 mg/dL (ref 0.2–1.2)
Total Protein: 7.2 g/dL (ref 6.0–8.3)

## 2023-07-15 LAB — LIPID PANEL
Cholesterol: 187 mg/dL (ref 0–200)
HDL: 56.9 mg/dL (ref >39.00)
LDL Cholesterol: 114 mg/dL — ABNORMAL HIGH (ref 0–99)
NonHDL: 130.38
Total CHOL/HDL Ratio: 3
Triglycerides: 84 mg/dL (ref 0.0–149.0)
VLDL: 16.8 mg/dL (ref 0.0–40.0)

## 2023-07-15 LAB — BASIC METABOLIC PANEL
BUN: 8 mg/dL (ref 6–23)
CO2: 31 mEq/L (ref 19–32)
Calcium: 9.9 mg/dL (ref 8.4–10.5)
Chloride: 102 mEq/L (ref 96–112)
Creatinine, Ser: 0.73 mg/dL (ref 0.40–1.20)
GFR: 88.98 mL/min (ref >60.00)
Glucose, Bld: 85 mg/dL (ref 70–99)
Potassium: 4 mEq/L (ref 3.5–5.1)
Sodium: 137 mEq/L (ref 135–145)

## 2023-07-15 LAB — VITAMIN D 25 HYDROXY (VIT D DEFICIENCY, FRACTURES): VITD: 24.46 ng/mL — ABNORMAL LOW (ref 30.00–100.00)

## 2023-07-15 LAB — TSH: TSH: 1.26 u[IU]/mL (ref 0.35–5.50)

## 2023-07-15 MED ORDER — ZOLPIDEM TARTRATE 10 MG PO TABS: 10.0000 mg | ORAL_TABLET | Freq: Every day | ORAL | 3 refills | Status: DC

## 2023-07-15 NOTE — Assessment & Plan Note (Signed)
Pt's PE WNL w/ exception of BMI.  UTD on mammo, Tdap.  Pt is going to do cologuard rather than colonoscopy.  Check labs.  Anticipatory guidance provided.

## 2023-07-15 NOTE — Assessment & Plan Note (Signed)
S/p chemo.  Unchanged at this time

## 2023-07-15 NOTE — Assessment & Plan Note (Signed)
Continues Arimidex daily

## 2023-07-15 NOTE — Progress Notes (Signed)
   Subjective:    Patient ID: [name], female    DOB: Dec 29, 1960, 62 y.o.   MRN: [id number]  HPI CPE- UTD on mammo, Tdap.  Patient Care Team    Relationship Specialty Notifications Start End  Sheliah Hatch, MD PCP - General   12/10/10   Maxie Better, MD Consulting Physician Obstetrics and Gynecology  05/22/15   Serena Croissant, MD Consulting Physician Hematology and Oncology  05/22/15      Health Maintenance  Topic Date Due   Colonoscopy  07/08/2022   INFLUENZA VACCINE  07/29/2023   MAMMOGRAM  02/20/2024   DTaP/Tdap/Td (4 - Td or Tdap) 07/01/2032   Hepatitis C Screening  Completed   HIV Screening  Completed   HPV VACCINES  Aged Out   COVID-19 Vaccine  Discontinued   Zoster Vaccines- Shingrix  Discontinued      Review of Systems Patient reports no vision/ hearing changes, adenopathy,fever, weight change,  persistant/recurrent hoarseness , swallowing issues, chest pain, palpitations, edema, persistant/recurrent cough, hemoptysis, dyspnea (rest/exertional/paroxysmal nocturnal), gastrointestinal bleeding (melena, rectal bleeding), abdominal pain, significant heartburn, bowel changes, GU symptoms (dysuria, hematuria, incontinence), Gyn symptoms (abnormal  bleeding, pain),  syncope, focal weakness, memory loss, numbness & tingling, skin/hair/nail changes, abnormal bruising or bleeding, anxiety, or depression.     Objective:   Physical Exam General Appearance:    Alert, cooperative, no distress, appears stated age  Head:    Normocephalic, without obvious abnormality, atraumatic  Eyes:    PERRL, conjunctiva/corneas clear, EOM's intact both eyes  Ears:    Normal TM's and external ear canals, both ears  Nose:   Nares normal, septum midline, mucosa normal, no drainage    or sinus tenderness  Throat:   Lips, mucosa, and tongue normal; teeth and gums normal  Neck:   Supple, symmetrical, trachea midline, no adenopathy;    Thyroid: no enlargement/tenderness/nodules  Back:      Symmetric, no curvature, ROM normal, no CVA tenderness  Lungs:     Clear to auscultation bilaterally, respirations unlabored  Chest Wall:    No tenderness or deformity   Heart:    Regular rate and rhythm, S1 and S2 normal, no murmur, rub   or gallop  Breast Exam:    Deferred to GYN  Abdomen:     Soft, non-tender, bowel sounds active all four quadrants,    no masses, no organomegaly  Genitalia:    Deferred to GYN  Rectal:    Extremities:   Extremities normal, atraumatic, no cyanosis or edema  Pulses:   2+ and symmetric all extremities  Skin:   Skin color, texture, turgor normal, no rashes or lesions  Lymph nodes:   Cervical, supraclavicular, and axillary nodes normal  Neurologic:   CNII-XII intact, normal strength, sensation and reflexes    throughout          Assessment & Plan:

## 2023-07-15 NOTE — Patient Instructions (Addendum)
Follow up in 6 months to recheck BP and cholesterol We'll notify you of your lab results and make any changes if needed Keep up the good work on healthy diet and regular exercise- you look great!! Complete and return the cologuard as directed Call with any questions or concerns Stay Safe!  Stay Healthy! Have a great summer!!!

## 2023-07-15 NOTE — Assessment & Plan Note (Signed)
Check labs and replete prn. 

## 2023-07-16 ENCOUNTER — Other Ambulatory Visit: Payer: Self-pay

## 2023-07-16 ENCOUNTER — Telehealth: Payer: Self-pay

## 2023-07-16 DIAGNOSIS — E559 Vitamin D deficiency, unspecified: Secondary | ICD-10-CM

## 2023-07-16 MED ORDER — VITAMIN D (ERGOCALCIFEROL) 1.25 MG (50000 UNIT) PO CAPS: 50000.0000 [IU] | ORAL_CAPSULE | ORAL | 12 refills | Status: AC

## 2023-07-16 NOTE — Telephone Encounter (Signed)
-----   Message from [name] sent at 07/16/2023  7:49 AM EDT ----- Labs look great w/ exception of low Vit D.  Based on this, we need to start 50,000 units weekly x12 weeks in addition to daily OTC supplement of at least 2000 units.

## 2023-07-16 NOTE — Telephone Encounter (Signed)
Pt aware of lab results vit d 50,000 units sent

## 2023-08-03 ENCOUNTER — Other Ambulatory Visit: Payer: Self-pay

## 2023-08-03 MED ORDER — ATENOLOL-CHLORTHALIDONE 50-25 MG PO TABS: ORAL_TABLET | ORAL | 1 refills | Status: DC

## 2023-08-03 MED ORDER — ATORVASTATIN CALCIUM 20 MG PO TABS: 20.0000 mg | ORAL_TABLET | Freq: Every day | ORAL | 1 refills | Status: DC

## 2023-08-04 ENCOUNTER — Telehealth: Payer: Self-pay | Admitting: Hematology and Oncology

## 2023-08-04 NOTE — Telephone Encounter (Signed)
Patient is aware of rescheduled appointment times/dates 

## 2023-08-18 ENCOUNTER — Inpatient Hospital Stay: Payer: BC Managed Care – PPO | Admitting: Hematology and Oncology

## 2023-09-07 ENCOUNTER — Other Ambulatory Visit: Payer: Self-pay | Admitting: Family Medicine

## 2023-09-07 ENCOUNTER — Encounter: Payer: Self-pay | Admitting: Family Medicine

## 2023-09-07 ENCOUNTER — Other Ambulatory Visit: Payer: Self-pay | Admitting: Hematology and Oncology

## 2023-09-07 NOTE — Telephone Encounter (Signed)
Pt concerns about cost of Wegovy, and requesting Ozempic be called in under DM 2 dx notes Insurance should cover this   Please advise

## 2023-09-13 ENCOUNTER — Inpatient Hospital Stay: Payer: BC Managed Care – PPO | Attending: Hematology and Oncology | Admitting: Hematology and Oncology

## 2023-09-13 VITALS — BP 129/72 | HR 74 | Temp 97.4°F | Resp 18 | Ht 62.0 in | Wt 184.6 lb

## 2023-09-13 DIAGNOSIS — C50512 Malignant neoplasm of lower-outer quadrant of left female breast: Secondary | ICD-10-CM | POA: Diagnosis present

## 2023-09-13 DIAGNOSIS — Z170 Estrogen receptor positive status [ER+]: Secondary | ICD-10-CM

## 2023-09-13 DIAGNOSIS — Z923 Personal history of irradiation: Secondary | ICD-10-CM | POA: Insufficient documentation

## 2023-09-13 DIAGNOSIS — E1142 Type 2 diabetes mellitus with diabetic polyneuropathy: Secondary | ICD-10-CM | POA: Diagnosis not present

## 2023-09-13 DIAGNOSIS — Z79811 Long term (current) use of aromatase inhibitors: Secondary | ICD-10-CM | POA: Insufficient documentation

## 2023-09-13 DIAGNOSIS — G620 Drug-induced polyneuropathy: Secondary | ICD-10-CM | POA: Diagnosis not present

## 2023-09-13 DIAGNOSIS — Z79899 Other long term (current) drug therapy: Secondary | ICD-10-CM | POA: Insufficient documentation

## 2023-09-13 DIAGNOSIS — Z9221 Personal history of antineoplastic chemotherapy: Secondary | ICD-10-CM | POA: Insufficient documentation

## 2023-09-13 DIAGNOSIS — T451X5A Adverse effect of antineoplastic and immunosuppressive drugs, initial encounter: Secondary | ICD-10-CM | POA: Diagnosis not present

## 2023-09-13 DIAGNOSIS — R232 Flushing: Secondary | ICD-10-CM | POA: Diagnosis not present

## 2023-09-13 NOTE — Progress Notes (Signed)
Patient Care Team: Sheliah Hatch, MD as PCP - General Maxie Better, MD as Consulting Physician (Obstetrics and Gynecology) Serena Croissant, MD as Consulting Physician (Hematology and Oncology)  DIAGNOSIS:  Encounter Diagnosis  Name Primary?   Malignant neoplasm of lower-outer quadrant of left breast of female, estrogen receptor positive (H[facility]) Yes    SUMMARY OF ONCOLOGIC HISTORY: Oncology History  Breast cancer of lower-outer quadrant of left female breast ([facility])  01/20/2011 Surgery   Left breast lumpectomy: Invasive lobular carcinoma multiple foci 1.2 and 0.7 cm posterior margin and LCIS, micro met in one SLN, ER 100%, PR 100%, Ki-67 12%, HER-2 negative ratio 1.11: Oncotype DX 22, ROR14%   03/17/2011 - 07/17/2011 Chemotherapy   Adjuvant chemotherapy AC x4 followed by Taxol weekly x8   08/28/2011 - 10/13/2011 Radiation Therapy   Adjuvant radiation therapy   10/29/2011 -  Anti-estrogen oral therapy   Adjuvant tamoxifen 20 mg daily, stopped May 2016 started on anastrozole in May 2017 (and she became postmenopausal), plan of treatment 5 years     CHIEF COMPLIANT:   Discussed the use of AI scribe software for clinical note transcription with the patient, who gave verbal consent to proceed.  History of Present Illness   The patient, a cancer survivor since 2012, presents for a routine follow-up. She reports significant weight loss of about 30-35 pounds over the past year, largely attributed to the use of Ozempic. However, the patient's insurance stopped covering the medication unless it's used for type 2 diabetes. Despite this, the patient views the medication as a good jumpstart to her weight loss journey. She reports that the weight loss has helped manage her neuropathy symptoms, allowing her to walk more comfortably. The patient is committed to maintaining her weight loss and improving her health through dietary changes and increased physical activity.         ALLERGIES:   has No Known Allergies.  MEDICATIONS:  Current Outpatient Medications  Medication Sig Dispense Refill   anastrozole (ARIMIDEX) 1 MG tablet Take 1 tablet (1 mg total) by mouth daily. 90 tablet 3   atenolol-chlorthalidone (TENORETIC) 50-25 MG tablet TAKE 1/2 TABLET BY MOUTH EVERY DAY 45 tablet 1   atorvastatin (LIPITOR) 20 MG tablet Take 1 tablet (20 mg total) by mouth daily. 90 tablet 1   gabapentin (NEURONTIN) 400 MG capsule TAKE 1 CAPSULE BY MOUTH THREE TIMES A DAY 270 capsule 3   nystatin (MYCOSTATIN/NYSTOP) powder Apply 1 Application topically 3 (three) times daily. 60 g 1   Vitamin D, Ergocalciferol, (DRISDOL) 1.25 MG (50000 UNIT) CAPS capsule Take 1 capsule (50,000 Units total) by mouth every 7 (seven) days. 7 capsule 12   zolpidem (AMBIEN) 10 MG tablet Take 1 tablet (10 mg total) by mouth at bedtime. For insomnia 30 tablet 3   diclofenac Sodium (VOLTAREN) 1 % GEL Apply 2 g topically 4 (four) times daily. (Patient not taking: Reported on 09/13/2023) 100 g 1   No current facility-administered medications for this visit.    PHYSICAL EXAMINATION: ECOG PERFORMANCE STATUS: 1 - Symptomatic but completely ambulatory  Vitals:   09/13/23 1531  BP: 129/72  Pulse: 74  Resp: 18  Temp: (!) 97.4 F (36.3 C)  SpO2: 100%   Filed Weights   09/13/23 1531  Weight: 184 lb 9.6 oz (83.7 kg)    Physical Exam   BREAST: Category B density.      (exam performed in the presence of a chaperone)  LABORATORY DATA:  I have reviewed the data  as listed    Latest Ref Rng & Units 07/15/2023    1:47 PM 07/01/2022    8:34 AM 12/30/2021    1:47 PM  CMP  Glucose 70 - 99 mg/dL 85  92  85   BUN 6 - 23 mg/dL 8  13  15    Creatinine 0.40 - 1.20 mg/dL 4.01  0.27  2.53   Sodium 135 - 145 mEq/L 137  139  139   Potassium 3.5 - 5.1 mEq/L 4.0  3.7  3.7   Chloride 96 - 112 mEq/L 102  101  99   CO2 19 - 32 mEq/L 31  32  31   Calcium 8.4 - 10.5 mg/dL 9.9  9.7  9.5   Total Protein 6.0 - 8.3 g/dL 7.2  7.2  7.4    Total Bilirubin 0.2 - 1.2 mg/dL 0.4  0.3  0.3   Alkaline Phos 39 - 117 U/L 88  95  99   AST 0 - 37 U/L 15  13  13    ALT 0 - 35 U/L 17  12  14      Lab Results  Component Value Date   WBC 3.6 (L) 07/15/2023   HGB 13.1 07/15/2023   HCT 40.0 07/15/2023   MCV 94.3 07/15/2023   PLT 269.0 07/15/2023   NEUTROABS 1.6 07/15/2023    ASSESSMENT & PLAN:  Breast cancer of lower-outer quadrant of left female breast 01/20/2011: Left breast cancer stage II invasive lobular with one positive lymph node for micrometastatic disease status post lumpectomy followed by adjuvant chemotherapy with a.c. x4 followed by Taxol x8 and status post radiation therapy. Currently on tamoxifen 20 mg daily started in November 2012 but she stopped taking it May 2015 due to severe hot flashes.    Current treatment: Anastrozole 1 mg daily started May 2017 (when she became postmenopausal), plan to continue for 7 yrs.  She completed her 7 years of therapy and therefore I recommended that she could discontinue antiestrogen therapy at this time.   Anastrozole toxicities: 1. Hot flashes not as severe as tamoxifen 2. mild musculoskeletal discomforts   Breast Cancer Surveillance: 1. Breast exam 09/13/2023: No palpable lumps or nodules of concern 2. Mammogram: 02/23/2023: Benign breast density category B   Severe chemo induced peripheral neuropathy: Currently on gabapentin 400 mg 3 times daily.     Return to clinic on an as-needed basis ------------------------------------- Assessment and Plan    Weight Management Significant weight loss achieved with Ozempic, however, insurance no longer covers the medication. Patient has continued to maintain weight loss through diet and exercise. -Encouraged to continue current lifestyle modifications and set activity goals.  Peripheral Neuropathy Improved with weight loss. -Continue current management strategies.  Breast Cancer Survivorship Cancer diagnosed in 2012, no recent  issues. Mammograms ongoing with category B breast density. -Continue routine mammograms. -Provider available on an as-needed basis for concerns.  General Health Maintenance Discussed the importance of maintaining a healthy lifestyle to prevent future health issues such as heart disease, stroke, and cancer. -Continue current lifestyle modifications.          No orders of the defined types were placed in this encounter.  The patient has a good understanding of the overall plan. she agrees with it. she will call with any problems that may develop before the next visit here. Total time spent: 30 mins including face to face time and time spent for planning, charting and co-ordination of care   Tamsen Meek, MD 09/13/23

## 2023-09-13 NOTE — Assessment & Plan Note (Signed)
01/20/2011: Left breast cancer stage II invasive lobular with one positive lymph node for micrometastatic disease status post lumpectomy followed by adjuvant chemotherapy with a.c. x4 followed by Taxol x8 and status post radiation therapy. Currently on tamoxifen 20 mg daily started in November 2012 but she stopped taking it May 2015 due to severe hot flashes.    Current treatment: Anastrozole 1 mg daily started May 2017 (when she became postmenopausal), plan to continue for 7 yrs.  She completed her 7 years of therapy and therefore I recommended that she could discontinue antiestrogen therapy at this time.   Anastrozole toxicities: 1. Hot flashes not as severe as tamoxifen 2. mild musculoskeletal discomforts   Breast Cancer Surveillance: 1. Breast exam 09/13/2023: No palpable lumps or nodules of concern 2. Mammogram: 02/23/2023: Benign breast density category B   Severe chemo induced peripheral neuropathy: Currently on gabapentin 400 mg 3 times daily.     Return to clinic on an as-needed basis

## 2023-10-24 ENCOUNTER — Other Ambulatory Visit: Payer: Self-pay | Admitting: Family Medicine

## 2023-10-24 DIAGNOSIS — G4700 Insomnia, unspecified: Secondary | ICD-10-CM

## 2023-10-25 NOTE — Telephone Encounter (Signed)
Requested Prescriptions  Pending Prescriptions Disp Refills  zolpidem (AMBIEN) 10 MG tablet [Pharmacy Med Name: ZOLPIDEM TARTRATE 10 MG TABLET] 30 tablet    Sig: Take 1 tablet (10 mg total) by mouth at bedtime. For insomnia   Last refilled 07/15/2023 30 tablets 3 refills

## 2023-10-29 ENCOUNTER — Telehealth: Payer: Self-pay

## 2023-10-29 NOTE — Telephone Encounter (Signed)
Sent to Dr.Tabori.

## 2023-11-02 NOTE — Telephone Encounter (Signed)
Noted  

## 2023-11-02 NOTE — Telephone Encounter (Signed)
Forms reviewed.  No further action needed

## 2023-11-08 DIAGNOSIS — M542 Cervicalgia: Secondary | ICD-10-CM | POA: Insufficient documentation

## 2024-01-06 ENCOUNTER — Other Ambulatory Visit: Payer: Self-pay | Admitting: Hematology and Oncology

## 2024-01-06 DIAGNOSIS — Z1231 Encounter for screening mammogram for malignant neoplasm of breast: Secondary | ICD-10-CM

## 2024-01-30 ENCOUNTER — Other Ambulatory Visit: Payer: Self-pay | Admitting: Family Medicine

## 2024-02-14 ENCOUNTER — Other Ambulatory Visit: Payer: Self-pay | Admitting: Family Medicine

## 2024-02-14 DIAGNOSIS — G4700 Insomnia, unspecified: Secondary | ICD-10-CM

## 2024-02-14 MED ORDER — ZOLPIDEM TARTRATE 10 MG PO TABS: 10.0000 mg | ORAL_TABLET | Freq: Every day | ORAL | 3 refills | Status: DC

## 2024-02-14 NOTE — Telephone Encounter (Signed)
Requested Prescriptions   Pending Prescriptions Disp Refills   zolpidem (AMBIEN) 10 MG tablet 30 tablet 3    Sig: Take 1 tablet (10 mg total) by mouth at bedtime. For insomnia   Signed Prescriptions Disp Refills   zolpidem (AMBIEN) 10 MG tablet 30 tablet 3    Sig: TAKE 1 TABLET (10 MG TOTAL) BY MOUTH AT BEDTIME. FOR INSOMNIA    Authorizing Provider: Sheliah Hatch    Ordering User: Eldred Manges     Date of patient request: 02/14/2024 Last office visit: 07/15/2023 Upcoming visit: 07/17/2024    Mistakenly signed and printed

## 2024-02-15 ENCOUNTER — Telehealth: Payer: Self-pay | Admitting: Family Medicine

## 2024-02-15 NOTE — Telephone Encounter (Addendum)
Caller name: Janiylah Hannis   On DPR?: Yes  Call back number: [phone] (mobile)  Provider they see: Sheliah Hatch, MD  Reason for call: I called patient to inform her that talking about all those other issues she may be charged a fee outside her physical. She said she would make separate appt for those issues.  These were the issues Weight/ management. Vitamin/calcium/B12.etc necessity.

## 2024-02-21 ENCOUNTER — Ambulatory Visit
Admission: RE | Admit: 2024-02-21 | Discharge: 2024-02-21 | Disposition: A | Discharge status: Home / Self Care | Payer: BC Managed Care – PPO | Source: Ambulatory Visit | Attending: Hematology and Oncology

## 2024-02-21 DIAGNOSIS — Z1231 Encounter for screening mammogram for malignant neoplasm of breast: Secondary | ICD-10-CM

## 2024-04-11 ENCOUNTER — Encounter: Payer: Self-pay | Admitting: Obstetrics & Gynecology

## 2024-04-11 ENCOUNTER — Ambulatory Visit: Payer: BC Managed Care – PPO | Admitting: Obstetrics & Gynecology

## 2024-04-11 VITALS — BP 137/82 | HR 72 | Ht 63.0 in

## 2024-04-11 DIAGNOSIS — Z7689 Persons encountering health services in other specified circumstances: Secondary | ICD-10-CM | POA: Diagnosis not present

## 2024-04-11 DIAGNOSIS — Z01419 Encounter for gynecological examination (general) (routine) without abnormal findings: Secondary | ICD-10-CM | POA: Diagnosis not present

## 2024-04-11 NOTE — Progress Notes (Signed)
 GYNECOLOGY OFFICE VISIT NOTE  History:   [name] is a 63 y.o. PMP female with history of TAH in 2005 for benign indications and also history of breast adenocarcinoma s/p lumpectomy, chemotherapy and radiation here today to establish gynecologic care. Was a patient of Dr. Lesta Rater' many years ago, no GYN follow up for several years. Sexually active with husband of 39 years, no concerns.  She denies any abnormal vaginal discharge, bleeding, pelvic pain or other concerns.    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HCC)    Anxiety    Blood transfusion 2004   Breast cancer (HCC) 2012   Left   Depression    Hyperlipidemia    Hypertension    Metabolic syndrome    Obesity    Personal history of chemotherapy    Personal history of radiation therapy    Vitamin D deficiency     Past Surgical History:  Procedure Laterality Date   BREAST LUMPECTOMY Left 2012   BTL     CARPAL TUNNEL RELEASE     PORT-A-CATH REMOVAL  02/19/2012   Procedure: MINOR REMOVAL PORT-A-CATH;  Surgeon: Quitman Bucy, MD;  Location: [facility];  Service: General;  Laterality: Right;   PORT-A-CATH REMOVAL  02/19/2012   Procedure: MINOR REMOVAL PORT-A-CATH;  Surgeon: Quitman Bucy, MD;  Location:  SURGERY CENTER;  Service: General;  Laterality: Right;   TONSILLECTOMY     TOTAL ABDOMINAL HYSTERECTOMY  2005   Ovaries still in place    The following portions of the patient's history were reviewed and updated as appropriate: allergies, current medications, past family history, past medical history, past social history, past surgical history and problem list.   Health Maintenance:  Normal mammogram on 02/21/2024.  Normal Cologuard on 07/29/2023.  Review of Systems:  Pertinent items noted in HPI and remainder of comprehensive ROS otherwise negative.  Physical Exam:  BP 137/82   Pulse 72   Ht 5\' 3"  (1.6 m)   BMI 32.70 kg/m  CONSTITUTIONAL:  Well-developed, well-nourished female in no acute distress.  HEENT:  Normocephalic, atraumatic. External right and left ear normal. No scleral icterus.  NECK: Normal range of motion, supple, no masses noted on observation SKIN: No rash noted. Not diaphoretic. No erythema. No pallor. MUSCULOSKELETAL: Normal range of motion. No edema noted. NEUROLOGIC: Alert and oriented to person, place, and time. Normal muscle tone coordination noted. No cranial nerve deficit noted on observation. PSYCHIATRIC: Normal mood and affect. Normal behavior. Normal judgment and thought content. CARDIOVASCULAR: Normal heart rate noted RESPIRATORY: Effort and breath sounds normal, no problems with respiration noted ABDOMEN: No masses or other overt distention noted on observation. No tenderness.   PELVIC: Deferred by patient.     Assessment and Plan:    1. Encounter to establish gynecologic care (Primary) Welcomed patient to [facility] practice, all questions answered about the practice. She was told she can call us  for any GYN concerns and follow up for her preventative care examinations. She denied having any concerning menopausal symptoms or other concerns at this point.  Routine preventative health maintenance measures emphasized.  Return for any gynecologic concerns.    I spent 30 minutes dedicated to the care of this patient including pre-visit review of records, face to face time with the patient discussing her conditions and treatments, post visit ordering of medications and appropriate tests or procedures, coordinating care and documenting this visit encounter.    Lenoard Rad, MD, FACOG  Obstetrician & Gynecologist, Biochemist, clinical for [employer], [facility]

## 2024-06-28 ENCOUNTER — Ambulatory Visit: Admitting: Family Medicine

## 2024-06-28 ENCOUNTER — Encounter: Payer: Self-pay | Admitting: Family Medicine

## 2024-06-28 DIAGNOSIS — E782 Mixed hyperlipidemia: Secondary | ICD-10-CM | POA: Diagnosis not present

## 2024-06-28 DIAGNOSIS — G4700 Insomnia, unspecified: Secondary | ICD-10-CM | POA: Diagnosis not present

## 2024-06-28 DIAGNOSIS — I10 Essential (primary) hypertension: Secondary | ICD-10-CM | POA: Diagnosis not present

## 2024-06-28 DIAGNOSIS — Z6836 Body mass index (BMI) 36.0-36.9, adult: Secondary | ICD-10-CM | POA: Diagnosis not present

## 2024-06-28 LAB — BASIC METABOLIC PANEL WITH GFR
BUN: 13 mg/dL (ref 6–23)
CO2: 30 meq/L (ref 19–32)
Calcium: 9.8 mg/dL (ref 8.4–10.5)
Chloride: 101 meq/L (ref 96–112)
Creatinine, Ser: 0.79 mg/dL (ref 0.40–1.20)
GFR: 80.39 mL/min
Glucose, Bld: 83 mg/dL (ref 70–99)
Potassium: 3.5 meq/L (ref 3.5–5.1)
Sodium: 140 meq/L (ref 135–145)

## 2024-06-28 LAB — CBC WITH DIFFERENTIAL/PLATELET
Basophils Absolute: 0 10*3/uL (ref 0.0–0.1)
Basophils Relative: 0.9 % (ref 0.0–3.0)
Eosinophils Absolute: 0.1 10*3/uL (ref 0.0–0.7)
Eosinophils Relative: 2.1 % (ref 0.0–5.0)
HCT: 40.2 % (ref 36.0–46.0)
Hemoglobin: 13.4 g/dL (ref 12.0–15.0)
Lymphocytes Relative: 34 % (ref 12.0–46.0)
Lymphs Abs: 1.6 10*3/uL (ref 0.7–4.0)
MCHC: 33.3 g/dL (ref 30.0–36.0)
MCV: 94.1 fl (ref 78.0–100.0)
Monocytes Absolute: 0.4 10*3/uL (ref 0.1–1.0)
Monocytes Relative: 8.5 % (ref 3.0–12.0)
Neutro Abs: 2.6 10*3/uL (ref 1.4–7.7)
Neutrophils Relative %: 54.5 % (ref 43.0–77.0)
Platelets: 284 10*3/uL (ref 150.0–400.0)
RBC: 4.27 Mil/uL (ref 3.87–5.11)
RDW: 13 % (ref 11.5–15.5)
WBC: 4.7 10*3/uL (ref 4.0–10.5)

## 2024-06-28 LAB — HEPATIC FUNCTION PANEL
ALT: 13 U/L (ref 0–35)
AST: 16 U/L (ref 0–37)
Albumin: 4.2 g/dL (ref 3.5–5.2)
Alkaline Phosphatase: 93 U/L (ref 39–117)
Bilirubin, Direct: 0.2 mg/dL (ref 0.0–0.3)
Total Bilirubin: 0.6 mg/dL (ref 0.2–1.2)
Total Protein: 7.5 g/dL (ref 6.0–8.3)

## 2024-06-28 LAB — HEMOGLOBIN A1C: Hgb A1c MFr Bld: 6.1 % (ref 4.6–6.5)

## 2024-06-28 LAB — TSH: TSH: 1.19 u[IU]/mL (ref 0.35–5.50)

## 2024-06-28 LAB — LIPID PANEL
Cholesterol: 177 mg/dL (ref 0–200)
HDL: 55.4 mg/dL
LDL Cholesterol: 104 mg/dL — ABNORMAL HIGH (ref 0–99)
NonHDL: 121.19
Total CHOL/HDL Ratio: 3
Triglycerides: 88 mg/dL (ref 0.0–149.0)
VLDL: 17.6 mg/dL (ref 0.0–40.0)

## 2024-06-28 MED ORDER — ZOLPIDEM TARTRATE 10 MG PO TABS: 10.0000 mg | ORAL_TABLET | Freq: Every day | ORAL | 3 refills | Status: DC

## 2024-06-28 MED ORDER — WEGOVY 0.25 MG/0.5ML ~~LOC~~ SOAJ: 0.2500 mg | SUBCUTANEOUS | 1 refills | Status: DC

## 2024-06-28 NOTE — Patient Instructions (Signed)
 Follow up as needed or as scheduled We'll notify you of your lab results and make any changes if needed We'll try and get your medication approved Continue to work on healthy diet and regular exercise- you can do it!!! Call with any questions or concerns Stay Safe!  Stay Healthy!

## 2024-06-28 NOTE — Progress Notes (Signed)
   Subjective:    Patient ID: [name], female    DOB: 07/31/1961, 63 y.o.   MRN: [id number]  HPI Morbid obesity- pt's BMI 36.  Coupled w/ HTN and hyperlipidemia this qualifies as morbidly obese.  Pt has gained 12 lbs since Sept.  Pt reports she was doing Semaglutide  for 6-7 months and was successful prior to insurance denying medication.  Insurance company was not forthcoming in what they will cover- if anything.    Hyperlipidemia- chronic problem, on Lipitor 20mg  daily.  No abd pain, N/V.  HTN- chronic problem, on Atenolol  chlorthalidone  50/25mg - 1/2 tab daily w/ excellent control.  Pt reports feeling good.  No CP, SOB, HA's, visual changes, edema.   Review of Systems For ROS see HPI     Objective:   Physical Exam Vitals reviewed.  Constitutional:      General: She is not in acute distress.    Appearance: She is well-developed. She is obese. She is not ill-appearing.  HENT:     Head: Normocephalic and atraumatic.  Eyes:     Conjunctiva/sclera: Conjunctivae normal.     Pupils: Pupils are equal, round, and reactive to light.  Neck:     Thyroid : No thyromegaly.  Cardiovascular:     Rate and Rhythm: Normal rate and regular rhythm.     Pulses: Normal pulses.     Heart sounds: Normal heart sounds. No murmur heard. Pulmonary:     Effort: Pulmonary effort is normal. No respiratory distress.     Breath sounds: Normal breath sounds.  Abdominal:     General: There is no distension.     Palpations: Abdomen is soft.     Tenderness: There is no abdominal tenderness.  Musculoskeletal:     Cervical back: Normal range of motion and neck supple.     Right lower leg: No edema.     Left lower leg: No edema.  Lymphadenopathy:     Cervical: No cervical adenopathy.  Skin:    General: Skin is warm and dry.  Neurological:     General: No focal deficit present.     Mental Status: She is alert and oriented to person, place, and time.  Psychiatric:        Mood and Affect: Mood normal.         Behavior: Behavior normal.           Assessment & Plan:

## 2024-06-29 ENCOUNTER — Ambulatory Visit: Payer: Self-pay | Admitting: Family Medicine

## 2024-06-29 NOTE — Progress Notes (Signed)
 Reviewed via [employer]

## 2024-07-04 ENCOUNTER — Encounter: Payer: Self-pay | Admitting: Family Medicine

## 2024-07-05 MED ORDER — PHENTERMINE HCL 37.5 MG PO TABS: 37.5000 mg | ORAL_TABLET | Freq: Every day | ORAL | 0 refills | Status: AC

## 2024-07-05 NOTE — Addendum Note (Signed)
 Addended by: Nikoloz Huy E on: 07/05/2024 07:36 AM   Modules accepted: Orders

## 2024-07-12 NOTE — Assessment & Plan Note (Signed)
 Chronic problem.  On Atenolol  chlorthalidone  w/ good control.  Currently asymptomatic.  Check labs due to chlorthalidone  use but no anticipated med changes.  Will follow.

## 2024-07-12 NOTE — Assessment & Plan Note (Signed)
Chronic problem.  On Lipitor 20mg daily w/o difficulty.  Check labs.  Adjust meds prn  

## 2024-07-12 NOTE — Assessment & Plan Note (Signed)
 Ongoing issue.  Her BMI of 36 coupled w/ HTN and hyperlipidemia qualifies as morbid obesity.  She has gained 12 lbs since Sept.  Was previously losing weight on Semaglutide  but insurance stopped covering medication.  When she called they would not indicate what meds- if any- were covered.  Will send in [employer]  and attempt to get this approved- or determine what is covered.  Encouraged low carb diet and regular exercise.  Will follow.

## 2024-07-17 ENCOUNTER — Encounter: Payer: BC Managed Care – PPO | Admitting: Family Medicine

## 2024-08-13 ENCOUNTER — Other Ambulatory Visit: Payer: Self-pay | Admitting: Family Medicine

## 2024-08-14 ENCOUNTER — Other Ambulatory Visit: Payer: Self-pay | Admitting: Family Medicine

## 2024-08-14 DIAGNOSIS — E559 Vitamin D deficiency, unspecified: Secondary | ICD-10-CM

## 2024-09-15 ENCOUNTER — Encounter: Payer: Self-pay | Admitting: Family Medicine

## 2024-09-15 ENCOUNTER — Ambulatory Visit (INDEPENDENT_AMBULATORY_CARE_PROVIDER_SITE_OTHER): Admitting: Family Medicine

## 2024-09-15 VITALS — BP 120/58 | HR 70 | Temp 98.1°F | Ht 62.6 in | Wt 191.4 lb

## 2024-09-15 DIAGNOSIS — I10 Essential (primary) hypertension: Secondary | ICD-10-CM | POA: Diagnosis not present

## 2024-09-15 DIAGNOSIS — E559 Vitamin D deficiency, unspecified: Secondary | ICD-10-CM | POA: Diagnosis not present

## 2024-09-15 DIAGNOSIS — Z0000 Encounter for general adult medical examination without abnormal findings: Secondary | ICD-10-CM

## 2024-09-15 NOTE — Patient Instructions (Signed)
 Follow up in 6 months to recheck BP and cholesterol We'll notify you of your lab results and make any changes if needed Continue to work on healthy diet and regular exercise- you look great! Call with any questions or concerns Stay Safe!!  Stay Healthy! Hang in there!!

## 2024-09-15 NOTE — Assessment & Plan Note (Signed)
 Pt's PE WNL w/ exception of BMI.  UTD on mammo, cologuard, Tdap.  Declines flu.  Check labs.  Anticipatory guidance provided.

## 2024-09-15 NOTE — Progress Notes (Signed)
   Subjective:    Patient ID: [name], female    DOB: July 17, 1961, 63 y.o.   MRN: [id number]  HPI CPE- UTD on cologuard, mammo, Tdap.  Declines flu  Patient Care Team    Relationship Specialty Notifications Start End  Mahlon Comer BRAVO, MD PCP - General   12/10/10   Rutherford Gain, MD Consulting Physician Obstetrics and Gynecology  05/22/15   Odean Potts, MD Consulting Physician Hematology and Oncology  05/22/15     Health Maintenance  Topic Date Due   Pneumococcal Vaccine: 50+ Years (1 of 1 - PCV) Never done   Colonoscopy  06/27/2024   Influenza Vaccine  07/28/2024   Mammogram  02/20/2025   Fecal DNA (Cologuard)  07/28/2026   DTaP/Tdap/Td (4 - Td or Tdap) 07/01/2032   Hepatitis C Screening  Completed   HIV Screening  Completed   Zoster Vaccines- Shingrix  Completed   Hepatitis B Vaccines 19-59 Average Risk  Aged Out   HPV VACCINES  Aged Out   Meningococcal B Vaccine  Aged Out   COVID-19 Vaccine  Discontinued      Review of Systems Patient reports no vision/ hearing changes, adenopathy,fever, weight change,  persistant/recurrent hoarseness , swallowing issues, chest pain, palpitations, edema, persistant/recurrent cough, hemoptysis, dyspnea (rest/exertional/paroxysmal nocturnal), gastrointestinal bleeding (melena, rectal bleeding), abdominal pain, significant heartburn, bowel changes, GU symptoms (dysuria, hematuria, incontinence), Gyn symptoms (abnormal  bleeding, pain),  syncope, focal weakness, memory loss, skin/hair/nail changes, abnormal bruising or bleeding, anxiety, or depression.   + chemo neuropathy    Objective:   Physical Exam General Appearance:    Alert, cooperative, no distress, appears stated age, obese  Head:    Normocephalic, without obvious abnormality, atraumatic  Eyes:    PERRL, conjunctiva/corneas clear, EOM's intact both eyes  Ears:    Normal TM's and external ear canals, both ears  Nose:   Nares normal, septum midline, mucosa normal, no  drainage    or sinus tenderness  Throat:   Lips, mucosa, and tongue normal; teeth and gums normal  Neck:   Supple, symmetrical, trachea midline, no adenopathy;    Thyroid : no enlargement/tenderness/nodules  Back:     Symmetric, no curvature, ROM normal, no CVA tenderness  Lungs:     Clear to auscultation bilaterally, respirations unlabored  Chest Wall:    No tenderness or deformity   Heart:    Regular rate and rhythm, S1 and S2 normal, no murmur, rub   or gallop  Breast Exam:    Deferred to GYN  Abdomen:     Soft, non-tender, bowel sounds active all four quadrants,    no masses, no organomegaly  Genitalia:    Deferred to GYN  Rectal:    Extremities:   Extremities normal, atraumatic, no cyanosis or edema  Pulses:   2+ and symmetric all extremities  Skin:   Skin color, texture, turgor normal, no rashes or lesions  Lymph nodes:   Cervical, supraclavicular, and axillary nodes normal  Neurologic:   CNII-XII intact, normal strength, sensation and reflexes    throughout          Assessment & Plan:

## 2024-09-16 LAB — CBC WITH DIFFERENTIAL/PLATELET
Absolute Lymphocytes: 2274 {cells}/uL (ref 850–3900)
Absolute Monocytes: 452 {cells}/uL (ref 200–950)
Basophils Absolute: 41 {cells}/uL (ref 0–200)
Basophils Relative: 0.7 %
Eosinophils Absolute: 122 {cells}/uL (ref 15–500)
Eosinophils Relative: 2.1 %
HCT: 38.8 % (ref 35.0–45.0)
Hemoglobin: 12.9 g/dL (ref 11.7–15.5)
MCH: 31.4 pg (ref 27.0–33.0)
MCHC: 33.2 g/dL (ref 32.0–36.0)
MCV: 94.4 fL (ref 80.0–100.0)
MPV: 9.2 fL (ref 7.5–12.5)
Monocytes Relative: 7.8 %
Neutro Abs: 2912 {cells}/uL (ref 1500–7800)
Neutrophils Relative %: 50.2 %
Platelets: 298 Thousand/uL (ref 140–400)
RBC: 4.11 Million/uL (ref 3.80–5.10)
RDW: 12.9 % (ref 11.0–15.0)
Total Lymphocyte: 39.2 %
WBC: 5.8 Thousand/uL (ref 3.8–10.8)

## 2024-09-16 LAB — HEPATIC FUNCTION PANEL
AG Ratio: 1.4 (calc) (ref 1.0–2.5)
ALT: 10 U/L (ref 6–29)
AST: 13 U/L (ref 10–35)
Albumin: 4 g/dL (ref 3.6–5.1)
Alkaline phosphatase (APISO): 98 U/L (ref 37–153)
Bilirubin, Direct: 0.1 mg/dL (ref 0.0–0.2)
Globulin: 2.8 g/dL (ref 1.9–3.7)
Indirect Bilirubin: 0.2 mg/dL (ref 0.2–1.2)
Total Bilirubin: 0.3 mg/dL (ref 0.2–1.2)
Total Protein: 6.8 g/dL (ref 6.1–8.1)

## 2024-09-16 LAB — LIPID PANEL
Cholesterol: 167 mg/dL (ref <200)
HDL: 52 mg/dL (ref >=50)
LDL Cholesterol (Calc): 94 mg/dL
Non-HDL Cholesterol (Calc): 115 mg/dL (ref <130)
Total CHOL/HDL Ratio: 3.2 (calc) (ref <5.0)
Triglycerides: 116 mg/dL (ref <150)

## 2024-09-16 LAB — BASIC METABOLIC PANEL WITH GFR
BUN: 13 mg/dL (ref 7–25)
CO2: 34 mmol/L — ABNORMAL HIGH (ref 20–32)
Calcium: 9.5 mg/dL (ref 8.6–10.4)
Chloride: 101 mmol/L (ref 98–110)
Creat: 0.66 mg/dL (ref 0.50–1.05)
Glucose, Bld: 89 mg/dL (ref 65–99)
Potassium: 3.8 mmol/L (ref 3.5–5.3)
Sodium: 140 mmol/L (ref 135–146)
eGFR: 99 mL/min/1.73m2 (ref >=60)

## 2024-09-16 LAB — TSH: TSH: 0.72 m[IU]/L (ref 0.40–4.50)

## 2024-09-16 LAB — VITAMIN D 25 HYDROXY (VIT D DEFICIENCY, FRACTURES): Vit D, 25-Hydroxy: 95 ng/mL (ref 30–100)

## 2024-09-18 ENCOUNTER — Ambulatory Visit: Payer: Self-pay | Admitting: Family Medicine

## 2024-09-18 NOTE — Progress Notes (Signed)
 Lab results have been discussed.   Verbalized understanding? Yes  Are there any questions? No

## 2024-10-02 ENCOUNTER — Other Ambulatory Visit: Payer: Self-pay | Admitting: Family Medicine

## 2024-10-02 DIAGNOSIS — E559 Vitamin D deficiency, unspecified: Secondary | ICD-10-CM

## 2024-12-01 ENCOUNTER — Other Ambulatory Visit: Payer: Self-pay | Admitting: Family Medicine

## 2024-12-01 DIAGNOSIS — G4700 Insomnia, unspecified: Secondary | ICD-10-CM

## 2024-12-04 NOTE — Telephone Encounter (Signed)
 Requested Prescriptions   Pending Prescriptions Disp Refills   zolpidem  (AMBIEN ) 10 MG tablet [Pharmacy Med Name: ZOLPIDEM  TARTRATE 10 MG TABLET] 30 tablet 3    Sig: Take 1 tablet (10 mg total) by mouth at bedtime. For insomnia     Date of patient request: 12/04/24 Last office visit: 09/15/2024 Upcoming visit: Visit date not found Date of last refill: 06/28/24 Last refill amount: 30 w/ 3 refills

## 2024-12-27 ENCOUNTER — Other Ambulatory Visit: Payer: Self-pay | Admitting: Hematology and Oncology

## 2024-12-27 DIAGNOSIS — Z1231 Encounter for screening mammogram for malignant neoplasm of breast: Secondary | ICD-10-CM

## 2025-02-21 ENCOUNTER — Ambulatory Visit
